# Patient Record
Sex: Male | Born: 1945 | Race: White | Hispanic: No | Marital: Married | Smoking: Former smoker
Health system: Southern US, Community
[De-identification: ages and names within clinical notes are randomized; demographics above are authoritative.]

## PROBLEM LIST (undated history)

## (undated) DIAGNOSIS — G4730 Sleep apnea, unspecified: Secondary | ICD-10-CM

## (undated) DIAGNOSIS — N200 Calculus of kidney: Secondary | ICD-10-CM

## (undated) DIAGNOSIS — N529 Male erectile dysfunction, unspecified: Secondary | ICD-10-CM

## (undated) DIAGNOSIS — E785 Hyperlipidemia, unspecified: Secondary | ICD-10-CM

## (undated) DIAGNOSIS — K635 Polyp of colon: Secondary | ICD-10-CM

## (undated) DIAGNOSIS — I10 Essential (primary) hypertension: Secondary | ICD-10-CM

## (undated) DIAGNOSIS — T7840XA Allergy, unspecified, initial encounter: Secondary | ICD-10-CM

## (undated) DIAGNOSIS — T148XXA Other injury of unspecified body region, initial encounter: Secondary | ICD-10-CM

## (undated) DIAGNOSIS — M7501 Adhesive capsulitis of right shoulder: Secondary | ICD-10-CM

## (undated) DIAGNOSIS — Z8619 Personal history of other infectious and parasitic diseases: Secondary | ICD-10-CM

## (undated) HISTORY — DX: Essential (primary) hypertension: I10

## (undated) HISTORY — DX: Adhesive capsulitis of right shoulder: M75.01

## (undated) HISTORY — DX: Polyp of colon: K63.5

## (undated) HISTORY — DX: Personal history of other infectious and parasitic diseases: Z86.19

## (undated) HISTORY — DX: Calculus of kidney: N20.0

## (undated) HISTORY — DX: Sleep apnea, unspecified: G47.30

## (undated) HISTORY — DX: Allergy, unspecified, initial encounter: T78.40XA

## (undated) HISTORY — DX: Other injury of unspecified body region, initial encounter: T14.8XXA

## (undated) HISTORY — DX: Male erectile dysfunction, unspecified: N52.9

## (undated) HISTORY — DX: Hyperlipidemia, unspecified: E78.5

---
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
## 2000-06-30 ENCOUNTER — Other Ambulatory Visit: Admission: RE | Admit: 2000-06-30 | Discharge: 2000-06-30 | Payer: Self-pay | Admitting: Internal Medicine

## 2000-09-12 ENCOUNTER — Encounter: Admission: RE | Admit: 2000-09-12 | Discharge: 2000-12-11 | Payer: Self-pay | Admitting: Internal Medicine

## 2000-09-26 ENCOUNTER — Ambulatory Visit (HOSPITAL_COMMUNITY): Admission: RE | Admit: 2000-09-26 | Discharge: 2000-09-26 | Payer: Self-pay | Admitting: Internal Medicine

## 2002-10-26 ENCOUNTER — Other Ambulatory Visit: Admission: RE | Admit: 2002-10-26 | Discharge: 2002-10-26 | Payer: Self-pay | Admitting: Family Medicine

## 2006-03-23 ENCOUNTER — Ambulatory Visit: Payer: Self-pay | Admitting: Internal Medicine

## 2006-03-30 ENCOUNTER — Ambulatory Visit: Payer: Self-pay | Admitting: Internal Medicine

## 2006-03-30 LAB — HM COLONOSCOPY: HM Colonoscopy: NORMAL

## 2010-06-07 HISTORY — PX: ROTATOR CUFF REPAIR: SHX139

## 2010-07-07 LAB — BASIC METABOLIC PANEL
Chloride: 102 mEq/L (ref 96–112)
GFR calc Af Amer: >60 mL/min (ref >60)
Potassium: 4.5 mEq/L (ref 3.5–5.1)
Sodium: 138 mEq/L (ref 135–145)

## 2010-07-10 ENCOUNTER — Ambulatory Visit (HOSPITAL_BASED_OUTPATIENT_CLINIC_OR_DEPARTMENT_OTHER)
Admission: RE | Admit: 2010-07-10 | Discharge: 2010-07-10 | Disposition: A | Discharge status: Home / Self Care | Payer: BC Managed Care – PPO | Attending: Orthopedic Surgery | Admitting: Orthopedic Surgery

## 2010-07-10 DIAGNOSIS — E119 Type 2 diabetes mellitus without complications: Secondary | ICD-10-CM | POA: Insufficient documentation

## 2010-07-10 DIAGNOSIS — G4733 Obstructive sleep apnea (adult) (pediatric): Secondary | ICD-10-CM | POA: Insufficient documentation

## 2010-07-10 DIAGNOSIS — M7500 Adhesive capsulitis of unspecified shoulder: Secondary | ICD-10-CM | POA: Insufficient documentation

## 2010-07-10 DIAGNOSIS — M67919 Unspecified disorder of synovium and tendon, unspecified shoulder: Secondary | ICD-10-CM | POA: Insufficient documentation

## 2010-07-10 DIAGNOSIS — Z01810 Encounter for preprocedural cardiovascular examination: Secondary | ICD-10-CM | POA: Insufficient documentation

## 2010-07-10 DIAGNOSIS — M719 Bursopathy, unspecified: Secondary | ICD-10-CM | POA: Insufficient documentation

## 2010-07-10 DIAGNOSIS — Z01812 Encounter for preprocedural laboratory examination: Secondary | ICD-10-CM | POA: Insufficient documentation

## 2010-07-10 DIAGNOSIS — I10 Essential (primary) hypertension: Secondary | ICD-10-CM | POA: Insufficient documentation

## 2010-07-10 DIAGNOSIS — M25819 Other specified joint disorders, unspecified shoulder: Secondary | ICD-10-CM | POA: Insufficient documentation

## 2010-07-13 LAB — GLUCOSE, CAPILLARY
Glucose-Capillary: 150 mg/dL — ABNORMAL HIGH (ref 70–99)
Glucose-Capillary: 139 mg/dL — ABNORMAL HIGH (ref 70–99)

## 2010-07-13 LAB — POCT HEMOGLOBIN-HEMACUE: Hemoglobin: 13.8 g/dL (ref 13.0–17.0)

## 2010-07-21 NOTE — Op Note (Signed)
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  POSTOPERATIVE DIAGNOSES:  Right shoulder adhesive capsulitis, impingement syndrome, high-grade rotator cuff tendinopathy with 85% full- thickness tear of the leading edges of supraspinatus, diffuse labral fraying, anteriorly and superiorly.  OPERATIVE PROCEDURE:  Right shoulder arthroscopy with rotator cuff repair with acromioplasty and debridement of the labrum and manipulation under anesthesia.  PREOPERATIVE INDICATIONS:  [name] is a 65 year old gentleman who had right shoulder pain with limited motion.  He had multiple injections and prolonged rehabilitation with ongoing symptoms.  He elected to undergo the above-named procedures.  The risks, benefits, and alternatives were discussed before the procedure including but not limited to risks of infection, bleeding, nerve injury, recurrent stiffness, recurrent rotator cuff tearing, ongoing weakness, incomplete relief of symptoms, cardiopulmonary complications, among others and he is willing to proceed.  OPERATIVE FINDINGS:  The glenohumeral articular cartilage was essentially normal.  The biceps had some fraying, but was overall intact.  The anterior and superior labrum had substantial fraying.  The anterior leading edge of the supraspinatus had fairly significant degenerative changes with very poor tissue quality and 85% full- thickness tearing in a small portion.  The undersurface of the CA ligament was very frayed and had evidence for wear.   Diffusely, there was synovitis and erythema and injection of the capsule consistent with adhesive capsulitis.  Exam under anesthesia demonstrated forward flexion to 160 degrees, and after manipulation, I could get him up to 180 degrees.  I had palpable and audible lysis of adhesions.  External rotation after manipulation was to 80 degrees, and prior was to 30 degrees.  I could also internally rotate him to 80 degrees with the arm abducted to 90 degrees.  OPERATIVE IMPLANTS:  I used an Arthrex PEEK 5.5-mm SwiveLock x1.  OPERATIVE PROCEDURE:  The patient was brought to the operating room and placed in a supine position.  IV antibiotics were given.  General anesthesia was administered.  Manipulation under anesthesia was performed with the above-named findings.  Diagnostic arthroscopy was carried out with the above-named findings.  The arthroscopic shaver was used to debride the labrum anteriorly as well as superiorly as well as to debride the undersurface of the anterior supraspinatus.  The posterior supraspinatus also looked like fairly significant tendinopathy, although this was very well attached.  I then turned my attention to subacromial space.  Complete bursectomy was performed, and he had a type 2 acromion that I smoothened with a bur.  CA ligament was released.  After completing the bursectomy, I exposed the tuberosity as well as the tear.  I had previously marked the tear with a PDS suture.  I had to take down  a small veil of tissue from the superior side and once the tear had been exposed I placed appropriate cannulas and then used an inverted mattress FiberTape as well as a single FiberLink as a third strand in order to prevent dog- ear.  I then punched and placed the anchor.  The anchor was placed to the lateral portal, and had a slightly more parallel than usual angle of the tack, however, it had excellent bone quality and fixation and the cuff was well apposed down to bone.   I then irrigated the shoulder and removed the arthroscopic instruments and closed the portals with Monocryl followed by Steri-Strips and sterile gauze.  He was awakened and returned to PACU in stable and satisfactory condition.  He did have a preoperative block and I did also inject his posterior portal for additional pain control.  There were no complications.  He tolerated the procedure well.     Eulas Post, MD     JPL/MEDQ  D:  07/10/2010  T:  07/11/2010  Job:  [id number]  Electronically Signed by Teryl Lucy MD on 07/21/2010 11:53:58 AM

## 2010-10-01 ENCOUNTER — Encounter (INDEPENDENT_AMBULATORY_CARE_PROVIDER_SITE_OTHER): Payer: PRIVATE HEALTH INSURANCE | Admitting: Family Medicine

## 2010-10-01 DIAGNOSIS — Z79899 Other long term (current) drug therapy: Secondary | ICD-10-CM

## 2010-10-01 DIAGNOSIS — E782 Mixed hyperlipidemia: Secondary | ICD-10-CM

## 2010-10-01 DIAGNOSIS — Z0000 Encounter for general adult medical examination without abnormal findings: Secondary | ICD-10-CM

## 2010-10-01 DIAGNOSIS — Z125 Encounter for screening for malignant neoplasm of prostate: Secondary | ICD-10-CM

## 2010-10-01 DIAGNOSIS — I10 Essential (primary) hypertension: Secondary | ICD-10-CM

## 2010-10-28 ENCOUNTER — Ambulatory Visit: Payer: PRIVATE HEALTH INSURANCE | Admitting: Family Medicine

## 2010-10-30 ENCOUNTER — Encounter: Payer: Self-pay | Admitting: *Deleted

## 2010-11-05 ENCOUNTER — Encounter: Payer: Self-pay | Admitting: Family Medicine

## 2010-11-05 ENCOUNTER — Ambulatory Visit (INDEPENDENT_AMBULATORY_CARE_PROVIDER_SITE_OTHER): Payer: PRIVATE HEALTH INSURANCE | Admitting: Family Medicine

## 2010-11-05 VITALS — BP 124/62 | HR 72 | Ht 68.0 in | Wt 199.0 lb

## 2010-11-05 DIAGNOSIS — E7800 Pure hypercholesterolemia, unspecified: Secondary | ICD-10-CM | POA: Insufficient documentation

## 2010-11-05 DIAGNOSIS — I10 Essential (primary) hypertension: Secondary | ICD-10-CM | POA: Insufficient documentation

## 2010-11-05 MED ORDER — AMLODIPINE BESYLATE 5 MG PO TABS: 5.0000 mg | ORAL_TABLET | Freq: Every day | ORAL | Status: DC

## 2010-11-05 NOTE — Patient Instructions (Signed)
Continue monitoring BP.  If consistently <110 systolic, then may decrease to 1/2 tablet of the amlodipine.  If periodically BP is over 130, then do not decrease.  Goal BP is always to be <130/80

## 2010-11-05 NOTE — Progress Notes (Signed)
Subjective:    Patient ID: [name], male    DOB: 08-20-1945, 65 y.o.   MRN: [id number]  HPI Patient presents for follow up on hypertension. Amlodipine 5mg  was added to his Losartan HCT at his last visit due to his BP's being poorly controlled.  BP's have been 130/70 when he checks it, although lower at endocrinologist office.  A1c was 5.9 and BP was 105/65 at Dr. Daune Perch office last week.  He ran out of medicine 3-4 days ago.  Denies dizziness, headaches, fatigue, edema or constipation.  He recently moved, and is much less stressed now.  Exercises every morning.  Past Medical History  Diagnosis Date  . Diabetes mellitus   . Hypertension   . Hyperlipidemia elevated chol/TG  . Sleep apnea on CPAP  . Allergy   . Erectile dysfunction   . Kidney stones 8/07 DrKimbrough  . History of shingles mid 90's  . Adhesive capsulitis of right shoulder     Past Surgical History  Procedure Date  . Tonsillectomy and adenoidectomy as a child  . Rotator cuff repair right 07/2010    History   Social History  . Marital Status: Single    Spouse Name: N/A    Number of Children: N/A  . Years of Education: N/A   Occupational History  . Not on file.   Social History Main Topics  . Smoking status: Former Smoker    Quit date: 10/01/1986  . Smokeless tobacco: Not on file  . Alcohol Use: Yes     daily, 2 drinks, 1 scotch and 1 glass wine  . Drug Use: No  . Sexually Active: Not on file   Other Topics Concern  . Not on file   Social History Narrative  . No narrative on file    Family History  Problem Relation Age of Onset  . Diabetes Mother   . Hyperlipidemia Mother   . Hypertension Mother   . Alzheimer's disease Mother   . Diabetes Father   . Hypertension Father   . Hyperlipidemia Brother   . Hypertension Maternal Grandmother   . Stroke Maternal Grandmother   . Heart attack Maternal Grandfather   . Prostate cancer Paternal Grandfather   . Hypertension Brother     Current  outpatient prescriptions:amLODipine (NORVASC) 5 MG tablet, Take 1 tablet (5 mg total) by mouth daily., Disp: 90 tablet, Rfl: 1;  aspirin 81 MG tablet, Take 81 mg by mouth daily.  , Disp: , Rfl: ;  fenofibrate (TRICOR) 145 MG tablet, Take 145 mg by mouth daily.  , Disp: , Rfl: ;  insulin aspart (NOVOLOG) 100 UNIT/ML injection, Inject 16 Units into the skin 2 (two) times daily.  , Disp: , Rfl:  insulin NPH (HUMULIN N,NOVOLIN N) 100 UNIT/ML injection, Inject 32 Units into the skin 2 (two) times daily.  , Disp: , Rfl: ;  losartan-hydrochlorothiazide (HYZAAR) 100-12.5 MG per tablet, Take 1 tablet by mouth daily.  , Disp: , Rfl: ;  metFORMIN (GLUCOPHAGE) 1000 MG tablet, Take 1,000 mg by mouth 2 (two) times daily with a meal.  , Disp: , Rfl: ;  rosuvastatin (CRESTOR) 10 MG tablet, Take 10 mg by mouth daily.  , Disp: , Rfl:  vardenafil (LEVITRA) 20 MG tablet, Take 20 mg by mouth daily as needed.  , Disp: , Rfl: ;  DISCONTD: amLODipine (NORVASC) 5 MG tablet, Take 5 mg by mouth daily.  , Disp: , Rfl: ;  DISCONTD: losartan-hydrochlorothiazide (HYZAAR) 100-12.5 MG per tablet, Take 1  tablet by mouth daily.  , Disp: , Rfl: ;  DISCONTD: valsartan-hydrochlorothiazide (DIOVAN-HCT) 160-12.5 MG per tablet, Take 1 tablet by mouth daily.  , Disp: , Rfl:   No Known Allergies  Review of Systems Denies headaches, dizziness, chest pain, fever, URI symptoms, SOB, edema, GI changes or other concerns    Objective:   Physical Exam BP 124/62  Pulse 72  Ht 5\' 8"  (1.727 m)  Wt 199 lb (90.266 kg)  BMI 30.26 kg/m2 Pleasant, well developed male in no distress Heart:  Regular rate and rhythm without murmurs Lungs:  Clear bilaterally Extremities:  No edema     Assessment & Plan:   1. Essential hypertension, benign  amLODipine (NORVASC) 5 MG tablet   improved control with addition of amlodipine.  Goal <130/80.  If consistently <110 systolic, can try cutting amlodipine dose in 1/2  2. Pure hypercholesterolemia  Hepatic function  panel, Lipid panel   Follow up in 5 months (6 months from last labs) with labs prior for lipids and HTN

## 2011-04-07 ENCOUNTER — Other Ambulatory Visit: Payer: PRIVATE HEALTH INSURANCE

## 2011-04-07 DIAGNOSIS — E7800 Pure hypercholesterolemia, unspecified: Secondary | ICD-10-CM

## 2011-04-07 LAB — LIPID PANEL
HDL: 34 mg/dL — ABNORMAL LOW (ref >39)
Triglycerides: 123 mg/dL (ref <150)

## 2011-04-07 LAB — HEPATIC FUNCTION PANEL
Albumin: 4.4 g/dL (ref 3.5–5.2)
Alkaline Phosphatase: 30 U/L — ABNORMAL LOW (ref 39–117)
Total Protein: 6.2 g/dL (ref 6.0–8.3)

## 2011-04-15 ENCOUNTER — Ambulatory Visit (INDEPENDENT_AMBULATORY_CARE_PROVIDER_SITE_OTHER): Payer: PRIVATE HEALTH INSURANCE | Admitting: Family Medicine

## 2011-04-15 ENCOUNTER — Encounter: Payer: Self-pay | Admitting: Family Medicine

## 2011-04-15 VITALS — BP 110/68 | HR 64 | Ht 67.0 in | Wt 204.0 lb

## 2011-04-15 DIAGNOSIS — N529 Male erectile dysfunction, unspecified: Secondary | ICD-10-CM | POA: Insufficient documentation

## 2011-04-15 DIAGNOSIS — E1165 Type 2 diabetes mellitus with hyperglycemia: Secondary | ICD-10-CM | POA: Insufficient documentation

## 2011-04-15 DIAGNOSIS — E7800 Pure hypercholesterolemia, unspecified: Secondary | ICD-10-CM

## 2011-04-15 DIAGNOSIS — Z9989 Dependence on other enabling machines and devices: Secondary | ICD-10-CM | POA: Insufficient documentation

## 2011-04-15 DIAGNOSIS — I10 Essential (primary) hypertension: Secondary | ICD-10-CM

## 2011-04-15 DIAGNOSIS — E119 Type 2 diabetes mellitus without complications: Secondary | ICD-10-CM | POA: Insufficient documentation

## 2011-04-15 DIAGNOSIS — Z23 Encounter for immunization: Secondary | ICD-10-CM

## 2011-04-15 DIAGNOSIS — G4733 Obstructive sleep apnea (adult) (pediatric): Secondary | ICD-10-CM

## 2011-04-15 MED ORDER — TADALAFIL 5 MG PO TABS: 5.0000 mg | ORAL_TABLET | Freq: Every day | ORAL | Status: AC | PRN

## 2011-04-15 MED ORDER — SILDENAFIL CITRATE 100 MG PO TABS: 100.0000 mg | ORAL_TABLET | ORAL | Status: DC | PRN

## 2011-04-15 NOTE — Patient Instructions (Addendum)
Discussed options for borderline cholesterol--Decided to continue with current regimen and re-check in 6 months. If HDL still low, ratio elevated (>4), consider switching from Tricor to Niaspan (and continue Crestor).  Re-check labs a month later, and make adjustments if needed.  Erectile dysfunction:   If medications aren't effective in treating ED, consider referral to urologist to discuss other options.

## 2011-04-15 NOTE — Progress Notes (Signed)
Patient presents for 6 month f/u on hypertension and lipids.  Had labs done last week.  Hyperlipidemia:  Patient is compliant with medications.  Denies side effects.  Walks 3-4 miles five days/week.  Doesn't tolerate fish oil due to indigestion.  Hypertension follow-up:  Blood pressures elsewhere are 110/70 at pharmacy.  Denies dizziness, headaches, chest pain.  Denies side effects of medications.  DM--is monitored by Dr. Sharl Ma.  Has appointment next week.  Last A1c was 5.8  Erectile dysfunction--Levitra doesn't seem to be working.  Taking a full pill 1-1.5 hours prior, and not getting much response.  Hasn't tried Cialis or Viagra.  Concern for heart disease.  Got newsletter from [facility] stating that ED can sometimes precede heart disease by a few years.  He is wondering if he needs stress tests.  Previously seen at [facility], and had normal stress test.  This was approximately 5 years ago.  He is very active, and denies any dyspnea on exertion, chest pain or other changes with exercise.  In fact, he has increased his exercise without problems.  Sleep apnea:  His machine is 64 years old.  Compliant with CPAP use.  Looking into new machine.  Needs prescription for [facility].  Past Medical History  Diagnosis Date  . Diabetes mellitus   . Hypertension   . Hyperlipidemia elevated chol/TG  . Sleep apnea on CPAP  . Allergy   . Erectile dysfunction   . Kidney stones 8/07 DrKimbrough  . History of shingles mid 90's  . Adhesive capsulitis of right shoulder     Past Surgical History  Procedure Date  . Tonsillectomy and adenoidectomy as a child  . Rotator cuff repair right 07/2010    History   Social History  . Marital Status: Single    Spouse Name: N/A    Number of Children: N/A  . Years of Education: N/A   Occupational History  . Not on file.   Social History Main Topics  . Smoking status: Former Smoker    Quit date: 10/01/1986  . Smokeless  tobacco: Not on file  . Alcohol Use: Yes     daily, 2 drinks, 1 scotch and 1 glass wine  . Drug Use: No  . Sexually Active: Not on file   Other Topics Concern  . Not on file   Social History Narrative  . No narrative on file    Family History  Problem Relation Age of Onset  . Diabetes Mother   . Hyperlipidemia Mother   . Hypertension Mother   . Alzheimer's disease Mother   . Diabetes Father   . Hypertension Father   . Hyperlipidemia Brother   . Hypertension Maternal Grandmother   . Stroke Maternal Grandmother   . Heart attack Maternal Grandfather   . Prostate cancer Paternal Grandfather   . Hypertension Brother    Current Outpatient Prescriptions on File Prior to Visit  Medication Sig Dispense Refill  . amLODipine (NORVASC) 5 MG tablet Take 1 tablet (5 mg total) by mouth daily.  90 tablet  1  . aspirin 81 MG tablet Take 81 mg by mouth daily.        . fenofibrate (TRICOR) 145 MG tablet Take 145 mg by mouth daily.        . insulin aspart (NOVOLOG) 100 UNIT/ML injection Inject 16 Units into the skin 2 (two) times daily.        . insulin NPH (HUMULIN N,NOVOLIN N) 100 UNIT/ML injection Inject 32  Units into the skin 2 (two) times daily.        Marland Kitchen losartan-hydrochlorothiazide (HYZAAR) 100-12.5 MG per tablet Take 1 tablet by mouth daily.        . metFORMIN (GLUCOPHAGE) 1000 MG tablet Take 1,000 mg by mouth 2 (two) times daily with a meal.        . rosuvastatin (CRESTOR) 10 MG tablet Take 10 mg by mouth daily.        . vardenafil (LEVITRA) 20 MG tablet Take 20 mg by mouth daily as needed.         No Known Allergies  ROS:  Denies fevers, URI symptoms, cough, shortness of breath, chest pain, dizziness, GI complaints or other concerns.  See HPI  PHYSICAL EXAM: BP 110/68  Pulse 64  Ht 5\' 7"  (1.702 m)  Wt 204 lb (92.534 kg)  BMI 31.95 kg/m2 Well developed, pleasant male in no distress Neck: no lymphadenopathy, thyromegaly or carotid bruit Heart: regular rate and rhythm without  murmur Lungs: clear bilaterally Extremities: no edema Skin: no rash  Lab Results  Component Value Date   CHOL 136 04/07/2011   HDL 34* 04/07/2011   LDLCALC 77 04/07/2011   TRIG 123 04/07/2011   CHOLHDL 4.0 04/07/2011   LFT's were normal  ASSESSMENT/PLAN:  1. Pure hypercholesterolemia     low HDL, borderline chol/HDL ratio.   2. Need for prophylactic vaccination and inoculation against influenza  Flu vaccine greater than or equal to 3yo preservative free IM  3. Type II or unspecified type diabetes mellitus without mention of complication, not stated as uncontrolled     controlled.  Managed by Dr. Sharl Ma.  Patient was given copies of labs to show him at visit next week  4. Erectile dysfunction  tadalafil (CIALIS) 5 MG tablet, sildenafil (VIAGRA) 100 MG tablet   No response to Levitra.  Given samples of Viagra and Rx with voucher for trial of Cialis.  Patient can call for Rx for whichever is most effective  5. OSA on CPAP     Rx written for new CPAP machine and supplies  6. Essential hypertension, benign     well controlled   Discussed options for borderline cholesterol--Decided to continue with current regimen and re-check in 6 months. If HDL still low, ratio elevated (>4), consider switching from Tricor to Niaspan (and continue Crestor).  Re-check labs a month later, and make adjustments if needed  HTN -- well controlled  Discussed his risk for heart disease, all of which are being adequately controlled.  With lack of exertional symptoms or other concern, I don't feel he needs a routine followup stress test.  Discussed signs/symptoms of angina, and to call immediately should he have more problems with exercise  Sleep apnea.  Rx written for CPAP machine and supplies   ED--trial of other medications.  If medications aren't effective in treating ED, consider referral to urologist to discuss other options.  F/u 6 months at CPE

## 2011-04-19 DIAGNOSIS — Z23 Encounter for immunization: Secondary | ICD-10-CM

## 2011-05-13 ENCOUNTER — Encounter: Payer: Self-pay | Admitting: Internal Medicine

## 2011-05-17 ENCOUNTER — Telehealth: Payer: Self-pay | Admitting: *Deleted

## 2011-05-17 DIAGNOSIS — N529 Male erectile dysfunction, unspecified: Secondary | ICD-10-CM

## 2011-05-17 MED ORDER — SILDENAFIL CITRATE 100 MG PO TABS: ORAL_TABLET | ORAL | Status: DC

## 2011-05-17 NOTE — Telephone Encounter (Signed)
Patient called and stated he was given sample of Viagra and would like an rx called into [facility] [address].

## 2011-05-17 NOTE — Telephone Encounter (Signed)
E-rxd

## 2011-06-09 ENCOUNTER — Telehealth: Payer: Self-pay | Admitting: Internal Medicine

## 2011-06-09 DIAGNOSIS — I10 Essential (primary) hypertension: Secondary | ICD-10-CM

## 2011-06-09 MED ORDER — AMLODIPINE BESYLATE 5 MG PO TABS: 5.0000 mg | ORAL_TABLET | Freq: Every day | ORAL | Status: DC

## 2011-06-09 NOTE — Telephone Encounter (Signed)
Sent med in 

## 2011-06-23 ENCOUNTER — Ambulatory Visit (AMBULATORY_SURGERY_CENTER): Payer: PRIVATE HEALTH INSURANCE | Admitting: *Deleted

## 2011-06-23 VITALS — Ht 68.0 in | Wt 205.3 lb

## 2011-06-23 DIAGNOSIS — Z1211 Encounter for screening for malignant neoplasm of colon: Secondary | ICD-10-CM

## 2011-06-23 MED ORDER — PEG-KCL-NACL-NASULF-NA ASC-C 100 G PO SOLR: ORAL | Status: DC

## 2011-06-30 ENCOUNTER — Telehealth: Payer: Self-pay | Admitting: Internal Medicine

## 2011-06-30 DIAGNOSIS — I10 Essential (primary) hypertension: Secondary | ICD-10-CM

## 2011-06-30 DIAGNOSIS — E7800 Pure hypercholesterolemia, unspecified: Secondary | ICD-10-CM

## 2011-06-30 MED ORDER — LOSARTAN POTASSIUM-HCTZ 100-12.5 MG PO TABS: 1.0000 | ORAL_TABLET | Freq: Every day | ORAL | Status: DC

## 2011-06-30 MED ORDER — ROSUVASTATIN CALCIUM 10 MG PO TABS: 10.0000 mg | ORAL_TABLET | Freq: Every day | ORAL | Status: DC

## 2011-06-30 NOTE — Telephone Encounter (Signed)
E-rxd

## 2011-07-07 ENCOUNTER — Ambulatory Visit (AMBULATORY_SURGERY_CENTER): Payer: PRIVATE HEALTH INSURANCE | Admitting: Internal Medicine

## 2011-07-07 ENCOUNTER — Encounter: Payer: Self-pay | Admitting: Internal Medicine

## 2011-07-07 VITALS — BP 145/86 | HR 61 | Temp 97.7°F | Resp 15 | Ht 68.0 in | Wt 205.0 lb

## 2011-07-07 DIAGNOSIS — D126 Benign neoplasm of colon, unspecified: Secondary | ICD-10-CM

## 2011-07-07 DIAGNOSIS — K635 Polyp of colon: Secondary | ICD-10-CM

## 2011-07-07 DIAGNOSIS — Z86010 Personal history of colonic polyps: Secondary | ICD-10-CM

## 2011-07-07 DIAGNOSIS — Z1211 Encounter for screening for malignant neoplasm of colon: Secondary | ICD-10-CM

## 2011-07-07 HISTORY — DX: Polyp of colon: K63.5

## 2011-07-07 LAB — GLUCOSE, CAPILLARY
Glucose-Capillary: 121 mg/dL — ABNORMAL HIGH (ref 70–99)
Glucose-Capillary: 134 mg/dL — ABNORMAL HIGH (ref 70–99)

## 2011-07-07 MED ORDER — SODIUM CHLORIDE 0.9 % IV SOLN: 500.0000 mL | INTRAVENOUS | Status: DC

## 2011-07-07 NOTE — Progress Notes (Signed)
Propofol administered continuously during procedure per [facility] camp crna. See scanned intra procedure report. ewm

## 2011-07-07 NOTE — Op Note (Signed)
[facility] [address] [place], [place]  [address]  COLONOSCOPY PROCEDURE REPORT  PATIENT:  [name], [name]  MR#:  [id number] BIRTHDATE:  [birth year], 65 yrs. old  GENDER:  male ENDOSCOPIST:  Hedwig Morton. Juanda Chance, MD REF. BY:  Joselyn Arrow, M.D. PROCEDURE DATE:  07/07/2011 PROCEDURE:  Colonoscopy with biopsy and snare polypectomy, Colonoscopy with snare polypectomy ASA CLASS:  Class II INDICATIONS:  hyperplastic polyp in 2002, adenomatous polyps in 2007 MEDICATIONS:   MAC sedation, administered by CRNA, propofol (Diprivan) 450 mg  DESCRIPTION OF PROCEDURE:   After the risks and benefits and of the procedure were explained, informed consent was obtained. Digital rectal exam was performed and revealed no rectal masses. The LB CF-[id number] endoscope was introduced through the anus and advanced to the cecum, which was identified by both the appendix and ileocecal valve.  The quality of the prep was good, using MoviPrep.  The instrument was then slowly withdrawn as the colon was fully examined. <<PROCEDUREIMAGES>>  FINDINGS:  There were multiple polyps identified and removed. throughout the colon. 6 polyps, 3mm to 10 mm, pedunculated polyp close to the cecum The polyp was removed using cold biopsy forceps. Polyps were snared without cautery. Retrieval was successful (see image2, image5, image6, and image7). snare polyp This was otherwise a normal examination of the colon (see image8, image4, image3, and image2).   Retroflexed views in the rectum revealed no abnormalities.    The scope was then withdrawn from the patient and the procedure completed.  COMPLICATIONS:  None ENDOSCOPIC IMPRESSION: 1) Polyps, multiple throughout the colon 2) Otherwise normal examination RECOMMENDATIONS: 1) Await pathology results 2) High fiber diet. hold ASA for 2 weeks  REPEAT EXAM:  In 5 year(s) for.  ______________________________ Hedwig Morton. Juanda Chance, MD  CC:  n. eSIGNED:   Hedwig Morton.  Amzie Sillas at 07/07/2011 08:39 AM  Lynnda Child, [phone]

## 2011-07-07 NOTE — Progress Notes (Signed)
Patient did not experience any of the following events: a burn prior to discharge; a fall within the facility; wrong site/side/patient/procedure/implant event; or a hospital transfer or hospital admission upon discharge from the facility. (G8907) Patient did not have preoperative order for IV antibiotic SSI prophylaxis. (G8918)  

## 2011-07-07 NOTE — Patient Instructions (Signed)
Discharge instructions given with verbal understanding. Handout on polyps given. Resume previous medications. Hold aspsirin for two weeks.

## 2011-07-08 ENCOUNTER — Telehealth: Payer: Self-pay | Admitting: *Deleted

## 2011-07-08 NOTE — Telephone Encounter (Signed)
Left message per permission given in recovery yesterday on number given.ewm

## 2011-07-12 ENCOUNTER — Encounter: Payer: Self-pay | Admitting: Family Medicine

## 2011-07-12 ENCOUNTER — Encounter: Payer: Self-pay | Admitting: Internal Medicine

## 2011-07-12 DIAGNOSIS — D126 Benign neoplasm of colon, unspecified: Secondary | ICD-10-CM | POA: Insufficient documentation

## 2011-07-13 ENCOUNTER — Telehealth: Payer: Self-pay | Admitting: Family Medicine

## 2011-07-13 ENCOUNTER — Encounter: Payer: Self-pay | Admitting: *Deleted

## 2011-07-13 NOTE — Telephone Encounter (Signed)
Pt came in with a form from his insurance company requesting that his crestor be changed to atorvastatin tab 40 mg. Pt uses [facility].  Form sent back with pt's chart.

## 2011-07-14 ENCOUNTER — Telehealth: Payer: Self-pay | Admitting: *Deleted

## 2011-07-14 NOTE — Telephone Encounter (Signed)
Advise pt--form reviewed.  This was only a suggestion by his insurance, to help him save money, not that they weren't going to cover his Crestor anymore.  If he has a copay card for Crestor, the difference likely is copay of $10 for lipitor generic, vs probably $25 for Crestor (depending on his insurance and the copay card, with form stating his current copay is $40).  We do know that the combination of his current lipid lowering meds is working well.  There is some increased risks of side effects and liver toxicity using the high dose of tricor along with statins, but I've been comfortable with this knowing that he is on low dose Crestor, and LFT's have been normal.  There is slightly higher risk going to the higher dose of lipitor to be equivalent with what he is getting.  I recommend leaving things as is, but if he would like to change for cost purposes, then can change from Crestor 10 to Lipitor 40, but would need LFT's and lipid panel done in 8 weeks to ensure that it is working adequately without causing harm

## 2011-07-14 NOTE — Telephone Encounter (Signed)
Spoke with patient and he would like to stay on Crestor, he will come to pick up copay card. He will speak further with you at his CPE in 10/2011. He is swtiching over to Medicare and that is why he needed to change, the copay card is not valid with Medicaid or Medicare. He said he will use it in the time being until he sees you in May. Just an FYI.

## 2011-07-15 ENCOUNTER — Telehealth: Payer: Self-pay | Admitting: Internal Medicine

## 2011-07-15 DIAGNOSIS — E782 Mixed hyperlipidemia: Secondary | ICD-10-CM

## 2011-07-15 MED ORDER — FENOFIBRATE 145 MG PO TABS: 145.0000 mg | ORAL_TABLET | Freq: Every day | ORAL | Status: DC

## 2011-07-15 NOTE — Telephone Encounter (Signed)
done

## 2011-10-13 ENCOUNTER — Encounter: Payer: Self-pay | Admitting: Family Medicine

## 2011-10-13 ENCOUNTER — Ambulatory Visit (INDEPENDENT_AMBULATORY_CARE_PROVIDER_SITE_OTHER): Payer: Medicare Other | Admitting: Family Medicine

## 2011-10-13 VITALS — BP 148/88 | HR 72 | Ht 68.5 in | Wt 205.0 lb

## 2011-10-13 DIAGNOSIS — Z125 Encounter for screening for malignant neoplasm of prostate: Secondary | ICD-10-CM

## 2011-10-13 DIAGNOSIS — E782 Mixed hyperlipidemia: Secondary | ICD-10-CM

## 2011-10-13 DIAGNOSIS — Z79899 Other long term (current) drug therapy: Secondary | ICD-10-CM | POA: Diagnosis not present

## 2011-10-13 DIAGNOSIS — Z0000 Encounter for general adult medical examination without abnormal findings: Secondary | ICD-10-CM

## 2011-10-13 DIAGNOSIS — E119 Type 2 diabetes mellitus without complications: Secondary | ICD-10-CM | POA: Diagnosis not present

## 2011-10-13 LAB — POCT URINALYSIS DIPSTICK
Bilirubin, UA: NEGATIVE
Blood, UA: NEGATIVE
Ketones, UA: NEGATIVE
Protein, UA: NEGATIVE
Spec Grav, UA: 1.015
pH, UA: 7

## 2011-10-13 NOTE — Progress Notes (Signed)
[name] is a 66 y.o. male who presents for a complete physical.  This is a Welcome to Medicare to physical. He has no specific concerns:  Other doctors he sees: Dr. Sharl Ma for diabetes.  He recalls that his last A1c was 6.8. Dr. [place] for ophtho Dr. Brodie--gastroenterologist Dr. Jesse Sans Dr. Darrick Grinder dermatologist within the year--may have been someone else though  Smoked 1-1.5 PPD from age [age]. BP's run 120's-130/60-80.  Hasn't taken his meds yet this morning, so BP higher today.  Immunization History  Administered Date(s) Administered  . Influenza Split 04/19/2011  . Pneumococcal Conjugate 06/07/2004  . Td 06/07/2002  . Zoster 04/18/2008   Last colonoscopy: 07/07/11, due again in 5 years Last PSA: 09/2010 Dentist: once yearly Ophtho: once yearly Exercise: walks 3 miles, 6 days/week  Past Medical History  Diagnosis Date  . Diabetes mellitus   . Hypertension   . Hyperlipidemia elevated chol/TG  . Sleep apnea on CPAP  . Allergy   . Erectile dysfunction   . Kidney stones 8/07 DrKimbrough  . History of shingles mid 90's  . 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 Colon polyp 07/07/11    adenomatous and hyperplastic    Past Surgical History  Procedure Date  . Tonsillectomy and adenoidectomy as a child  . Rotator cuff repair right 07/2010    History   Social History  . Marital Status: Married    Spouse Name: N/A    Number of Children: 3  . Years of Education: N/A   Occupational History  . Not on file.   Social History Main Topics  . Smoking status: Former Smoker    Quit date: 10/01/1986  . Smokeless tobacco: Never Used  . Alcohol Use: Yes     daily, 2 drinks, 1 scotch and 1 glass wine  . Drug Use: No  . Sexually Active: Yes -- Male partner(s)   Other Topics Concern  . Not on file   Social History Narrative   Lives with wife, 1 dog.  2 children in [place], 1 in [place], 6 grandchildren.  Retired from Associate Professor    Family  History  Problem Relation Age of Onset  . Diabetes Mother   . Hyperlipidemia Mother   . Hypertension Mother   . Alzheimer's disease Mother   . Diabetes Father   . Hypertension Father   . Hyperlipidemia Brother   . Hypertension Maternal Grandmother   . Stroke Maternal Grandmother   . Heart attack Maternal Grandfather   . Prostate cancer Paternal Grandfather   . Hypertension Brother   . Colon cancer Neg Hx   . Stomach cancer Neg Hx    Current Outpatient Prescriptions on File Prior to Visit  Medication Sig Dispense Refill  . amLODipine (NORVASC) 5 MG tablet Take 1 tablet (5 mg total) by mouth daily.  90 tablet  1  . aspirin 81 MG tablet Take 81 mg by mouth daily.        . fenofibrate (TRICOR) 145 MG tablet Take 1 tablet (145 mg total) by mouth daily.  30 tablet  3  . insulin aspart (NOVOLOG) 100 UNIT/ML injection Inject 16 Units into the skin 2 (two) times daily.        . insulin NPH (HUMULIN N,NOVOLIN N) 100 UNIT/ML injection Inject 38 Units into the skin 2 (two) times daily.       Marland Kitchen losartan-hydrochlorothiazide (HYZAAR) 100-12.5 MG per tablet Take 1 tablet by mouth daily.  90 tablet  1  . metFORMIN (GLUCOPHAGE) 1000 MG tablet Take 1,000 mg by mouth 2 (two) times daily with a meal.        . rosuvastatin (CRESTOR) 10 MG tablet Take 1 tablet (10 mg total) by mouth daily.  90 tablet  1  . sildenafil (VIAGRA) 100 MG tablet Take 1/2 to 1 tablet daily as needed for erectile dysfunction  30 tablet  5  . vardenafil (LEVITRA) 20 MG tablet Take 20 mg by mouth daily as needed.          No Known Allergies  ROS:  The patient denies anorexia, fever, weight changes, headaches,  vision loss, decreased hearing, ear pain, hoarseness, chest pain, palpitations, dizziness, syncope, dyspnea on exertion, cough, swelling, nausea, vomiting, diarrhea, constipation, abdominal pain, melena, hematochezia, indigestion/heartburn, hematuria, incontinence, nocturia, weakened urine stream, dysuria, genital lesions,  joint pains, numbness, tingling, weakness, tremor, suspicious skin lesions, depression, anxiety, abnormal bleeding/bruising, or enlarged lymph nodes. +erectile dysfunction  PHYSICAL EXAM: BP 148/88  Pulse 72  Ht 5' 8.5" (1.74 m)  Wt 205 lb (92.987 kg)  BMI 30.72 kg/m2 144/80 by MD on repeat, RA General Appearance:    Alert, cooperative, no distress, appears stated age  Head:    Normocephalic, without obvious abnormality, atraumatic  Eyes:    PERRL, conjunctiva/corneas clear, EOM's intact, fundi    benign  Ears:    Normal TM's and external ear canals  Nose:   Nares normal, mucosa normal, no drainage or sinus   tenderness  Throat:   Lips, mucosa, and tongue normal; teeth and gums normal  Neck:   Supple, no lymphadenopathy;  thyroid:  no   enlargement/tenderness/nodules; no carotid   bruit or JVD  Back:    Spine nontender, no curvature, ROM normal, no CVA     tenderness  Lungs:     Clear to auscultation bilaterally without wheezes, rales or     ronchi; respirations unlabored  Chest Wall:    No tenderness or deformity   Heart:    Regular rate and rhythm, S1 and S2 normal, no murmur, rub   or gallop  Breast Exam:    No chest wall tenderness, masses or gynecomastia  Abdomen:     Soft, non-tender, nondistended, normoactive bowel sounds,    no masses, no hepatosplenomegaly  Genitalia:    Normal male external genitalia without lesions.  Testicles without masses.  No inguinal hernias.  Rectal:    Normal sphincter tone, no masses or tenderness; guaiac negative stool. Small noninflamed hemorrhoidal tag. Prostate smooth, no nodules, mild-mod enlarged.  Extremities:   No clubbing, cyanosis or edema  Pulses:   2+ and symmetric all extremities  Skin:   Skin color, texture, turgor normal, no rashes; scattered AK's  Lymph nodes:   Cervical, supraclavicular, and axillary nodes normal  Neurologic:   CNII-XII intact, normal strength, sensation and gait; reflexes 2+ and symmetric throughout           Psych:   Normal mood, affect, hygiene and grooming.    ASSESSMENT/PLAN: 1. Welcome to Medicare preventive visit  Visual acuity screening, POCT Urinalysis Dipstick  2. Encounter for initial preventive physical examination covered by Medicare  PSA, Medicare, [place] Screening AAA  3. Encounter for long-term (current) use of other medications  Comprehensive metabolic panel  4. Type II or unspecified type diabetes mellitus without mention of complication, not stated as uncontrolled  Comprehensive metabolic panel  5. Mixed hyperlipidemia  Lipid panel    Discussed PSA screening (risks/benefits), recommended at least  30 minutes of aerobic activity at least 5 days/week; proper sunscreen use reviewed; healthy diet and alcohol recommendations (less than or equal to 2 drinks/day) reviewed; regular seatbelt use; changing batteries in smoke detectors. Self-testicular exams. Immunization recommendations discussed.  Colonoscopy recommendations reviewed--UTD.  Due for pneumovax and TdaP.  Declines vaccines today as he will be going out of town tomorrow.  Prefers to have these when he returns in the fall for his flu shot.  AAA screening due to h/o smoking, and covered by Medicare--will schedule.  End of life issues--has a living will.  Declines further discussion of end of life issues today.  HTN--check at home.  Likely elevated today due to not having taken his meds this morning.  AK's--f/u with derm yearly

## 2011-10-13 NOTE — Patient Instructions (Addendum)
HEALTH MAINTENANCE RECOMMENDATIONS:  It is recommended that you get at least 30 minutes of aerobic exercise at least 5 days/week (for weight loss, you may need as much as 60-90 minutes). This can be any activity that gets your heart rate up. This can be divided in 10-15 minute intervals if needed, but try and build up your endurance at least once a week.  Weight bearing exercise is also recommended twice weekly.  Eat a healthy diet with lots of vegetables, fruits and fiber.  "Colorful" foods have a lot of vitamins (ie green vegetables, tomatoes, red peppers, etc).  Limit sweet tea, regular sodas and alcoholic beverages, all of which has a lot of calories and sugar.  Up to 2 alcoholic drinks daily may be beneficial for men (unless trying to lose weight, watch sugars).  Drink a lot of water.  Sunscreen of at least SPF 30 should be used on all sun-exposed parts of the skin when outside between the hours of 10 am and 4 pm (not just when at beach or pool, but even with exercise, golf, tennis, and yard work!)  Use a sunscreen that says "broad spectrum" so it covers both UVA and UVB rays, and make sure to reapply every 1-2 hours.  Remember to change the batteries in your smoke detectors when changing your clock times in the spring and fall.  Use your seat belt every time you are in a car, and please drive safely and not be distracted with cell phones and texting while driving.  Please continue to monitor your BP regularly--goal is <130/80  See dermatologist yearly for skin check--looks like you have some precancerous lesions that need to be treated  You are due for TdaP and pneumovax (tetanus-pertussis and pneumonia vaccines)--per your request, we will give you these when you return in October, rather than today.  Have a wonderful trip!

## 2011-10-14 ENCOUNTER — Encounter: Payer: Self-pay | Admitting: Family Medicine

## 2011-10-14 LAB — COMPREHENSIVE METABOLIC PANEL
AST: 17 U/L (ref 0–37)
Albumin: 4.6 g/dL (ref 3.5–5.2)
BUN: 18 mg/dL (ref 6–23)
CO2: 25 mEq/L (ref 19–32)
Calcium: 10.1 mg/dL (ref 8.4–10.5)
Chloride: 104 mEq/L (ref 96–112)
Creat: 1.02 mg/dL (ref 0.50–1.35)
Glucose, Bld: 203 mg/dL — ABNORMAL HIGH (ref 70–99)
Potassium: 4.3 mEq/L (ref 3.5–5.3)

## 2011-10-14 LAB — LIPID PANEL
LDL Cholesterol: 72 mg/dL (ref 0–99)
Triglycerides: 128 mg/dL (ref <150)

## 2011-10-29 DIAGNOSIS — E669 Obesity, unspecified: Secondary | ICD-10-CM | POA: Diagnosis not present

## 2011-10-29 DIAGNOSIS — E119 Type 2 diabetes mellitus without complications: Secondary | ICD-10-CM | POA: Diagnosis not present

## 2011-11-02 ENCOUNTER — Ambulatory Visit
Admission: RE | Admit: 2011-11-02 | Discharge: 2011-11-02 | Disposition: A | Discharge status: Home / Self Care | Payer: Medicare Other | Source: Ambulatory Visit | Attending: Family Medicine | Admitting: Family Medicine

## 2011-11-02 DIAGNOSIS — I700 Atherosclerosis of aorta: Secondary | ICD-10-CM | POA: Diagnosis not present

## 2011-11-02 DIAGNOSIS — Z0000 Encounter for general adult medical examination without abnormal findings: Secondary | ICD-10-CM

## 2011-11-02 DIAGNOSIS — Z136 Encounter for screening for cardiovascular disorders: Secondary | ICD-10-CM | POA: Diagnosis not present

## 2011-11-07 ENCOUNTER — Other Ambulatory Visit: Payer: Self-pay | Admitting: Family Medicine

## 2011-12-24 ENCOUNTER — Other Ambulatory Visit: Payer: Self-pay | Admitting: Family Medicine

## 2011-12-31 ENCOUNTER — Other Ambulatory Visit: Payer: Self-pay | Admitting: Family Medicine

## 2012-02-09 DIAGNOSIS — E669 Obesity, unspecified: Secondary | ICD-10-CM | POA: Diagnosis not present

## 2012-02-09 DIAGNOSIS — E119 Type 2 diabetes mellitus without complications: Secondary | ICD-10-CM | POA: Diagnosis not present

## 2012-03-01 ENCOUNTER — Encounter: Payer: Self-pay | Admitting: Family Medicine

## 2012-03-01 ENCOUNTER — Ambulatory Visit (INDEPENDENT_AMBULATORY_CARE_PROVIDER_SITE_OTHER): Payer: Medicare Other | Admitting: Family Medicine

## 2012-03-01 VITALS — BP 120/72 | HR 72 | Ht 68.5 in | Wt 203.0 lb

## 2012-03-01 DIAGNOSIS — E782 Mixed hyperlipidemia: Secondary | ICD-10-CM

## 2012-03-01 DIAGNOSIS — Z125 Encounter for screening for malignant neoplasm of prostate: Secondary | ICD-10-CM

## 2012-03-01 DIAGNOSIS — Z79899 Other long term (current) drug therapy: Secondary | ICD-10-CM | POA: Diagnosis not present

## 2012-03-01 DIAGNOSIS — I10 Essential (primary) hypertension: Secondary | ICD-10-CM | POA: Diagnosis not present

## 2012-03-01 DIAGNOSIS — E119 Type 2 diabetes mellitus without complications: Secondary | ICD-10-CM

## 2012-03-01 DIAGNOSIS — Z23 Encounter for immunization: Secondary | ICD-10-CM

## 2012-03-01 NOTE — Patient Instructions (Addendum)
Labs in November are nonfasting (just liver tests). Return in May for a physical (annual wellness visit plus your routine med check on chronic medical problems).  If you notice any memory concerns, keep a list and bring them to your visit. Use lists

## 2012-03-01 NOTE — Progress Notes (Signed)
Chief Complaint  Patient presents with  . Hypertension    follow up and needs pnuemovax, flu and Tdap vaccines per last OV. Pt prefers to have regular flu vaccine this year as opposed to high dose.   HPI: Hypertension follow-up:  Blood pressures elsewhere are 120/65.  Denies dizziness, headaches, chest pain.  Denies side effects of medications.  Saw Dr. Sharl Ma a few weeks ago.  A1c was 6.2 and insulin dose was adjusted down some. Denies any hypoglycemia since decreasing dose (had a few low sugars prior to lowering dose).  Concerned about Alzheimers due to his mother's history of Alzheimers, wondering if there is anything he should do now.  Due for vaccines (that he declined at his recent CPE)  Past Medical History  Diagnosis Date  . Diabetes mellitus   . Hypertension   . Hyperlipidemia elevated chol/TG  . Sleep apnea on CPAP  . Allergy   . Erectile dysfunction   . Kidney stones 8/07 DrKimbrough  . History of shingles mid 90's  . 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 Colon polyp 07/07/11    adenomatous and hyperplastic   Past Surgical History  Procedure Date  . Tonsillectomy and adenoidectomy as a child  . Rotator cuff repair right 07/2010   History   Social History  . Marital Status: Married    Spouse Name: N/A    Number of Children: 3  . Years of Education: N/A   Occupational History  . Not on file.   Social History Main Topics  . Smoking status: Former Smoker    Quit date: 10/01/1986  . Smokeless tobacco: Never Used  . Alcohol Use: Yes     daily, 2 drinks, 1 scotch and 1 glass wine  . Drug Use: No  . Sexually Active: Yes -- Male partner(s)   Other Topics Concern  . Not on file   Social History Narrative   Lives with wife, 1 dog.  2 children in [place], 1 in [place], 6 grandchildren.  Retired from Associate Professor   ROS:  Denies headaches, dizziness, chest pain, palpitations, shortness of breath, fevers, URI symtpoms, depression, or other complaints.   No rashes  PHYSICAL EXAM: BP 120/72  Pulse 72  Ht 5' 8.5" (1.74 m)  Wt 203 lb (92.08 kg)  BMI 30.42 kg/m2 Well developed male in no distress Neck: no lymphadenopathy or mass Heart: regular rate and rhythm without murmur Lungs: clear bilaterally Abdmoen: soft, nontender, no mass Extremities, no mass Skin: no lesions/rash  ASSESSMENT/PLAN: 1. Need for Tdap vaccination  Tdap vaccine greater than or equal to 7yo IM  2. Need for pneumococcal vaccination  Pneumococcal polysaccharide vaccine 23-valent greater than or equal to 2yo subcutaneous/IM  3. Need for prophylactic vaccination and inoculation against influenza  Flu vaccine greater than or equal to 3yo preservative free IM  4. Essential hypertension, benign  Comprehensive metabolic panel  5. Special screening for malignant neoplasm of prostate  PSA, Medicare  6. Encounter for long-term (current) use of other medications  CBC with Differential, Hepatic function panel, TSH  7. Type II or unspecified type diabetes mellitus without mention of complication, not stated as uncontrolled  Lipid panel, Comprehensive metabolic panel, TSH  8. Mixed hyperlipidemia  Lipid panel, Comprehensive metabolic panel, Hepatic function panel   Return in November for LFT's (6 month f/u from labs, last done in May). Doesn't need lipids done, as lipids have been at goal the last 2 times checked, meds unchanged, compliant  with diet and medications.  Sees Dr. Sharl Ma for DM, and c-met otherwise normal in May.  Lab visit in November CPE (med check "plus") in May 2014 with labs prior--PSA medicare, lipid, c-met

## 2012-03-19 ENCOUNTER — Other Ambulatory Visit: Payer: Self-pay | Admitting: Family Medicine

## 2012-03-27 DIAGNOSIS — E109 Type 1 diabetes mellitus without complications: Secondary | ICD-10-CM | POA: Diagnosis not present

## 2012-03-31 ENCOUNTER — Other Ambulatory Visit: Payer: Self-pay | Admitting: Family Medicine

## 2012-04-26 ENCOUNTER — Other Ambulatory Visit: Payer: Medicare Other

## 2012-04-26 DIAGNOSIS — E782 Mixed hyperlipidemia: Secondary | ICD-10-CM | POA: Diagnosis not present

## 2012-04-26 DIAGNOSIS — Z79899 Other long term (current) drug therapy: Secondary | ICD-10-CM

## 2012-04-27 LAB — HEPATIC FUNCTION PANEL
AST: 19 U/L (ref 0–37)
Albumin: 4.3 g/dL (ref 3.5–5.2)
Alkaline Phosphatase: 33 U/L — ABNORMAL LOW (ref 39–117)
Indirect Bilirubin: 0.5 mg/dL (ref 0.0–0.9)
Total Protein: 6.1 g/dL (ref 6.0–8.3)

## 2012-04-28 ENCOUNTER — Encounter: Payer: Self-pay | Admitting: Family Medicine

## 2012-05-17 DIAGNOSIS — H6120 Impacted cerumen, unspecified ear: Secondary | ICD-10-CM | POA: Diagnosis not present

## 2012-06-24 ENCOUNTER — Other Ambulatory Visit: Payer: Self-pay | Admitting: Family Medicine

## 2012-06-30 ENCOUNTER — Other Ambulatory Visit: Payer: Self-pay | Admitting: Family Medicine

## 2012-06-30 NOTE — Telephone Encounter (Signed)
Is okay to refill this?

## 2012-07-19 ENCOUNTER — Other Ambulatory Visit: Payer: Self-pay | Admitting: Family Medicine

## 2012-09-25 DIAGNOSIS — E119 Type 2 diabetes mellitus without complications: Secondary | ICD-10-CM | POA: Diagnosis not present

## 2012-09-25 DIAGNOSIS — E669 Obesity, unspecified: Secondary | ICD-10-CM | POA: Diagnosis not present

## 2012-09-27 DIAGNOSIS — L570 Actinic keratosis: Secondary | ICD-10-CM | POA: Diagnosis not present

## 2012-09-27 DIAGNOSIS — D235 Other benign neoplasm of skin of trunk: Secondary | ICD-10-CM | POA: Diagnosis not present

## 2012-10-12 ENCOUNTER — Other Ambulatory Visit: Payer: Self-pay | Admitting: Family Medicine

## 2012-10-31 ENCOUNTER — Other Ambulatory Visit: Payer: Medicare Other

## 2012-10-31 ENCOUNTER — Other Ambulatory Visit: Payer: Self-pay | Admitting: Family Medicine

## 2012-10-31 DIAGNOSIS — E119 Type 2 diabetes mellitus without complications: Secondary | ICD-10-CM | POA: Diagnosis not present

## 2012-10-31 DIAGNOSIS — Z125 Encounter for screening for malignant neoplasm of prostate: Secondary | ICD-10-CM | POA: Diagnosis not present

## 2012-10-31 DIAGNOSIS — I10 Essential (primary) hypertension: Secondary | ICD-10-CM

## 2012-10-31 DIAGNOSIS — Z79899 Other long term (current) drug therapy: Secondary | ICD-10-CM | POA: Diagnosis not present

## 2012-10-31 DIAGNOSIS — E782 Mixed hyperlipidemia: Secondary | ICD-10-CM | POA: Diagnosis not present

## 2012-10-31 LAB — CBC WITH DIFFERENTIAL/PLATELET
Basophils Absolute: 0 10*3/uL (ref 0.0–0.1)
Basophils Relative: 0 % (ref 0–1)
Eosinophils Absolute: 0.1 10*3/uL (ref 0.0–0.7)
HCT: 37.1 % — ABNORMAL LOW (ref 39.0–52.0)
MCH: 32.3 pg (ref 26.0–34.0)
MCHC: 34.8 g/dL (ref 30.0–36.0)
Monocytes Absolute: 0.4 10*3/uL (ref 0.1–1.0)
Neutro Abs: 2 10*3/uL (ref 1.7–7.7)
Neutrophils Relative %: 58 % (ref 43–77)
RDW: 14.2 % (ref 11.5–15.5)

## 2012-11-01 LAB — COMPREHENSIVE METABOLIC PANEL
ALT: 19 U/L (ref 0–53)
AST: 15 U/L (ref 0–37)
Albumin: 4.4 g/dL (ref 3.5–5.2)
Alkaline Phosphatase: 30 U/L — ABNORMAL LOW (ref 39–117)
Glucose, Bld: 186 mg/dL — ABNORMAL HIGH (ref 70–99)
Potassium: 4.1 mEq/L (ref 3.5–5.3)
Sodium: 136 mEq/L (ref 135–145)
Total Protein: 6.4 g/dL (ref 6.0–8.3)

## 2012-11-01 LAB — PSA, MEDICARE: PSA: 0.8 ng/mL (ref <=4.00)

## 2012-11-01 LAB — LIPID PANEL
LDL Cholesterol: 70 mg/dL (ref 0–99)
VLDL: 26 mg/dL (ref 0–40)

## 2012-11-01 LAB — TSH: TSH: 1.184 u[IU]/mL (ref 0.350–4.500)

## 2012-11-02 ENCOUNTER — Encounter: Payer: Self-pay | Admitting: Family Medicine

## 2012-11-02 ENCOUNTER — Ambulatory Visit (INDEPENDENT_AMBULATORY_CARE_PROVIDER_SITE_OTHER): Payer: Medicare Other | Admitting: Family Medicine

## 2012-11-02 VITALS — BP 138/80 | HR 68 | Ht 68.5 in | Wt 204.0 lb

## 2012-11-02 DIAGNOSIS — N529 Male erectile dysfunction, unspecified: Secondary | ICD-10-CM

## 2012-11-02 DIAGNOSIS — I10 Essential (primary) hypertension: Secondary | ICD-10-CM

## 2012-11-02 DIAGNOSIS — Z0000 Encounter for general adult medical examination without abnormal findings: Secondary | ICD-10-CM | POA: Diagnosis not present

## 2012-11-02 DIAGNOSIS — E119 Type 2 diabetes mellitus without complications: Secondary | ICD-10-CM

## 2012-11-02 DIAGNOSIS — G4733 Obstructive sleep apnea (adult) (pediatric): Secondary | ICD-10-CM

## 2012-11-02 DIAGNOSIS — E782 Mixed hyperlipidemia: Secondary | ICD-10-CM

## 2012-11-02 MED ORDER — AMLODIPINE BESYLATE 5 MG PO TABS: ORAL_TABLET | ORAL | Status: DC

## 2012-11-02 MED ORDER — FENOFIBRATE 145 MG PO TABS: ORAL_TABLET | ORAL | Status: DC

## 2012-11-02 NOTE — Patient Instructions (Signed)
Continue your current medications.  Try and lose some weight--cutting back on calories in your diet.  Add in some weight bearing exercise for your upper body.  Return if your blood pressures are consistently >130/80.  Continue to periodically check at home.  HEALTH MAINTENANCE RECOMMENDATIONS:  It is recommended that you get at least 30 minutes of aerobic exercise at least 5 days/week (for weight loss, you may need as much as 60-90 minutes). This can be any activity that gets your heart rate up. This can be divided in 10-15 minute intervals if needed, but try and build up your endurance at least once a week.  Weight bearing exercise is also recommended twice weekly.  Eat a healthy diet with lots of vegetables, fruits and fiber.  "Colorful" foods have a lot of vitamins (ie green vegetables, tomatoes, red peppers, etc).  Limit sweet tea, regular sodas and alcoholic beverages, all of which has a lot of calories and sugar.  Up to 2 alcoholic drinks daily may be beneficial for men (unless trying to lose weight, watch sugars).  Drink a lot of water.  Sunscreen of at least SPF 30 should be used on all sun-exposed parts of the skin when outside between the hours of 10 am and 4 pm (not just when at beach or pool, but even with exercise, golf, tennis, and yard work!)  Use a sunscreen that says "broad spectrum" so it covers both UVA and UVB rays, and make sure to reapply every 1-2 hours.  Remember to change the batteries in your smoke detectors when changing your clock times in the spring and fall.  Use your seat belt every time you are in a car, and please drive safely and not be distracted with cell phones and texting while driving.

## 2012-11-02 NOTE — Progress Notes (Signed)
Chief Complaint  Patient presents with  . med check plus    med check plus, had labs tuesday morning   Patient presents for routine med check, along with Annual Wellness Visit.  He had labs prior to his visit.  AWV: Other doctors he sees:  Dr. Sharl Ma for diabetes.  Dr. [place] for ophtho  Dr. Brodie--gastroenterologist  Dr. Jesse Sans  Dr. Lupton--dermatologist, last saw 4 weeks ago  Depression and ADL screen--see questionnaires filled out by pt. No concerns. End of Life issues:  He has a living will.  Hypertension follow-up:  Blood pressures elsewhere are 130/70-80.  Denies dizziness, headaches, chest pain.  Denies side effects of medications.  Hyperlipidemia follow-up:  Patient is reportedly following a low-fat, low cholesterol diet.  Compliant with medications and denies medication side effects  ED: 1/2 tablet of Viagra is effective; denies side effects.  OSA:  Uses CPAP machine nightly. Doesn't like it, but is compliant--definitely feels better when using it.  DM: managed by Dr. Sharl Ma. He recalls that his last A1c was 6.7 approx 3 weeks ago, and yearly urine test was also done.  Immunization History  Administered Date(s) Administered  . Influenza Split 04/19/2011, 03/01/2012  . Pneumococcal Conjugate 06/07/2004  . Pneumococcal Polysaccharide 03/01/2012  . Td 06/07/2002  . Tdap 03/01/2012  . Zoster 04/18/2008   Last colonoscopy: 07/07/11, due again in 5 years  Last PSA: this week Dentist: once yearly  Ophtho: once yearly, in the fall Exercise: walks 3 miles, 6-7days/week  Past Medical History  Diagnosis Date  . Diabetes mellitus   . Hypertension   . Hyperlipidemia elevated chol/TG  . Sleep apnea on CPAP  . Allergy   . Erectile dysfunction   . Kidney stones 8/07 DrKimbrough  . History of shingles mid 90's  . 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 Colon polyp 07/07/11    adenomatous and hyperplastic    Past Surgical History  Procedure  Laterality Date  . Tonsillectomy and adenoidectomy  as a child  . Rotator cuff repair  right 07/2010    History   Social History  . Marital Status: Married    Spouse Name: N/A    Number of Children: 3  . Years of Education: N/A   Occupational History  . Not on file.   Social History Main Topics  . Smoking status: Former Smoker    Quit date: 10/01/1986  . Smokeless tobacco: Never Used  . Alcohol Use: Yes     Comment: daily, 2 drinks, 1 scotch and 1 glass wine  . Drug Use: No  . Sexually Active: Yes -- Male partner(s)   Other Topics Concern  . Not on file   Social History Narrative   Lives with wife, 1 dog.  2 children in [place], 1 in [place], 6 grandchildren.  Retired from Associate Professor    Family History  Problem Relation Age of Onset  . Diabetes Mother   . Hyperlipidemia Mother   . Hypertension Mother   . Alzheimer's disease Mother   . Diabetes Father   . Hypertension Father   . Hyperlipidemia Brother   . Hypertension Maternal Grandmother   . Stroke Maternal Grandmother   . Heart attack Maternal Grandfather   . Prostate cancer Paternal Grandfather   . Hypertension Brother   . Colon cancer Neg Hx   . Stomach cancer Neg Hx     Current outpatient prescriptions:amLODipine (NORVASC) 5 MG tablet, TAKE 1 TABLET BY MOUTH ONCE DAILY, Disp:  90 tablet, Rfl: 3;  aspirin 81 MG tablet, Take 81 mg by mouth daily.  , Disp: , Rfl: ;  CRESTOR 10 MG tablet, take 1 tablet by mouth once daily, Disp: 90 each, Rfl: 1;  fenofibrate (TRICOR) 145 MG tablet, take 1 tablet by mouth once daily, Disp: 90 tablet, Rfl: 3 insulin aspart (NOVOLOG) 100 UNIT/ML injection, Inject 16 Units into the skin 2 (two) times daily.  , Disp: , Rfl: ;  insulin NPH (HUMULIN N,NOVOLIN N) 100 UNIT/ML injection, Inject 32 Units into the skin 2 (two) times daily. , Disp: , Rfl: ;  losartan-hydrochlorothiazide (HYZAAR) 100-12.5 MG per tablet, take 1 tablet by mouth daily, Disp: 90 tablet, Rfl: 1 metFORMIN (GLUCOPHAGE) 1000 MG  tablet, Take 1,000 mg by mouth 2 (two) times daily with a meal.  , Disp: , Rfl: ;  VIAGRA 100 MG tablet, TAKE 1/2 TO 1 TABLET BY MOUTH AS NEEDED FOR ERECTILE DYSFUNCTION, Disp: 30 each, Rfl: 6  No Known Allergies  ROS: The patient denies anorexia, fever, weight changes, headaches, vision loss, decreased hearing, ear pain, hoarseness, chest pain, palpitations, dizziness, syncope, dyspnea on exertion, cough, swelling, nausea, vomiting, diarrhea, constipation, abdominal pain, melena, hematochezia, indigestion/heartburn, hematuria, incontinence, nocturia, weakened urine stream, dysuria, genital lesions, joint pains, numbness, tingling, weakness, tremor, suspicious skin lesions, depression, anxiety, abnormal bleeding/bruising, or enlarged lymph nodes. +erectile dysfunction  PHYSICAL EXAM: BP 138/80  Pulse 68  Ht 5' 8.5" (1.74 m)  Wt 204 lb (92.534 kg)  BMI 30.56 kg/m2 150/70 on repeat by MD   General Appearance:  Alert, cooperative, no distress, appears stated age   Head:  Normocephalic, without obvious abnormality, atraumatic   Eyes:  PERRL, conjunctiva/corneas clear, EOM's intact, fundi  benign   Ears:  Normal TM's and external ear canals   Nose:  Nares normal, mucosa normal, no drainage or sinus tenderness   Throat:  Lips, mucosa, and tongue normal; teeth and gums normal   Neck:  Supple, no lymphadenopathy; thyroid: no enlargement/tenderness/nodules; no carotid  bruit or JVD   Back:  Spine nontender, no curvature, ROM normal, no CVA tenderness   Lungs:  Clear to auscultation bilaterally without wheezes, rales or ronchi; respirations unlabored   Chest Wall:  No tenderness or deformity   Heart:  Regular rate and rhythm, S1 and S2 normal, no murmur, rub  or gallop   Breast Exam:  No chest wall tenderness, masses or gynecomastia   Abdomen:  Soft, non-tender, nondistended, normoactive bowel sounds,  no masses, no hepatosplenomegaly   Genitalia:  Normal male external genitalia without lesions.  Testicles without masses. No inguinal hernias.   Rectal:  Normal sphincter tone, no masses or tenderness; guaiac negative stool. Small noninflamed hemorrhoidal tag. Prostate smooth, no nodules, mild-mod enlarged.   Extremities:  No clubbing, cyanosis or edema   Pulses:  2+ and symmetric all extremities   Skin:  Skin color, texture, turgor normal, no rashes  Lymph nodes:  Cervical, supraclavicular, and axillary nodes normal   Neurologic:  CNII-XII intact, normal strength, sensation and gait; reflexes 2+ and symmetric throughout           Psych: Normal mood, affect, hygiene and grooming.   Lab Results  Component Value Date   PSA 0.80 10/31/2012   PSA 0.68 10/13/2011   Lab Results  Component Value Date   CHOL 131 10/31/2012   HDL 35* 10/31/2012   LDLCALC 70 10/31/2012   TRIG 131 10/31/2012   CHOLHDL 3.7 10/31/2012     Chemistry  Component Value Date/Time   NA 136 10/31/2012 0842   K 4.1 10/31/2012 0842   CL 103 10/31/2012 0842   CO2 25 10/31/2012 0842   BUN 18 10/31/2012 0842   CREATININE 0.92 10/31/2012 0842   CREATININE 1.17 07/07/2010 1130      Component Value Date/Time   CALCIUM 9.3 10/31/2012 0842   ALKPHOS 30* 10/31/2012 0842   AST 15 10/31/2012 0842   ALT 19 10/31/2012 0842   BILITOT 0.8 10/31/2012 0842     Glucose 186  Lab Results  Component Value Date   TSH 1.184 10/31/2012   Lab Results  Component Value Date   WBC 3.5* 10/31/2012   HGB 12.9* 10/31/2012   HCT 37.1* 10/31/2012   MCV 93.0 10/31/2012   PLT 224 10/31/2012   ASSESSMENT/PLAN:  Essential hypertension, benign - well controlled - Plan: amLODipine (NORVASC) 5 MG tablet  Erectile dysfunction  OSA on CPAP  Type II or unspecified type diabetes mellitus without mention of complication, not stated as uncontrolled - per Dr. Sharl Ma, controlled  Mixed hyperlipidemia - Plan: fenofibrate (TRICOR) 145 MG tablet  Encounter for Medicare annual wellness exam  AWV--see copy of sheet given to pt with preventative  recommendations, all UTD. MOST form filled out and discussed.  HTN--well controlled per home numbers.  Continue to monitor.   Weight loss encouraged--discussed healthy diet, cutting back on portions, calories from beverages.  All lab results reviewed.  Borderline low Hg noted.   Recommended at least 30 minutes of aerobic activity at least 5 days/week; proper sunscreen use reviewed; healthy diet and alcohol recommendations (less than or equal to 2 drinks/day) reviewed; regular seatbelt use; changing batteries in smoke detectors. Self-testicular exams. Immunization recommendations discussed, UTD. Colonoscopy recommendations reviewed--due again 2018

## 2012-11-08 DIAGNOSIS — L219 Seborrheic dermatitis, unspecified: Secondary | ICD-10-CM | POA: Diagnosis not present

## 2012-11-08 DIAGNOSIS — D485 Neoplasm of uncertain behavior of skin: Secondary | ICD-10-CM | POA: Diagnosis not present

## 2012-11-08 DIAGNOSIS — L570 Actinic keratosis: Secondary | ICD-10-CM | POA: Diagnosis not present

## 2012-11-24 ENCOUNTER — Other Ambulatory Visit: Payer: Self-pay | Admitting: Family Medicine

## 2012-12-22 ENCOUNTER — Other Ambulatory Visit: Payer: Self-pay | Admitting: Family Medicine

## 2013-03-05 DIAGNOSIS — H902 Conductive hearing loss, unspecified: Secondary | ICD-10-CM | POA: Diagnosis not present

## 2013-03-05 DIAGNOSIS — H6120 Impacted cerumen, unspecified ear: Secondary | ICD-10-CM | POA: Diagnosis not present

## 2013-03-07 DIAGNOSIS — E119 Type 2 diabetes mellitus without complications: Secondary | ICD-10-CM | POA: Diagnosis not present

## 2013-03-07 DIAGNOSIS — H2510 Age-related nuclear cataract, unspecified eye: Secondary | ICD-10-CM | POA: Diagnosis not present

## 2013-03-07 DIAGNOSIS — D3130 Benign neoplasm of unspecified choroid: Secondary | ICD-10-CM | POA: Diagnosis not present

## 2013-03-13 DIAGNOSIS — Z23 Encounter for immunization: Secondary | ICD-10-CM | POA: Diagnosis not present

## 2013-03-26 ENCOUNTER — Telehealth: Payer: Self-pay | Admitting: Family Medicine

## 2013-03-26 NOTE — Telephone Encounter (Signed)
Okay for rx for new CPAP machine, but they will need to see a sleep study report.  Look through chart to see if there is one in old records.  If insurance is paying for it, they might require he have another sleep study

## 2013-03-26 NOTE — Telephone Encounter (Signed)
i have faxed over a new rx for a new cpap machine. i could not find the sleep study and pt states it was over 20 years ago so i told him that it maybe hurt to get a copy of that and if insurance is paying that they may just require a new sleep study since its been that long.

## 2013-03-26 NOTE — Telephone Encounter (Signed)
Pt called and stated that he is having issues with his cpap machine. He states that you wrote him a rx for a new one about 2 years ago and he never filled it because his ins would not pay. He states that he called [facility] to get a knew on and they stated it wiould need to come from you. Pt states that [facility] infomred him that they would need a copy of his sleep study. Pt states that he had this done over 20 years ago and it might be an issue finding it because the doctor that ordered it is no longer practicing. Pt is requesting a new order for a new cpap machine. Pt uses [facility].

## 2013-04-02 ENCOUNTER — Telehealth: Payer: Self-pay | Admitting: *Deleted

## 2013-04-02 NOTE — Telephone Encounter (Signed)
That's fine

## 2013-04-02 NOTE — Telephone Encounter (Signed)
Patient called and spoke with Sabrina on 03/26/13 when I was out re:rx for new CPAP machine. It is required that he have a new sleep study done. Patient uses [facility] and would like to have a new sleep study ordered, just wanted to make sure you were okay with this- using [facility] for home sleep study. Pleae advise.

## 2013-04-12 ENCOUNTER — Other Ambulatory Visit: Payer: Self-pay

## 2013-04-20 DIAGNOSIS — G4733 Obstructive sleep apnea (adult) (pediatric): Secondary | ICD-10-CM | POA: Diagnosis not present

## 2013-05-01 DIAGNOSIS — E119 Type 2 diabetes mellitus without complications: Secondary | ICD-10-CM | POA: Diagnosis not present

## 2013-05-01 DIAGNOSIS — E669 Obesity, unspecified: Secondary | ICD-10-CM | POA: Diagnosis not present

## 2013-06-05 DIAGNOSIS — M545 Low back pain, unspecified: Secondary | ICD-10-CM | POA: Diagnosis not present

## 2013-06-06 ENCOUNTER — Telehealth: Payer: Self-pay | Admitting: Family Medicine

## 2013-06-06 NOTE — Telephone Encounter (Signed)
Pt left message on my voice mail that we haven't gotten the info to [facility] for his desperately needed CPAP.  Please call pt with status [phone]

## 2013-06-06 NOTE — Telephone Encounter (Signed)
Called [facility] and they told me that they canceled the order back on 05/22/13 cause the order was incomplete and they they notified us and the patient and I told [facility] that we did not receive anything and the lady at [facility] spoke with her manager and is processing the referral back through that it will take 2-4 weeks for it to be approved due to him having medicare. But he should be receiving a curiously call and updates.   Pt was notified about this and he said he would probably call up there to get a status check too.

## 2013-06-23 ENCOUNTER — Other Ambulatory Visit: Payer: Self-pay | Admitting: Family Medicine

## 2013-07-16 ENCOUNTER — Ambulatory Visit (INDEPENDENT_AMBULATORY_CARE_PROVIDER_SITE_OTHER): Payer: Medicare Other | Admitting: Family Medicine

## 2013-07-16 ENCOUNTER — Encounter: Payer: Self-pay | Admitting: Family Medicine

## 2013-07-16 VITALS — BP 132/80 | HR 72 | Temp 98.3°F | Ht 68.5 in | Wt 202.0 lb

## 2013-07-16 DIAGNOSIS — E119 Type 2 diabetes mellitus without complications: Secondary | ICD-10-CM | POA: Diagnosis not present

## 2013-07-16 DIAGNOSIS — R197 Diarrhea, unspecified: Secondary | ICD-10-CM | POA: Diagnosis not present

## 2013-07-16 DIAGNOSIS — R109 Unspecified abdominal pain: Secondary | ICD-10-CM

## 2013-07-16 LAB — POCT URINALYSIS DIPSTICK
Bilirubin, UA: NEGATIVE
GLUCOSE UA: 250
Ketones, UA: NEGATIVE
LEUKOCYTES UA: NEGATIVE
Nitrite, UA: NEGATIVE
Protein, UA: NEGATIVE
Spec Grav, UA: 1.02
UROBILINOGEN UA: NEGATIVE
pH, UA: 5

## 2013-07-16 NOTE — Patient Instructions (Addendum)
  Talk to your pharmacist about whether or not they changed the supplier of your generic metformin.  If they did, that might be the cause of your symptoms, not tolerating this new metformin as well as the prior one.  If that is the case, you can ask them to order from the former manufacturer.   If NO change was made to your generic metformin, then we can consider trying changing to the XR version, and see if your stomach tolerates it any better.  That should come through Dr. Cindra Eves office, since he manages your diabetes.  There may also be a component of lactose intolerance, related to some of the loose stools.  Try and follow a lactose-free diet for a week, and if symptoms improve, you can re-try adding back the lactose and see how you feel.  It is also possible/likely that the metamucil might be contributing to the bloating/gassiness.  Try to eat a high fiber diet, rather than taking a fiber supplement, or trying other forms of fiber supplements.  If you develop blood or mucus in your stools, weight loss, decreased appetite, fever, nausea or vomiting, or any other new symptoms, please return for re-evaluation.  You can try simethicone (ie Gas-X) as needed for abdominal pain/bloating

## 2013-07-16 NOTE — Progress Notes (Signed)
Chief Complaint  Patient presents with  . Diarrhea    for the last 2-3 months.  Over the last week has been having some abdominal pain and bloating.    Having loose stools in the mornings for the last 2-3 months.  Sometimes he wakes up with a sharp pain, has a loose stool, and sometimes after eating breakfast, he will go again, sometimes 2-3 times in the morning, within an hour of waking up.  He seems to be fine the rest of the day.  In the last 3-4 days, he has felt more bloated and gassy, which is new.  Appetite is fine.  Denies early satiety.  Denies heartburn or belching, no dysphagia.  Denies any change in diet recently, although he started taking metamucil 4 days ago.  He states he only took it for 2 days, not over the weekend, and felt bad over the weekend also (when he didn't take the metamucil).  Denies blood or mucus in stool.  Denies early satiety, weight loss, fevers, chills, nausea or vomiting.  He got a new CPAP machine last week, and the air pressure is higher than previous setting.  He has been on Metformin 1000 mg BID, no recent changes in dose.  Something was changed by the pharmacy (one of his generics), but he doesn't recall which one. He gave up using artifical sweetener in his coffee about 2 months ago.  Doesn't chew gum (sugar-free or otherwise), and denies any other changes in his diet. Denies h/o gall bladder problems, denies RUQ pain.  He sees Dr. Buddy Duty for his diabetes.  Last A1c was 7.2.  Insulin dose was increased, but metformin remained the same.  Past Medical History  Diagnosis Date  . Diabetes mellitus   . Hypertension   . Hyperlipidemia elevated chol/TG  . Sleep apnea on CPAP  . Allergy   . Erectile dysfunction   . Kidney stones 8/07 DrKimbrough  . History of shingles mid 90's  . 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 Colon polyp 07/07/11    adenomatous and hyperplastic   Past Surgical History  Procedure Laterality Date  . Tonsillectomy  and adenoidectomy  as a child  . Rotator cuff repair  right 07/2010   History   Social History  . Marital Status: Married    Spouse Name: N/A    Number of Children: 3  . Years of Education: N/A   Occupational History  . Not on file.   Social History Main Topics  . Smoking status: Former Smoker    Quit date: 10/01/1986  . Smokeless tobacco: Never Used  . Alcohol Use: Yes     Comment: daily, 2 drinks, 1 scotch and 1 glass wine  . Drug Use: No  . Sexual Activity: Yes    Partners: Female   Other Topics Concern  . Not on file   Social History Narrative   Lives with wife, 1 dog.  2 children in [place], 1 in [place], 6 grandchildren.  Retired from [employer] Prescriptions as of 07/16/2013  Medication Sig  . amLODipine (NORVASC) 5 MG tablet TAKE 1 TABLET BY MOUTH ONCE DAILY  . aspirin 81 MG tablet Take 81 mg by mouth daily.    . CRESTOR 10 MG tablet take 1 tablet by mouth once daily  . fenofibrate (TRICOR) 145 MG tablet take 1 tablet by mouth once daily  . insulin aspart (NOVOLOG) 100 UNIT/ML injection Inject 16 Units  into the skin 2 (two) times daily.    . insulin NPH (HUMULIN N,NOVOLIN N) 100 UNIT/ML injection Inject 32 Units into the skin 2 (two) times daily.   Marland Kitchen losartan-hydrochlorothiazide (HYZAAR) 100-12.5 MG per tablet take 1 tablet by mouth once daily  . metFORMIN (GLUCOPHAGE) 1000 MG tablet Take 1,000 mg by mouth 2 (two) times daily with a meal.    . VIAGRA 100 MG tablet TAKE 1/2 TO 1 TABLET BY MOUTH AS NEEDED FOR ERECTILE DYSFUNCTION  . [DISCONTINUED] fenofibrate (TRICOR) 145 MG tablet take 1 tablet by mouth once daily   No Known Allergies  ROS:  Denies weight changes, fevers, headaches, dizziness, chest pain, palpitations, URI symptoms, cough.  Denies bleeding, bruising, nausea, vomiting.  +diarrhea per HPI. Denies abdominal pain, edema, shortness of breath or other complaints.   PHYSICAL EXAM: BP 132/80  Pulse 72  Temp(Src) 98.3 F (36.8 C) (Oral)   Ht 5' 8.5" (1.74 m)  Wt 202 lb (91.627 kg)  BMI 30.26 kg/m2 Well developed, pleasant overweight male in no distress HEENT:  PERRL, EOMI, conjunctiva clear, mucus membranes moist Heart: regular rate and rhythm Lungs: clear bilaterally Abdomen: nontender, normal bowel sounds. No organomegaly or mass Extremities: no edema Back: no spine or CVA tenderness  Urine dip: 250 glucose, trace blood  ASSESSMENT/PLAN:  Diarrhea  Abdominal pain, unspecified site - Plan: POCT Urinalysis Dipstick  Type II or unspecified type diabetes mellitus without mention of complication, not stated as uncontrolled   Talk to your pharmacist about whether or not they changed the supplier of your generic metformin.  If they did, that might be the cause of your symptoms, not tolerating this new metformin as well as the prior one.  If that is the case, you can ask them to order from the former manufacturer.   If NO change was made to your generic metformin, then we can consider trying changing to the XR version, and see if your stomach tolerates it any better.  You should discuss this with Dr. Buddy Duty, who manages your diabetes.  There may also be a component of lactose intolerance, related to some of the loose stools.  Try and follow a lactose-free diet for a week, and if symptoms improve, you can re-try adding back the lactose and see how you feel.  It is also possible/likely that the metamucil might be contributing to the bloating/gassiness.  Try to eat a high fiber diet, rather than taking a fiber supplement, or trying other forms of fiber supplements.  If you develop blood or mucus in your stools, weight loss, decreased appetite, fever, nausea or vomiting, or any other new symptoms, please return for re-evaluation.  DDX was reviewed in detail, including s/sx of gall bladder disease.  No etiology found today, exam normal today  25 min visit, more than 1/2 spent counseling.

## 2013-08-03 ENCOUNTER — Telehealth: Payer: Self-pay | Admitting: Internal Medicine

## 2013-08-03 DIAGNOSIS — I10 Essential (primary) hypertension: Secondary | ICD-10-CM

## 2013-08-03 MED ORDER — AMLODIPINE BESYLATE 5 MG PO TABS: ORAL_TABLET | ORAL | Status: DC

## 2013-08-03 NOTE — Telephone Encounter (Signed)
Refill request for amlodipine 5mg   [facility]. Pt states that there is no more refills on his current rx of it

## 2013-09-19 ENCOUNTER — Ambulatory Visit (INDEPENDENT_AMBULATORY_CARE_PROVIDER_SITE_OTHER): Payer: Medicare Other | Admitting: Family Medicine

## 2013-09-19 ENCOUNTER — Encounter: Payer: Self-pay | Admitting: Family Medicine

## 2013-09-19 VITALS — BP 140/60 | HR 80 | Ht 68.5 in | Wt 204.0 lb

## 2013-09-19 DIAGNOSIS — Z9989 Dependence on other enabling machines and devices: Principal | ICD-10-CM

## 2013-09-19 DIAGNOSIS — G4733 Obstructive sleep apnea (adult) (pediatric): Secondary | ICD-10-CM

## 2013-09-19 NOTE — Progress Notes (Signed)
Chief Complaint  Patient presents with  . Advice Only    face to face OV for CPAP(CPAP re-evaluation for [employer]).    He has had his CPAP machine for 3-4 months, and requires in-person meeting in order to get his CPAP machine and supplies covered by his insurance. He brings in a letter from [employer] requesting documentation for Medicare to pay for this  Review of patient's chart reveals that he has longstanding h/o of OSA and has been on CPAP.  He needed to get a new machine, and due to insurance requirements, needed to have a new sleep study.  This was done 04/14/13, and as expected, revealed severe OSA.  We then ordered autotitration study, and I never got the detailed report.  However, the patient has been set up with a new CPAP machine. He is using it nightly without any side effects or problems.   He used the old machine up until he got the new one, so has been using CPAP compliantly for some time.  He hasn't had any symptoms of sleep apnea, due to his compliance with use of CPAP.  He feels refreshed upon awakening in the morning, and no daytime somnolence.  He only recalls missing using it once a few years ago when out of town, and he felt terrible without it.   PHYSICAL EXAM: BP 140/60  Pulse 80  Ht 5' 8.5" (1.74 m)  Wt 204 lb (92.534 kg)  BMI 30.56 kg/m2   Well developed, pleasant male in no distress Heart: regular rate and rhythm Lungs: clear bilaterally Neck: no lymphadenopathy    ASSESSMENT/PLAN:  OSA on CPAP - doing very well on CPAP.  continue longterm use.  Copy of this progress note will be forwarded to [place] for documentation of in-person meeting today.

## 2013-10-01 ENCOUNTER — Other Ambulatory Visit: Payer: Self-pay | Admitting: Family Medicine

## 2013-10-01 DIAGNOSIS — N529 Male erectile dysfunction, unspecified: Secondary | ICD-10-CM

## 2013-10-01 NOTE — Telephone Encounter (Signed)
done

## 2013-10-01 NOTE — Telephone Encounter (Signed)
Is this okay to refill? 

## 2013-10-17 ENCOUNTER — Ambulatory Visit (INDEPENDENT_AMBULATORY_CARE_PROVIDER_SITE_OTHER): Payer: Medicare Other | Admitting: Family Medicine

## 2013-10-17 ENCOUNTER — Encounter: Payer: Self-pay | Admitting: Family Medicine

## 2013-10-17 VITALS — BP 130/80 | HR 64 | Temp 98.1°F | Ht 68.5 in | Wt 201.0 lb

## 2013-10-17 DIAGNOSIS — E119 Type 2 diabetes mellitus without complications: Secondary | ICD-10-CM

## 2013-10-17 DIAGNOSIS — I10 Essential (primary) hypertension: Secondary | ICD-10-CM | POA: Diagnosis not present

## 2013-10-17 DIAGNOSIS — Z79899 Other long term (current) drug therapy: Secondary | ICD-10-CM

## 2013-10-17 DIAGNOSIS — D126 Benign neoplasm of colon, unspecified: Secondary | ICD-10-CM

## 2013-10-17 DIAGNOSIS — E7800 Pure hypercholesterolemia, unspecified: Secondary | ICD-10-CM

## 2013-10-17 DIAGNOSIS — R197 Diarrhea, unspecified: Secondary | ICD-10-CM | POA: Diagnosis not present

## 2013-10-17 DIAGNOSIS — Z125 Encounter for screening for malignant neoplasm of prostate: Secondary | ICD-10-CM

## 2013-10-17 NOTE — Patient Instructions (Signed)
Trial of Probiotics (ie Align) Consider discussing metformin dosing with Dr. Buddy Duty (?trial of lower dose, with titrating the insulin, just to see if metformin contributes) Stool studies. Use imodium as needed
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
## 2013-10-18 ENCOUNTER — Telehealth: Payer: Self-pay | Admitting: Family Medicine

## 2013-10-18 NOTE — Telephone Encounter (Signed)
[facility] called for ov for cpap faxed to [phone]

## 2013-10-19 DIAGNOSIS — R197 Diarrhea, unspecified: Secondary | ICD-10-CM | POA: Diagnosis not present

## 2013-10-25 ENCOUNTER — Telehealth: Payer: Self-pay | Admitting: *Deleted

## 2013-10-25 NOTE — Telephone Encounter (Signed)
Called patient to let him know stool studies were all negative.

## 2013-11-01 ENCOUNTER — Other Ambulatory Visit: Payer: Medicare Other

## 2013-11-01 DIAGNOSIS — E7800 Pure hypercholesterolemia, unspecified: Secondary | ICD-10-CM

## 2013-11-01 DIAGNOSIS — E119 Type 2 diabetes mellitus without complications: Secondary | ICD-10-CM | POA: Diagnosis not present

## 2013-11-01 DIAGNOSIS — Z125 Encounter for screening for malignant neoplasm of prostate: Secondary | ICD-10-CM

## 2013-11-01 DIAGNOSIS — R197 Diarrhea, unspecified: Secondary | ICD-10-CM | POA: Diagnosis not present

## 2013-11-01 DIAGNOSIS — Z79899 Other long term (current) drug therapy: Secondary | ICD-10-CM | POA: Diagnosis not present

## 2013-11-01 DIAGNOSIS — I10 Essential (primary) hypertension: Secondary | ICD-10-CM | POA: Diagnosis not present

## 2013-11-01 LAB — COMPREHENSIVE METABOLIC PANEL
ALT: 19 U/L (ref 0–53)
AST: 17 U/L (ref 0–37)
Albumin: 4.7 g/dL (ref 3.5–5.2)
Alkaline Phosphatase: 32 U/L — ABNORMAL LOW (ref 39–117)
BUN: 22 mg/dL (ref 6–23)
CALCIUM: 9.7 mg/dL (ref 8.4–10.5)
CHLORIDE: 100 meq/L (ref 96–112)
CO2: 25 meq/L (ref 19–32)
Creat: 1.1 mg/dL (ref 0.50–1.35)
Glucose, Bld: 195 mg/dL — ABNORMAL HIGH (ref 70–99)
Potassium: 4.3 mEq/L (ref 3.5–5.3)
Sodium: 134 mEq/L — ABNORMAL LOW (ref 135–145)
Total Bilirubin: 0.8 mg/dL (ref 0.2–1.2)
Total Protein: 6.4 g/dL (ref 6.0–8.3)

## 2013-11-01 LAB — CBC WITH DIFFERENTIAL/PLATELET
BASOS ABS: 0 10*3/uL (ref 0.0–0.1)
Basophils Relative: 1 % (ref 0–1)
EOS PCT: 2 % (ref 0–5)
Eosinophils Absolute: 0.1 10*3/uL (ref 0.0–0.7)
HCT: 39.2 % (ref 39.0–52.0)
Hemoglobin: 13.5 g/dL (ref 13.0–17.0)
LYMPHS PCT: 30 % (ref 12–46)
Lymphs Abs: 1.3 10*3/uL (ref 0.7–4.0)
MCH: 32.8 pg (ref 26.0–34.0)
MCHC: 34.4 g/dL (ref 30.0–36.0)
MCV: 95.1 fL (ref 78.0–100.0)
Monocytes Absolute: 0.3 10*3/uL (ref 0.1–1.0)
Monocytes Relative: 7 % (ref 3–12)
NEUTROS ABS: 2.6 10*3/uL (ref 1.7–7.7)
NEUTROS PCT: 60 % (ref 43–77)
PLATELETS: 226 10*3/uL (ref 150–400)
RBC: 4.12 MIL/uL — AB (ref 4.22–5.81)
RDW: 14.3 % (ref 11.5–15.5)
WBC: 4.4 10*3/uL (ref 4.0–10.5)

## 2013-11-01 LAB — LIPID PANEL
CHOLESTEROL: 110 mg/dL (ref 0–200)
HDL: 30 mg/dL — AB (ref >39)
LDL Cholesterol: 46 mg/dL (ref 0–99)
Total CHOL/HDL Ratio: 3.7 Ratio
Triglycerides: 169 mg/dL — ABNORMAL HIGH (ref <150)
VLDL: 34 mg/dL (ref 0–40)

## 2013-11-02 LAB — PSA, MEDICARE: PSA: 1.06 ng/mL (ref <=4.00)

## 2013-11-02 LAB — TSH: TSH: 1.046 u[IU]/mL (ref 0.350–4.500)

## 2013-11-05 ENCOUNTER — Encounter: Payer: Self-pay | Admitting: Family Medicine

## 2013-11-05 ENCOUNTER — Ambulatory Visit (INDEPENDENT_AMBULATORY_CARE_PROVIDER_SITE_OTHER): Payer: Medicare Other | Admitting: Family Medicine

## 2013-11-05 VITALS — BP 138/80 | HR 76 | Ht 68.5 in | Wt 205.0 lb

## 2013-11-05 DIAGNOSIS — Z125 Encounter for screening for malignant neoplasm of prostate: Secondary | ICD-10-CM

## 2013-11-05 DIAGNOSIS — G4733 Obstructive sleep apnea (adult) (pediatric): Secondary | ICD-10-CM

## 2013-11-05 DIAGNOSIS — E7800 Pure hypercholesterolemia, unspecified: Secondary | ICD-10-CM | POA: Diagnosis not present

## 2013-11-05 DIAGNOSIS — I10 Essential (primary) hypertension: Secondary | ICD-10-CM | POA: Diagnosis not present

## 2013-11-05 DIAGNOSIS — E119 Type 2 diabetes mellitus without complications: Secondary | ICD-10-CM | POA: Diagnosis not present

## 2013-11-05 DIAGNOSIS — R197 Diarrhea, unspecified: Secondary | ICD-10-CM

## 2013-11-05 DIAGNOSIS — N529 Male erectile dysfunction, unspecified: Secondary | ICD-10-CM

## 2013-11-05 DIAGNOSIS — Z9989 Dependence on other enabling machines and devices: Secondary | ICD-10-CM

## 2013-11-05 DIAGNOSIS — Z0000 Encounter for general adult medical examination without abnormal findings: Secondary | ICD-10-CM

## 2013-11-05 MED ORDER — AMLODIPINE BESYLATE 5 MG PO TABS: ORAL_TABLET | ORAL | Status: DC

## 2013-11-05 NOTE — Progress Notes (Signed)
Chief Complaint  Patient presents with  . Med check plus    nonfasting med check plus/ AWV-had labs done already. Only concern is that he is still having his GI issues (diarrhea) since last visit.    [name] is a 68 y.o. male who presents for routine med check, along with Annual Wellness Visit. He had labs prior to his visit.  AWV:  Other doctors he sees:  Dr. Buddy Duty for diabetes.  Dr. [place] for ophtho  Dr. Miguel Dibble  Dr. Gayla Doss  Dr. Lupton--dermatologist  Depression and ADL screen--see questionnaires filled out by pt. No concerns.   End of Life issues: He has a living will.   Diarrhea follow-up:  Stool studies were negative.  Stools had improved on the regimen of fiber supplement and daily probiotic, until this weekend.  For the last 3 days he had somewhat of an upset stomach--some discomfort/gas/bloating and loose/watery stools, occuring just in the mornings (had 3 episodes this morning), usually before meds, when he wakes up, usually after coffee (sometimes starts before).  Hypertension follow-up: Blood pressures elsewhere are 130/70-80. Denies dizziness, headaches, chest pain. Denies side effects of medications.   Hyperlipidemia follow-up: Patient is reportedly following a low-fat, low cholesterol diet. Compliant with medications and denies medication side effects.  ED: 1/2 tablet of Viagra is not as effective as in the past, now needing 3/4-1 tablet; denies side effects.   OSA: Uses CPAP machine nightly. He feels better when using it.   DM: managed by Dr. Buddy Duty. His last A1c was 7.4 04/2013.  He has an appointment later this week with Dr. Buddy Duty. Morning sugars are usually 120-150; sometimes checks in the evenings, and usually is lower (around 100).  Denies hypoglycemia (very rare, once in the last year).  Denies polydipsia, polyuria.  Last eye exam was 02/2013, goes yearly.    Immunization History  Administered Date(s) Administered  . Influenza  Split 04/19/2011, 03/01/2012  . Pneumococcal Conjugate-13 06/07/2004  . Pneumococcal Polysaccharide-23 03/01/2012  . Td 06/07/2002  . Tdap 03/01/2012  . Zoster 04/18/2008  got flu shot elsewhere, gets yearly Last colonoscopy: 07/07/11, due again in 5 years  Last PSA: with recent bloodwork Dentist: once yearly  Ophtho: once yearly, in the fall  Exercise: walks 3 miles, 6-7days/week, and golfing (cart).  Does some weights at home.  Past Medical History  Diagnosis Date  . Diabetes mellitus   . Hypertension   . Hyperlipidemia elevated chol/TG  . Sleep apnea on CPAP  . Allergy   . Erectile dysfunction   . Kidney stones 8/07 DrKimbrough  . History of shingles mid 90's  . 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 Colon polyp 07/07/11    adenomatous and hyperplastic    Past Surgical History  Procedure Laterality Date  . Tonsillectomy and adenoidectomy  as a child  . Rotator cuff repair  right 07/2010    History   Social History  . Marital Status: Married    Spouse Name: N/A    Number of Children: 3  . Years of Education: N/A   Occupational History  . Not on file.   Social History Main Topics  . Smoking status: Former Smoker    Quit date: 10/01/1986  . Smokeless tobacco: Never Used  . Alcohol Use: Yes     Comment: daily, 2 drinks, 1 scotch and 1 glass wine  . Drug Use: No  . Sexual Activity: Yes    Partners: Female  Other Topics Concern  . Not on file   Social History Narrative   Lives with wife, 1 dog.  2 children in [place], 1 in [place], 6 grandchildren.  Retired from Radiation protection practitioner.  Sees grandkids daily    Family History  Problem Relation Age of Onset  . Diabetes Mother   . Hyperlipidemia Mother   . Hypertension Mother   . Alzheimer's disease Mother   . Diabetes Father   . Hypertension Father   . Hyperlipidemia Brother   . Hypertension Maternal Grandmother   . Stroke Maternal Grandmother   . Heart attack Maternal Grandfather   . Prostate cancer  Paternal Grandfather   . Hypertension Brother   . Colon cancer Neg Hx   . Stomach cancer Neg Hx    Outpatient Encounter Prescriptions as of 11/05/2013  Medication Sig Note  . amLODipine (NORVASC) 5 MG tablet TAKE 1 TABLET BY MOUTH ONCE DAILY   . aspirin 81 MG tablet Take 81 mg by mouth daily.     . CRESTOR 10 MG tablet take 1 tablet by mouth once daily   . fenofibrate (TRICOR) 145 MG tablet take 1 tablet by mouth once daily   . FIBER PO Take 2 each by mouth daily.   . insulin aspart (NOVOLOG) 100 UNIT/ML injection Inject 16 Units into the skin 2 (two) times daily.     . insulin NPH (HUMULIN N,NOVOLIN N) 100 UNIT/ML injection Inject 32 Units into the skin 2 (two) times daily. 32 U in morning, 40 U at night   . losartan-hydrochlorothiazide (HYZAAR) 100-12.5 MG per tablet take 1 tablet by mouth once daily   . metFORMIN (GLUCOPHAGE) 1000 MG tablet Take 1,000 mg by mouth 2 (two) times daily with a meal.     . Probiotic Product (ALIGN PO) Take 1 tablet by mouth daily.   Marland Kitchen VIAGRA 100 MG tablet TAKE 1/2 TO 1 TABLET BY MOUTH AS NEEDED FOR ERECTILE DYSFUNCTION   . Loperamide HCl (IMODIUM A-D PO) Take 1 tablet by mouth as needed. 11/05/2013: Uses prn, hasn't used in a week   No Known Allergies  ROS: The patient denies anorexia, fever, weight changes, headaches, vision loss, decreased hearing, ear pain, hoarseness, chest pain, palpitations, dizziness, syncope, dyspnea on exertion, cough, swelling, nausea, vomiting, constipation, melena, hematochezia, indigestion/heartburn, hematuria, incontinence, nocturia, weakened urine stream, dysuria, genital lesions, joint pains, numbness, tingling, weakness, tremor, suspicious skin lesions, depression, anxiety, abnormal bleeding/bruising, or enlarged lymph nodes. +erectile dysfunction  +bloating and diarrhea as per HPI   PHYSICAL EXAM:  BP 138/80  Pulse 76  Ht 5' 8.5" (1.74 m)  Wt 205 lb (92.987 kg)  BMI 30.71 kg/m2  General Appearance:  Alert, cooperative,  no distress, appears stated age   Head:  Normocephalic, without obvious abnormality, atraumatic   Eyes:  PERRL, conjunctiva/corneas clear, EOM's intact, fundi  benign   Ears:  Normal TM's and external ear canals   Nose:  Nares normal, mucosa normal, no drainage or sinus tenderness   Throat:  Lips, mucosa, and tongue normal; teeth and gums normal   Neck:  Supple, no lymphadenopathy; thyroid: no enlargement/tenderness/nodules; no carotid  bruit or JVD   Back:  Spine nontender, no curvature, ROM normal, no CVA tenderness   Lungs:  Clear to auscultation bilaterally without wheezes, rales or ronchi; respirations unlabored   Chest Wall:  No tenderness or deformity   Heart:  Regular rate and rhythm, S1 and S2 normal, no murmur, rub  or gallop   Breast Exam:  No  chest wall tenderness, masses or gynecomastia   Abdomen:  Soft, non-tender, nondistended, normoactive bowel sounds,  no masses, no hepatosplenomegaly   Genitalia:  Normal male external genitalia without lesions. Testicles without masses. No inguinal hernias.   Rectal:  Normal sphincter tone, no masses or tenderness; guaiac negative stool. Small noninflamed hemorrhoidal tag. Prostate smooth, no nodules, mild-mod enlarged.   Extremities:  No clubbing, cyanosis or edema   Pulses:  2+ and symmetric all extremities   Skin:  Skin color, texture, turgor normal, no rashes.  AK on right ear, and on LE's. Rash mid chest--pt states present x years, somewhat follicular, and also AK/SK's. There is 1-62m flesh colored nodule on right feet, medial heel area  Lymph nodes:  Cervical, supraclavicular, and axillary nodes normal   Neurologic:  CNII-XII intact, normal strength, sensation and gait; reflexes 2+ and symmetric throughout          Psych: Normal mood, affect, hygiene and grooming.  Diabetic foot exam was performed: Thickened/discolored toenails (1st and fifth on the left, 1, 2, 3, 5 on the right) Decreased sensation over heels, somewhat calloused.   Remainder of foot exam is normal.  There is 1-220mflesh colored nodule on right feet, medial heel area  Lab Results  Component Value Date   WBC 4.4 11/01/2013   HGB 13.5 11/01/2013   HCT 39.2 11/01/2013   MCV 95.1 11/01/2013   PLT 226 11/01/2013   Lab Results  Component Value Date   CHOL 110 11/01/2013   HDL 30* 11/01/2013   LDLCALC 46 11/01/2013   TRIG 169* 11/01/2013   CHOLHDL 3.7 11/01/2013   Lab Results  Component Value Date   TSH 1.046 11/01/2013   Lab Results  Component Value Date   PSA 1.06 11/01/2013   PSA 0.80 10/31/2012   PSA 0.68 10/13/2011     Chemistry      Component Value Date/Time   NA 134* 11/01/2013 0825   K 4.3 11/01/2013 0825   CL 100 11/01/2013 0825   CO2 25 11/01/2013 0825   BUN 22 11/01/2013 0825   CREATININE 1.10 11/01/2013 0825   CREATININE 1.17 07/07/2010 1130      Component Value Date/Time   CALCIUM 9.7 11/01/2013 0825   ALKPHOS 32* 11/01/2013 0825   AST 17 11/01/2013 0825   ALT 19 11/01/2013 0825   BILITOT 0.8 11/01/2013 0825     Glucose 195  ASSESSMENT/PLAN:  Medicare annual wellness visit, subsequent  Essential hypertension, benign - controlled - Plan: amLODipine (NORVASC) 5 MG tablet  Pure hypercholesterolemia - TG slightly elevated, HDL lower than previously. LDL at goal, chol/HDL ratio at goal.  continue current meds. diet reviewed  Type II or unspecified type diabetes mellitus without mention of complication, not stated as uncontrolled - high sugar on recent c-met. f/u with Dr. KeBuddy Dutys scheduled. diet reviewed - Plan: [facility] Diabetes Foot Exam  Erectile dysfunction - controlled with Viagra, but now needing slightly higher dose  OSA on CPAP - doing well, compliant  Diarrhea - had improved with fiber/probiotic.  recent recurrence; reviewed need for lactose-free diet after episodes.  f/u with GI if ongoing. Use imodium prn  Recommended at least 30 minutes of aerobic activity at least 5 days/week; proper sunscreen use reviewed; healthy diet and alcohol  recommendations (less than or equal to 2 drinks/day) reviewed; regular seatbelt use; changing batteries in smoke detectors. Self-testicular exams. Immunization recommendations discussed, UTD--encouraged yearly high dose flu shots.  Recommended Prevnar-13, but have asked him to check with  his insurance to verify coverage (if not, recheck next year). Colonoscopy recommendations reviewed--due again 2018.    Weight loss encouraged, and reviewed ways to cut back on dietary intake/calories (ie alcohol, healthy snacks, portion sizes) and continue level of exercise through the hot summer months.  MOST form was reviewed and updated.   F/u 1 year, sooner prn

## 2013-11-05 NOTE — Patient Instructions (Signed)
  HEALTH MAINTENANCE RECOMMENDATIONS:  It is recommended that you get at least 30 minutes of aerobic exercise at least 5 days/week (for weight loss, you may need as much as 60-90 minutes). This can be any activity that gets your heart rate up. This can be divided in 10-15 minute intervals if needed, but try and build up your endurance at least once a week.  Weight bearing exercise is also recommended twice weekly.  Eat a healthy diet with lots of vegetables, fruits and fiber.  "Colorful" foods have a lot of vitamins (ie green vegetables, tomatoes, red peppers, etc).  Limit sweet tea, regular sodas and alcoholic beverages, all of which has a lot of calories and sugar.  Up to 2 alcoholic drinks daily may be beneficial for men (unless trying to lose weight, watch sugars).  Drink a lot of water.  Sunscreen of at least SPF 30 should be used on all sun-exposed parts of the skin when outside between the hours of 10 am and 4 pm (not just when at beach or pool, but even with exercise, golf, tennis, and yard work!)  Use a sunscreen that says "broad spectrum" so it covers both UVA and UVB rays, and make sure to reapply every 1-2 hours.  Remember to change the batteries in your smoke detectors when changing your clock times in the spring and fall.  Use your seat belt every time you are in a car, and please drive safely and not be distracted with cell phones and texting while driving.   Check with your insurance to see if they will cover Prevnar-13 (you had pneumovax at age 66).  This is now also recommended.  If they cover it, you can schedule nurse visit.  If they don't, we can check again next year.

## 2013-11-07 ENCOUNTER — Encounter: Payer: Self-pay | Admitting: Family Medicine

## 2013-11-08 DIAGNOSIS — Z6831 Body mass index (BMI) 31.0-31.9, adult: Secondary | ICD-10-CM | POA: Diagnosis not present

## 2013-11-08 DIAGNOSIS — E119 Type 2 diabetes mellitus without complications: Secondary | ICD-10-CM | POA: Diagnosis not present

## 2013-11-08 DIAGNOSIS — E669 Obesity, unspecified: Secondary | ICD-10-CM | POA: Diagnosis not present

## 2013-11-12 ENCOUNTER — Encounter: Payer: Self-pay | Admitting: *Deleted

## 2013-11-20 DIAGNOSIS — H902 Conductive hearing loss, unspecified: Secondary | ICD-10-CM | POA: Diagnosis not present

## 2013-11-20 DIAGNOSIS — H6120 Impacted cerumen, unspecified ear: Secondary | ICD-10-CM | POA: Diagnosis not present

## 2013-11-30 ENCOUNTER — Other Ambulatory Visit: Payer: Self-pay | Admitting: Family Medicine

## 2013-12-05 DIAGNOSIS — T148XXA Other injury of unspecified body region, initial encounter: Secondary | ICD-10-CM

## 2013-12-05 HISTORY — DX: Other injury of unspecified body region, initial encounter: T14.8XXA

## 2013-12-20 ENCOUNTER — Other Ambulatory Visit: Payer: Self-pay | Admitting: Family Medicine

## 2013-12-20 DIAGNOSIS — M545 Low back pain, unspecified: Secondary | ICD-10-CM | POA: Diagnosis not present

## 2013-12-20 DIAGNOSIS — M25559 Pain in unspecified hip: Secondary | ICD-10-CM | POA: Diagnosis not present

## 2013-12-24 ENCOUNTER — Encounter: Payer: Self-pay | Admitting: Internal Medicine

## 2013-12-24 ENCOUNTER — Encounter: Payer: Self-pay | Admitting: Family Medicine

## 2013-12-24 ENCOUNTER — Ambulatory Visit (INDEPENDENT_AMBULATORY_CARE_PROVIDER_SITE_OTHER): Payer: Medicare Other | Admitting: Family Medicine

## 2013-12-24 VITALS — BP 130/74 | HR 76 | Temp 98.5°F | Ht 68.5 in | Wt 205.0 lb

## 2013-12-24 DIAGNOSIS — R109 Unspecified abdominal pain: Secondary | ICD-10-CM

## 2013-12-24 DIAGNOSIS — R1032 Left lower quadrant pain: Secondary | ICD-10-CM

## 2013-12-24 DIAGNOSIS — M545 Low back pain, unspecified: Secondary | ICD-10-CM | POA: Diagnosis not present

## 2013-12-24 LAB — POCT URINALYSIS DIPSTICK
Bilirubin, UA: NEGATIVE
GLUCOSE UA: 100
Ketones, UA: NEGATIVE
Leukocytes, UA: NEGATIVE
Nitrite, UA: NEGATIVE
PH UA: 6
Protein, UA: NEGATIVE
RBC UA: NEGATIVE
SPEC GRAV UA: 1.01
UROBILINOGEN UA: NEGATIVE

## 2013-12-24 NOTE — Patient Instructions (Signed)
Eat a bland diet--avoiding spicy foods. If your pain gets worse with eating,then back down to either soft diet, or liquid (ie clears). Try simethicone (Gas-X) to help with the bloating and gas pains. Return for re-evaluation if increasing pain, fever, blood in stool  If you develop worsening pain, fevers, vomiting, blood in stool or other changes, we likely will need to draw blood and possibly do a CT scan. Go to the ER if acutely worse, otherwise follow up later this week or early next week if symptoms persist.

## 2013-12-24 NOTE — Progress Notes (Signed)
Chief Complaint  Patient presents with  . Abdominal Pain    has been having sharp abdominal pains x 3 days. Is concerned that he may have diverticulitis. (has been Googling)   Patient presents with complaint of pain in LLQ x 2 days.  It has been ongoing all day yesterday and today, slightly better now than it was earlier this morning. +bloating/gas.  He had a normal bowel movement this morning, followed by 2 looser stools later that morning (within 30 minutes of first stool).  No change in the pain.  He is complaining of a lot of gas and bloating.  Denies changes in his diet, except salsa the last couple of nights.  He has had problems with loose stools related to Metformin.  He discussed with Dr. Buddy Duty, and dose was changed to BID.  The diarrhea seemed to calm down some over the last month, "manageable".  The pain in LLQ is new.  He uses imodium sparingly (because he doesn't like the stopped up feeling that occurs afterwards--only uses if doing something without bathroom access), none taken in the last few days.  He had acute onset of back pain last Thursday while playing golf--felt a pop, left leg went numb.  [facility] was [facility] at [facility], who then sent him to [facility] after hours [facility].  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 It is still swollen, but no longer hurting.  He needed to use a walker for a couple of days.  He was treated with a pain shot.  He never filled the prescription for hydrocodone.  Colonoscopy 06/2011 by Dr. Olevia Perches showed multiple polyps, otherwise normal (no diverticulosis).  Past Medical History  Diagnosis Date  . Diabetes mellitus   . Hypertension   . Hyperlipidemia elevated chol/TG  . Sleep apnea on CPAP  . Allergy   . Erectile dysfunction   . Kidney stones 8/07 DrKimbrough  . History of shingles mid 90's  . 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 Colon polyp 07/07/11    adenomatous and hyperplastic  . Torn muscle 12/2013    ruptured  gluteus medius (Dr. Mardelle Matte)   Past Surgical History  Procedure Laterality Date  . Tonsillectomy and adenoidectomy  as a child  . Rotator cuff repair  right 07/2010   History   Social History  . Marital Status: Married    Spouse Name: N/A    Number of Children: 3  . Years of Education: N/A   Occupational History  . Not on file.   Social History Main Topics  . Smoking status: Former Smoker    Quit date: 10/01/1986  . Smokeless tobacco: Never Used  . Alcohol Use: Yes     Comment: daily, 2 drinks, 1 scotch and 1 glass wine  . Drug Use: No  . Sexual Activity: Yes    Partners: Female   Other Topics Concern  . Not on file   Social History Narrative   Lives with wife, 1 dog.  2 children in [place], 1 in [place], 6 grandchildren.  Retired from Radiation protection practitioner.  Sees grandkids daily   Outpatient Encounter Prescriptions as of 12/24/2013  Medication Sig Note  . amLODipine (NORVASC) 5 MG tablet TAKE 1 TABLET BY MOUTH ONCE DAILY   . aspirin 81 MG tablet Take 81 mg by mouth daily.     . CRESTOR 10 MG tablet take 1 tablet by mouth once daily   . fenofibrate (TRICOR) 145 MG tablet TAKE  1 TABLET BY MOUTH DAILY   . FIBER PO Take 2 each by mouth daily.   . insulin aspart (NOVOLOG) 100 UNIT/ML injection Inject 16 Units into the skin 2 (two) times daily.     . insulin NPH (HUMULIN N,NOVOLIN N) 100 UNIT/ML injection Inject 32 Units into the skin 2 (two) times daily. 32 U in morning, 40 U at night   . losartan-hydrochlorothiazide (HYZAAR) 100-12.5 MG per tablet take 1 tablet by mouth once daily   . metFORMIN (GLUCOPHAGE-XR) 500 MG 24 hr tablet Take 1,000 mg by mouth 2 (two) times daily.   . Probiotic Product (ALIGN PO) Take 1 tablet by mouth daily.   Marland Kitchen VIAGRA 100 MG tablet TAKE 1/2 TO 1 TABLET BY MOUTH AS NEEDED FOR ERECTILE DYSFUNCTION   . B-D INS SYRINGE 0.5CC/31GX5/16 31G X 5/16" 0.5 ML MISC  12/24/2013: Received from: External Pharmacy  . Loperamide HCl (IMODIUM A-D PO) Take 1 tablet by mouth as needed.  11/05/2013: Uses prn, hasn't used in a week   No Known Allergies  ROS: Denies nausea, vomiting, fevers. Appetite has been good. Denies blood in stool, black bowel movement, no fevers, chills.  Denies dysuria, blood in urine, back pain.  No chest pain, shortness of breath, URI symptoms, or other complaints.  See HPI.  PHYSICAL EXAM: BP 130/74  Pulse 76  Temp(Src) 98.5 F (36.9 C) (Tympanic)  Ht 5' 8.5" (1.74 m)  Wt 205 lb (92.987 kg)  BMI 30.71 kg/m2  Well developed, pleasant male, in no distress Neck: No lymphadenopathy, thyromegaly or mass Heart: regular rate and rhythm Lungs: clear bilaterally Back: no CVA tenderness Abdomen: normal bowel sounds.  No organomegaly or mass.  Mild tenderness at LLQ--no rebound tenderness or guarding.   Extremities: no edema Neuro: alert and oriented. Normal gait, strength, cranial nerves Psych: normal mood, affect, hygiene and grooming  Urine dip normal  ASSESSMENT/PLAN:  LLQ abdominal pain  Abdominal pain, unspecified site - Plan: POCT Urinalysis Dipstick  Treat conservatively for now--bland diet (and backing down to soft or clears if pain worsens). Return for re-eval (and labs and CT) if persistent/worsening pain. To ER if acutely worse.  If CT is normal, may need to f/u with Dr. Loman Brooklyn has had multiple polyps on colonoscopy, last of which was 2013.

## 2014-03-01 ENCOUNTER — Other Ambulatory Visit: Payer: Self-pay | Admitting: Family Medicine

## 2014-03-01 ENCOUNTER — Telehealth: Payer: Self-pay | Admitting: Family Medicine

## 2014-03-01 NOTE — Telephone Encounter (Signed)
Pt dropped off CPAP supply packing list.  States he needs you to write a prescription with all the CPAP supplies on the list and fax to [facility] # [phone] for his insurance to cover them Thanks (letter in your folder)

## 2014-03-04 NOTE — Telephone Encounter (Signed)
done

## 2014-03-05 ENCOUNTER — Ambulatory Visit (INDEPENDENT_AMBULATORY_CARE_PROVIDER_SITE_OTHER): Payer: Medicare Other | Admitting: Internal Medicine

## 2014-03-05 ENCOUNTER — Encounter: Payer: Self-pay | Admitting: Internal Medicine

## 2014-03-05 ENCOUNTER — Other Ambulatory Visit (INDEPENDENT_AMBULATORY_CARE_PROVIDER_SITE_OTHER): Payer: Medicare Other

## 2014-03-05 VITALS — BP 130/66 | HR 68 | Ht 67.75 in | Wt 205.5 lb

## 2014-03-05 DIAGNOSIS — R197 Diarrhea, unspecified: Secondary | ICD-10-CM

## 2014-03-05 DIAGNOSIS — R109 Unspecified abdominal pain: Secondary | ICD-10-CM

## 2014-03-05 LAB — HEPATIC FUNCTION PANEL
ALBUMIN: 4.6 g/dL (ref 3.5–5.2)
ALT: 29 U/L (ref 0–53)
AST: 23 U/L (ref 0–37)
Alkaline Phosphatase: 37 U/L — ABNORMAL LOW (ref 39–117)
Bilirubin, Direct: 0.1 mg/dL (ref 0.0–0.3)
TOTAL PROTEIN: 7.1 g/dL (ref 6.0–8.3)
Total Bilirubin: 1 mg/dL (ref 0.2–1.2)

## 2014-03-05 MED ORDER — METRONIDAZOLE 250 MG PO TABS: 250.0000 mg | ORAL_TABLET | Freq: Two times a day (BID) | ORAL | Status: DC

## 2014-03-05 MED ORDER — DICYCLOMINE HCL 20 MG PO TABS: 20.0000 mg | ORAL_TABLET | ORAL | Status: DC

## 2014-03-05 NOTE — Progress Notes (Signed)
[name] February 22, 1946 [id number]  Note: This dictation was prepared with Dragon digital system. Any transcriptional errors that result from this procedure are unintentional.   History of Present Illness:  This is a 68 year old white male with recent onset of diarrhea. Diarrhea is associated with urgency and occurs mostly in the mornings. He has had left lower quadrant abdominal discomfort. His eating habits as well as his drinking habits have not changed. He has been on metformin 1,000 mg twice a day for many years. He denies any blood per rectum. His stools were checked for pathogens and were negative. He complains of a lot of gas and bloating. The diarrhea has improved in the last several weeks but his bloating has increased. He takes fiber pills.  imodium when necessary. He drinks Scotch daily. A colonoscopy in 2002 showed a hyperplastic polyp. In 2007, an adenomatous polyp, in January 2013, 6 polyps were removed including a tubular adenoma and hyperplastic polyps.    Past Medical History  Diagnosis Date  . Diabetes mellitus   . Hypertension   . Hyperlipidemia elevated chol/TG  . Sleep apnea on CPAP  . Allergy   . Erectile dysfunction   . Kidney stones 8/07 DrKimbrough  . History of shingles mid 90's  . 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 Colon polyp 07/07/11    adenomatous and hyperplastic  . Torn muscle 12/2013    ruptured gluteus medius (Dr. Mardelle Matte)    Past Surgical History  Procedure Laterality Date  . Tonsillectomy and adenoidectomy  as a child  . Rotator cuff repair  right 07/2010    No Known Allergies  Family history and social history have been reviewed.  Review of Systems: Denies heartburn, this is a GI  The remainder of the 10 point ROS is negative except as outlined in the H&P  Physical Exam: General Appearance Well developed, in no distress mildly overweight Eyes  Non icteric  HEENT  Non traumatic, normocephalic  Mouth No lesion,  tongue papillated, no cheilosis Neck Supple without adenopathy, thyroid not enlarged, no carotid bruits, no JVD Lungs Clear to auscultation bilaterally COR Normal S1, normal S2, regular rhythm, no murmur, quiet precordium Abdomen soft with normal active bowel sounds. No tympany. No tenderness. Liver edge at costal margin Rectal normal rectal sphincter tone. Soft Hemoccult negative stool Extremities  No pedal edema Skin No lesions Neurological Alert and oriented x 3 Psychological Normal mood and affect  Assessment and Plan:   Problem #43 68 year old white male with abdominal discomfort localized to the left lower quadrant. He is a diabetic on metformin. The etiology of the diarrhea may be multifactorial. Bacterial overgrowth due to autonomic neuropathy is one possibility. Drug related diarrhea secondary to metformin is another possibility. Irritable bowel syndrome should also be considered. He has significant alcohol intake which could be affecting his GI function as well. I will start him on Flagyl 250 mg twice a day and have warned him not to use alcohol while taking Flagyl. I have also given him Bentyl 20 mg every morning for antispasmodic effect and Benefiber 1 tablespoon daily for a bulking effect. His colonoscopy will be due in January 2018.    Delfin Edis 03/05/2014

## 2014-03-05 NOTE — Patient Instructions (Signed)
We have sent the following medications to your pharmacy for you to pick up at your convenience: Flagyl 250 mg twice daily x 7 days. (Make certain NOT to drink alcohol while taking this medication!) Bentyl 20 mg every morning  Please purchase the following medications over the counter and take as directed: Benefiber 1 tablespoon daily  Your physician has requested that you go to the basement for the following lab work before leaving today: LFT's  CC:Dr Rita Ohara

## 2014-03-26 DIAGNOSIS — E119 Type 2 diabetes mellitus without complications: Secondary | ICD-10-CM | POA: Diagnosis not present

## 2014-03-26 DIAGNOSIS — H2513 Age-related nuclear cataract, bilateral: Secondary | ICD-10-CM | POA: Diagnosis not present

## 2014-03-26 DIAGNOSIS — H43813 Vitreous degeneration, bilateral: Secondary | ICD-10-CM | POA: Diagnosis not present

## 2014-04-17 DIAGNOSIS — E119 Type 2 diabetes mellitus without complications: Secondary | ICD-10-CM | POA: Diagnosis not present

## 2014-04-17 DIAGNOSIS — Z23 Encounter for immunization: Secondary | ICD-10-CM | POA: Diagnosis not present

## 2014-04-17 LAB — HEMOGLOBIN A1C: HEMOGLOBIN A1C: 7.8 % — AB (ref 4.0–6.0)

## 2014-05-20 DIAGNOSIS — E119 Type 2 diabetes mellitus without complications: Secondary | ICD-10-CM | POA: Diagnosis not present

## 2014-05-20 LAB — HEMOGLOBIN A1C: Hgb A1c MFr Bld: 7.8 % — AB (ref 4.0–6.0)

## 2014-05-29 ENCOUNTER — Encounter: Payer: Self-pay | Admitting: Internal Medicine

## 2014-05-29 ENCOUNTER — Other Ambulatory Visit: Payer: Self-pay | Admitting: Family Medicine

## 2014-06-02 DIAGNOSIS — J018 Other acute sinusitis: Secondary | ICD-10-CM | POA: Diagnosis not present

## 2014-06-02 DIAGNOSIS — H109 Unspecified conjunctivitis: Secondary | ICD-10-CM | POA: Diagnosis not present

## 2014-07-18 DIAGNOSIS — Z6830 Body mass index (BMI) 30.0-30.9, adult: Secondary | ICD-10-CM | POA: Diagnosis not present

## 2014-07-18 DIAGNOSIS — E669 Obesity, unspecified: Secondary | ICD-10-CM | POA: Diagnosis not present

## 2014-07-18 DIAGNOSIS — E119 Type 2 diabetes mellitus without complications: Secondary | ICD-10-CM | POA: Diagnosis not present

## 2014-07-18 LAB — LIPID PANEL
Cholesterol: 110 mg/dL (ref 0–200)
HDL: 30 mg/dL — AB (ref 35–70)
LDL CALC: 46 mg/dL
LDl/HDL Ratio: 3.7
TRIGLYCERIDES: 159 mg/dL (ref 40–160)

## 2014-07-18 LAB — HEMOGLOBIN A1C: Hgb A1c MFr Bld: 7.8 % — AB (ref 4.0–6.0)

## 2014-08-31 ENCOUNTER — Other Ambulatory Visit: Payer: Self-pay | Admitting: Family Medicine

## 2014-09-11 ENCOUNTER — Telehealth: Payer: Self-pay | Admitting: *Deleted

## 2014-09-11 NOTE — Telephone Encounter (Signed)
I believe it will be available in May.  Okay to change to generic (it will not be one of the $4 generics, but likely will have some cost savings eventually, maybe not much initially).  He can check with pharmacy to see if it is available now--I believe it is May.

## 2014-09-11 NOTE — Telephone Encounter (Signed)
Patient called and states that he saw on TV that there is now a generic for Crestor and would like to know if we could call this in for him if available. Thanks.

## 2014-09-11 NOTE — Telephone Encounter (Signed)
Spoke with patient after I called pharmacy-may be available soon. Pharmacy will automatically fill for generic once available. Pt informed.

## 2014-09-30 DIAGNOSIS — H6123 Impacted cerumen, bilateral: Secondary | ICD-10-CM | POA: Diagnosis not present

## 2014-10-04 ENCOUNTER — Other Ambulatory Visit: Payer: Self-pay | Admitting: Family Medicine

## 2014-10-04 DIAGNOSIS — E119 Type 2 diabetes mellitus without complications: Secondary | ICD-10-CM

## 2014-10-04 DIAGNOSIS — E7800 Pure hypercholesterolemia, unspecified: Secondary | ICD-10-CM

## 2014-10-04 DIAGNOSIS — Z5181 Encounter for therapeutic drug level monitoring: Secondary | ICD-10-CM

## 2014-10-04 DIAGNOSIS — I10 Essential (primary) hypertension: Secondary | ICD-10-CM

## 2014-10-04 DIAGNOSIS — Z125 Encounter for screening for malignant neoplasm of prostate: Secondary | ICD-10-CM

## 2014-10-04 NOTE — Telephone Encounter (Signed)
Okay to refill, but he has no appts scheduled.  He is due for med check+/AWV in June.  Please schedule, and put on waiting list, if needed

## 2014-10-04 NOTE — Addendum Note (Signed)
Addended by: Rita Ohara on: 10/04/2014 06:26 PM   Modules accepted: Orders

## 2014-10-04 NOTE — Telephone Encounter (Signed)
[name] scheduled the patient for a med. Check on 01/22/15. The patient would like to come in prior to the visit for lab work. Can you please put the lab order in for the patient ?

## 2014-10-04 NOTE — Telephone Encounter (Signed)
Is this okay to refill? 

## 2014-10-16 ENCOUNTER — Ambulatory Visit (INDEPENDENT_AMBULATORY_CARE_PROVIDER_SITE_OTHER): Payer: Medicare Other | Admitting: Family Medicine

## 2014-10-16 ENCOUNTER — Encounter: Payer: Self-pay | Admitting: Family Medicine

## 2014-10-16 ENCOUNTER — Encounter: Payer: Self-pay | Admitting: Podiatry

## 2014-10-16 ENCOUNTER — Ambulatory Visit (INDEPENDENT_AMBULATORY_CARE_PROVIDER_SITE_OTHER): Payer: Medicare Other | Admitting: Podiatry

## 2014-10-16 VITALS — BP 140/81 | HR 77 | Resp 18

## 2014-10-16 VITALS — BP 128/64 | HR 68 | Temp 100.0°F | Ht 68.5 in | Wt 201.0 lb

## 2014-10-16 DIAGNOSIS — B351 Tinea unguium: Secondary | ICD-10-CM

## 2014-10-16 DIAGNOSIS — M79676 Pain in unspecified toe(s): Secondary | ICD-10-CM | POA: Diagnosis not present

## 2014-10-16 DIAGNOSIS — J069 Acute upper respiratory infection, unspecified: Secondary | ICD-10-CM

## 2014-10-16 MED ORDER — AZITHROMYCIN 250 MG PO TABS: ORAL_TABLET | ORAL | Status: DC

## 2014-10-16 NOTE — Progress Notes (Signed)
   Subjective:    Patient ID: [name], male    DOB: [birth year], 69 y.o.   MRN: [id number]  HPI  [name] presents the office today for diabetic risk assessment and for painful, elongated, thickened toenails which she is unable to trim himself. He states he is been diabetic for approximate 20 years. Denies any history of ulceration or claudication symptoms. He denies any redness or drainage on nail sites. No other complaints at this time.   Review of Systems  Endocrine:       DIABETIC  All other systems reviewed and are negative.      Objective:   Physical Exam AAO 3, NAD DP/PT pulses palpable, CRT less than 3 seconds Protective sensation intact with Simms Weinstein monofilament, vibratory sensation intact, Achilles tendon reflex intact. Nails are hypertrophic, dystrophic, elongated, brittle, discolored 10. There is no swelling erythema or drainage on nail sites. There is tenderness to palpation along nails 1-5 bilaterally. No open lesions or pre-ulcerative lesions identified bilaterally.  No areas of tenderness to bilateral lower extremities. No overlying edema, erythema, increase in warmth bilaterally. MMT 5/5, ROM WNL No pain with calf compression, swelling, warmth, erythema.      Assessment & Plan:  69 year old male presents for diabetic risk assessment, symptomatic onychomycosis -Treatment options were discussed with the patient including alternatives, risks, complications. -Nail sharply debrided 10 without complication/bleeding. -Discussed the importance of daily foot inspection. Recommended the patient to call the office and medially should there be any changes or concerns. -Follow-up in 3 months or sooner if any problems are to arise. In the meantime call the office with any questions, concerns, change in symptoms.

## 2014-10-16 NOTE — Progress Notes (Signed)
Chief Complaint  Patient presents with  . Cough    and some nasal and chest congestion. Feels kind of "wheezy." Started this past Monday-going to [place] where there are no doctor this Friday ([place]).   Two or three days ago he started with tight chest, cough, mostly during the day.  Cough is just starting to break up some, but still not able to get it up.  Feels a little tight/wheezy, and sore from his back to his chest. No other muscles aches.  Denies shortness of breath.  He starting having some nasal congestion today, blowing out clear mucus.  Denies sinus pain, sore throat.  He feels a little fatigued right now; denies myalgias.  Some watery eyes just today, but no sneezing, itchy eyes or other allergy symptoms.  +sick contacts--grandkids (77 yo) were sick with upper respiratory symptoms last week.  He leaves Friday for [facility], where he will remain for 11 days, without access to a pharmacy. He would like antibiotic to take with him just in case.  PMH, PSH, SH reviewed.  Outpatient Encounter Prescriptions as of 10/16/2014  Medication Sig  . amLODipine (NORVASC) 5 MG tablet TAKE 1 TABLET BY MOUTH ONCE DAILY  . aspirin 81 MG tablet Take 81 mg by mouth daily.    . B-D INS SYRINGE 0.5CC/31GX5/16 31G X 5/16" 0.5 ML MISC   . BD INSULIN SYRINGE ULTRAFINE 31G X 15/64" 1 ML MISC   . CRESTOR 10 MG tablet take 1 tablet by mouth once daily  . fenofibrate (TRICOR) 145 MG tablet take 1 tablet by mouth once daily  . FIBER PO Take 2 each by mouth daily.  Marland Kitchen JARDIANCE 10 MG TABS tablet   . losartan-hydrochlorothiazide (HYZAAR) 100-12.5 MG per tablet take 1 tablet by mouth once daily  . metFORMIN (GLUCOPHAGE-XR) 500 MG 24 hr tablet Take 1,000 mg by mouth 2 (two) times daily.  Marland Kitchen NOVOLIN 70/30 RELION (70-30) 100 UNIT/ML injection Inject 40 Units into the skin daily with breakfast. And 56 units with dinner  . azithromycin (ZITHROMAX) 250 MG tablet Take 2 tablets by mouth on first day, then  1 tablet by mouth on days 2 through 5  . Loperamide HCl (IMODIUM A-D PO) Take 1 tablet by mouth as needed.  Marland Kitchen VIAGRA 100 MG tablet TAKE 1/2 -1 TABLET BY MOUTH AS NEEDED FOR ERECTILE DYSFUCTION (Patient not taking: Reported on 10/16/2014)  . [DISCONTINUED] dicyclomine (BENTYL) 20 MG tablet Take 1 tablet (20 mg total) by mouth every morning.  . [DISCONTINUED] insulin aspart (NOVOLOG) 100 UNIT/ML injection Inject 16 Units into the skin 2 (two) times daily.    . [DISCONTINUED] insulin NPH (HUMULIN N,NOVOLIN N) 100 UNIT/ML injection Inject 32 Units into the skin 2 (two) times daily. 32 U in morning, 40 U at night  . [DISCONTINUED] metroNIDAZOLE (FLAGYL) 250 MG tablet Take 1 tablet (250 mg total) by mouth 2 (two) times daily.   No facility-administered encounter medications on file as of 10/16/2014.   No Known Allergies  ROS:  Denies chills, nausea, vomiting, diarrhea, bleeding, bruising, rashes. No headaches, dizziness.  No exertional shortness of breath or chest pain. See HPI  PHYSICAL EXAM: BP 128/64 mmHg  Pulse 68  Temp(Src) 100 F (37.8 C) (Tympanic)  Ht 5' 8.5" (1.74 m)  Wt 201 lb (91.173 kg)  BMI 30.11 kg/m2 Well developed, pleasant male, in no distress.  No coughing HEENT: PERRL, EOMI, conjunctiva clear.  TM' s and EAC's normal. Nasal mucosa is mild-  moderately edematous, no erythema, clear mucus, no purulence. OP is clear, no erythema or lesions. Sinuses nontender Neck: No lymphadenopathy or mass Heart: regular rate and rhythm without murmur Lungs: clear bilaterally, no wheezes, rales, ronchi Skin: no rash Neuro: alert and oriented. Cranial nerves intact. Normal gait, strength   ASSESSMENT/PLAN:  Acute upper respiratory infection - Plan: azithromycin (ZITHROMAX) 250 MG tablet  URI, viral, likely from sick grandchildren. Supportive management reviewed. Start antibiotics next week, if symptoms persist/worsen (he will be away, so rx given now, since no access to  pharmacy). Risks/side effects of meds reviewed.  Avoided amox given ages of grandchildren, suspect resistance.   Drink plenty of fluids. Use Coricidin HBP for congestion (nasal). Use Mucinex (plain or DM) to help loosen the mucus (and use the DM if you need a cough suppressant, versus getting a separate Delsym syrup, to use only if/when needed). Don't use DM version of Mucinex along with Delsym--they have the same dextromethorphan ingredient.  Consider sinus rinses if you develop significant sinus pain/congestion. Start the antibiotics only if your symptoms are worsening, rather than improving--usually waiting at least 7-10 days (from the first day of illness)--start next week if getting worse rather than better.  Get seen if you develop shortness of breath, ongoing high fevers, coughing up blood, or any other worsening symptoms, despite starting the antibiotics.

## 2014-10-16 NOTE — Patient Instructions (Signed)
   Drink plenty of fluids. Use Coricidin HBP for congestion (nasal). Use Mucinex (plain or DM) to help loosen the mucus (and use the DM if you need a cough suppressant, versus getting a separate Delsym syrup, to use only if/when needed). Don't use DM version of Mucinex along with Delsym--they have the same dextromethorphan ingredient.  Consider sinus rinses if you develop significant sinus pain/congestion. Start the antibiotics only if your symptoms are worsening, rather than improving--usually waiting at least 7-10 days (from the first day of illness)--start next week if getting worse rather than better.  Get seen if you develop shortness of breath, ongoing high fevers, coughing up blood, or any other worsening symptoms, despite starting the antibiotics.

## 2014-10-20 ENCOUNTER — Encounter: Payer: Self-pay | Admitting: Podiatry

## 2014-10-30 DIAGNOSIS — E669 Obesity, unspecified: Secondary | ICD-10-CM | POA: Diagnosis not present

## 2014-10-30 DIAGNOSIS — Z6830 Body mass index (BMI) 30.0-30.9, adult: Secondary | ICD-10-CM | POA: Diagnosis not present

## 2014-10-30 DIAGNOSIS — E119 Type 2 diabetes mellitus without complications: Secondary | ICD-10-CM | POA: Diagnosis not present

## 2014-10-30 DIAGNOSIS — Z794 Long term (current) use of insulin: Secondary | ICD-10-CM | POA: Diagnosis not present

## 2014-10-30 LAB — HEMOGLOBIN A1C: HEMOGLOBIN A1C: 6.7 % — AB (ref 4.0–6.0)

## 2014-11-01 ENCOUNTER — Encounter: Payer: Self-pay | Admitting: *Deleted

## 2014-11-20 ENCOUNTER — Other Ambulatory Visit: Payer: Self-pay | Admitting: Family Medicine

## 2014-11-30 ENCOUNTER — Other Ambulatory Visit: Payer: Self-pay | Admitting: Family Medicine

## 2014-12-02 ENCOUNTER — Other Ambulatory Visit: Payer: Self-pay

## 2014-12-15 ENCOUNTER — Other Ambulatory Visit: Payer: Self-pay | Admitting: Family Medicine

## 2015-01-16 ENCOUNTER — Telehealth: Payer: Self-pay | Admitting: Family Medicine

## 2015-01-16 NOTE — Telephone Encounter (Signed)
He already has future orders in the system. Okay to schedule for labs

## 2015-01-16 NOTE — Telephone Encounter (Signed)
Pt called and would like to come in early for labs. Please advise pt and add orders. Pt can be reached at [phone].

## 2015-01-17 ENCOUNTER — Encounter: Payer: Self-pay | Admitting: Podiatry

## 2015-01-17 ENCOUNTER — Ambulatory Visit (INDEPENDENT_AMBULATORY_CARE_PROVIDER_SITE_OTHER): Payer: Medicare Other | Admitting: Podiatry

## 2015-01-17 ENCOUNTER — Ambulatory Visit: Payer: Medicare Other | Admitting: Podiatry

## 2015-01-17 VITALS — BP 126/77 | HR 69 | Resp 17

## 2015-01-17 DIAGNOSIS — B351 Tinea unguium: Secondary | ICD-10-CM | POA: Diagnosis not present

## 2015-01-17 DIAGNOSIS — M79676 Pain in unspecified toe(s): Secondary | ICD-10-CM | POA: Diagnosis not present

## 2015-01-17 NOTE — Progress Notes (Signed)
Patient ID: [name], male   DOB: Feb 09, 1946, 69 y.o.   MRN: [id number] Complaint:  Visit Type: Patient returns to my office for continued preventative foot care services. Complaint: Patient states" my nails have grown long and thick and become painful to walk and wear shoes" Patient has been diagnosed with DM with no foot complications. The patient presents for preventative foot care services. No changes to ROS  Podiatric Exam: Vascular: dorsalis pedis and posterior tibial pulses are palpable bilateral. Capillary return is immediate. Temperature gradient is WNL. Skin turgor WNL  Sensorium: Normal Semmes Weinstein monofilament test. Normal tactile sensation bilaterally. Nail Exam: Pt has thick disfigured discolored nails with subungual debris noted bilateral entire nail hallux through fifth toenails Ulcer Exam: There is no evidence of ulcer or pre-ulcerative changes or infection. Orthopedic Exam: Muscle tone and strength are WNL. No limitations in general ROM. No crepitus or effusions noted. Foot type and digits show no abnormalities. Bony prominences are unremarkable. Skin: No Porokeratosis. No infection or ulcers  Diagnosis:  Onychomycosis, , Pain in right toe, pain in left toes  Treatment & Plan Procedures and Treatment: Consent by patient was obtained for treatment procedures. The patient understood the discussion of treatment and procedures well. All questions were answered thoroughly reviewed. Debridement of mycotic and hypertrophic toenails, 1 through 5 bilateral and clearing of subungual debris. No ulceration, no infection noted.  Return Visit-Office Procedure: Patient instructed to return to the office for a follow up visit 3 months for continued evaluation and treatment.

## 2015-01-17 NOTE — Telephone Encounter (Signed)
Pt called made lab appt.

## 2015-01-20 ENCOUNTER — Other Ambulatory Visit: Payer: Medicare Other

## 2015-01-20 ENCOUNTER — Other Ambulatory Visit: Payer: Self-pay | Admitting: Family Medicine

## 2015-01-20 DIAGNOSIS — E7800 Pure hypercholesterolemia, unspecified: Secondary | ICD-10-CM

## 2015-01-20 DIAGNOSIS — Z5181 Encounter for therapeutic drug level monitoring: Secondary | ICD-10-CM | POA: Diagnosis not present

## 2015-01-20 DIAGNOSIS — I10 Essential (primary) hypertension: Secondary | ICD-10-CM

## 2015-01-20 DIAGNOSIS — E119 Type 2 diabetes mellitus without complications: Secondary | ICD-10-CM

## 2015-01-20 DIAGNOSIS — Z125 Encounter for screening for malignant neoplasm of prostate: Secondary | ICD-10-CM | POA: Diagnosis not present

## 2015-01-20 LAB — CBC WITH DIFFERENTIAL/PLATELET
Basophils Absolute: 0 10*3/uL (ref 0.0–0.1)
Basophils Relative: 1 % (ref 0–1)
EOS ABS: 0.2 10*3/uL (ref 0.0–0.7)
Eosinophils Relative: 4 % (ref 0–5)
HCT: 42.8 % (ref 39.0–52.0)
HEMOGLOBIN: 14.2 g/dL (ref 13.0–17.0)
LYMPHS ABS: 1.3 10*3/uL (ref 0.7–4.0)
Lymphocytes Relative: 31 % (ref 12–46)
MCH: 32 pg (ref 26.0–34.0)
MCHC: 33.2 g/dL (ref 30.0–36.0)
MCV: 96.4 fL (ref 78.0–100.0)
MONOS PCT: 9 % (ref 3–12)
MPV: 10.2 fL (ref 8.6–12.4)
Monocytes Absolute: 0.4 10*3/uL (ref 0.1–1.0)
NEUTROS PCT: 55 % (ref 43–77)
Neutro Abs: 2.3 10*3/uL (ref 1.7–7.7)
PLATELETS: 222 10*3/uL (ref 150–400)
RBC: 4.44 MIL/uL (ref 4.22–5.81)
RDW: 14.4 % (ref 11.5–15.5)
WBC: 4.1 10*3/uL (ref 4.0–10.5)

## 2015-01-20 LAB — LIPID PANEL
CHOLESTEROL: 129 mg/dL (ref 125–200)
HDL: 37 mg/dL — AB (ref >=40)
LDL Cholesterol: 69 mg/dL (ref <130)
TRIGLYCERIDES: 116 mg/dL (ref <150)
Total CHOL/HDL Ratio: 3.5 Ratio (ref <=5.0)
VLDL: 23 mg/dL (ref <30)

## 2015-01-20 LAB — COMPREHENSIVE METABOLIC PANEL
ALK PHOS: 23 U/L — AB (ref 40–115)
ALT: 20 U/L (ref 9–46)
AST: 18 U/L (ref 10–35)
Albumin: 4.4 g/dL (ref 3.6–5.1)
BUN: 19 mg/dL (ref 7–25)
CALCIUM: 9.9 mg/dL (ref 8.6–10.3)
CO2: 25 mmol/L (ref 20–31)
Chloride: 104 mmol/L (ref 98–110)
Creat: 1.24 mg/dL (ref 0.70–1.25)
Glucose, Bld: 120 mg/dL — ABNORMAL HIGH (ref 65–99)
POTASSIUM: 4.2 mmol/L (ref 3.5–5.3)
Sodium: 139 mmol/L (ref 135–146)
TOTAL PROTEIN: 6.7 g/dL (ref 6.1–8.1)
Total Bilirubin: 0.8 mg/dL (ref 0.2–1.2)

## 2015-01-20 LAB — TSH: TSH: 1.014 u[IU]/mL (ref 0.350–4.500)

## 2015-01-21 LAB — PSA, MEDICARE: PSA: 1.2 ng/mL (ref <=4.00)

## 2015-01-22 ENCOUNTER — Ambulatory Visit (INDEPENDENT_AMBULATORY_CARE_PROVIDER_SITE_OTHER): Payer: Medicare Other | Admitting: Family Medicine

## 2015-01-22 ENCOUNTER — Encounter: Payer: Self-pay | Admitting: Family Medicine

## 2015-01-22 VITALS — BP 112/68 | HR 76 | Ht 68.5 in | Wt 202.8 lb

## 2015-01-22 DIAGNOSIS — Z23 Encounter for immunization: Secondary | ICD-10-CM | POA: Diagnosis not present

## 2015-01-22 DIAGNOSIS — E119 Type 2 diabetes mellitus without complications: Secondary | ICD-10-CM

## 2015-01-22 DIAGNOSIS — Z1211 Encounter for screening for malignant neoplasm of colon: Secondary | ICD-10-CM | POA: Diagnosis not present

## 2015-01-22 DIAGNOSIS — I10 Essential (primary) hypertension: Secondary | ICD-10-CM | POA: Diagnosis not present

## 2015-01-22 DIAGNOSIS — D126 Benign neoplasm of colon, unspecified: Secondary | ICD-10-CM

## 2015-01-22 DIAGNOSIS — E7800 Pure hypercholesterolemia, unspecified: Secondary | ICD-10-CM

## 2015-01-22 DIAGNOSIS — Z1159 Encounter for screening for other viral diseases: Secondary | ICD-10-CM

## 2015-01-22 DIAGNOSIS — Z0000 Encounter for general adult medical examination without abnormal findings: Secondary | ICD-10-CM | POA: Diagnosis not present

## 2015-01-22 DIAGNOSIS — G4733 Obstructive sleep apnea (adult) (pediatric): Secondary | ICD-10-CM | POA: Diagnosis not present

## 2015-01-22 DIAGNOSIS — Z9989 Dependence on other enabling machines and devices: Secondary | ICD-10-CM

## 2015-01-22 DIAGNOSIS — Z125 Encounter for screening for malignant neoplasm of prostate: Secondary | ICD-10-CM

## 2015-01-22 LAB — HEMOCCULT GUIAC POC 1CARD (OFFICE): Fecal Occult Blood, POC: NEGATIVE

## 2015-01-22 MED ORDER — ROSUVASTATIN CALCIUM 20 MG PO TABS: 10.0000 mg | ORAL_TABLET | Freq: Every day | ORAL | Status: DC

## 2015-01-22 NOTE — Patient Instructions (Addendum)
HEALTH MAINTENANCE RECOMMENDATIONS:  It is recommended that you get at least 30 minutes of aerobic exercise at least 5 days/week (for weight loss, you may need as much as 60-90 minutes). This can be any activity that gets your heart rate up. This can be divided in 10-15 minute intervals if needed, but try and build up your endurance at least once a week.  Weight bearing exercise is also recommended twice weekly.  Eat a healthy diet with lots of vegetables, fruits and fiber.  "Colorful" foods have a lot of vitamins (ie green vegetables, tomatoes, red peppers, etc).  Limit sweet tea, regular sodas and alcoholic beverages, all of which has a lot of calories and sugar.  Up to 2 alcoholic drinks daily may be beneficial for men (unless trying to lose weight, watch sugars).  Drink a lot of water.  Sunscreen of at least SPF 30 should be used on all sun-exposed parts of the skin when outside between the hours of 10 am and 4 pm (not just when at beach or pool, but even with exercise, golf, tennis, and yard work!)  Use a sunscreen that says "broad spectrum" so it covers both UVA and UVB rays, and make sure to reapply every 1-2 hours.  Remember to change the batteries in your smoke detectors when changing your clock times in the spring and fall.  Use your seat belt every time you are in a car, and please drive safely and not be distracted with cell phones and texting while driving.  Check with [facility] to see about costs of generic sildenafil (20mg  tablet).  This would be cash-pay, not through your insurance, and taking 2-3 at a time would be similar to taking 1/2 tablet of the 100mg  Viagra.  We are changing your Crestor to 20mg , and take 1/2 tablet daily. This is done simply for cost purposes, and should be equivalent    Mr. Merriweather , Thank you for taking time to come for your Medicare Wellness Visit. I appreciate your ongoing commitment to your health goals. Please review the following plan we discussed  and let me know if I can assist you in the future.   These are the goals we discussed: Goals    None      This is a list of the screening recommended for you and due dates:  Health Maintenance  Topic Date Due  .  Hepatitis C: One time screening is recommended by Center for Disease Control  (CDC) for  adults born from [birth year] through 1964.   20-Jun-1945  . Eye exam for diabetics  10/02/1956  . Urine Protein Check  10/02/1956  . Pneumonia vaccines (2 of 2 - PCV13) 03/01/2013  . Complete foot exam   11/06/2014  . Flu Shot  01/06/2015  . Hemoglobin A1C  05/02/2015  . Colon Cancer Screening  07/06/2016  . Tetanus Vaccine  03/01/2022  . Shingles Vaccine  Completed    We will try adding the Hepatitis C antibody on to the labs drawn earlier this week. The diabetic eye exam appears overdue simply because your eye doctor doesn't send me the reports (probably sends to Dr. Buddy Duty). Dr. Buddy Duty is also likely doing the urine microalbumin checks (protein in the urine). We are giving you Prevnar today, so your pneumonia shots will be up to date.   Please send [place] copies of your NEW living will and healthcare power of attorney once they are updated.  We will see you in 1 year; have Dr. Buddy Duty  do your labs in February and send [place] copies (like he did last year.)

## 2015-01-22 NOTE — Progress Notes (Signed)
Chief Complaint  Patient presents with  . Med check plus    AWV, nonfasting-had labs done already. Would like to know if you can add on Hep C test due to age. Wants to know if Crestor and/or Jardiance will be generic soon.   [name] is a 69 y.o. male who presents for annual wellness visit and follow-up on chronic medical conditions.  He had his labs done prior to his visit.  Hypertension follow-up: Blood pressures at the pharmacy are usually 120/70. Denies dizziness, headaches, chest pain. Denies side effects of medications.   Hyperlipidemia follow-up: Patient is reportedly following a low-fat, low cholesterol diet. Compliant with taking his Crestor and Tricor, and denies medication side effects. He is concerned about the cost of the Crestor.  ED: 1/2 tablet of Viagra is usually effective.  OSA: Uses CPAP machine nightly. He feels better when using it. Denies any problems.  DM: managed by [name]. His last A1c was 6.7 in May 2016.  Denies hypoglycemia (infrequent, every few months). Denies polydipsia, polyuria. Last eye exam was 03/2014, goes yearly.  Gets routine care at podiatrist.  Immunization History  Administered Date(s) Administered  . Influenza Split 04/19/2011, 03/01/2012  . Influenza-Unspecified 04/07/2014  . Pneumococcal Polysaccharide-23 06/07/2004, 03/01/2012  . Td 06/07/2002  . Tdap 03/01/2012  . Zoster 04/18/2008   Last colonoscopy: 07/07/11, due again in 5 years  Last PSA: with recent bloodwork Dentist: once yearly  Ophtho: once yearly, in the fall  Exercise: walks 3 miles, 6-7days/week (less (only 25-30 mins) recently in the extreme heat), and golfing (cart). Does some weights at home, but infrequently.  Other doctors caring for patient include: [name] for diabetes.  Dr. Syrian Arab Stone for ophtho  Dr. Miguel Stone  Dr. Gayla Stone  Dr. Lupton--dermatologist  Depression, Fall and ADL screen were all completely unremarkable, see  Epic questionnaires.  End of Life Discussion:  Patient has a living will and medical power of attorney. He is in the process of updating these with his attorney.  Past Medical History  Diagnosis Date  . Diabetes mellitus   . Hypertension   . Hyperlipidemia elevated chol/TG  . Sleep apnea on CPAP  . Allergy   . Erectile dysfunction   . Kidney stones 8/07 [facility]  . History of shingles mid 90's  . 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 Colon polyp 07/07/11    adenomatous and hyperplastic  . Torn muscle 12/2013    ruptured gluteus medius (Dr. Mardelle Stone)    Past Surgical History  Procedure Laterality Date  . Tonsillectomy and adenoidectomy  as a child  . Rotator cuff repair  right 07/2010    Social History   Social History  . Marital Status: Married    Spouse Name: N/A  . Number of Children: 3  . Years of Education: N/A   Occupational History  . Retired    Social History Main Topics  . Smoking status: Former Smoker    Quit date: 10/01/1986  . Smokeless tobacco: Never Used  . Alcohol Use: Yes     Comment: daily, 2 drinks, 1 scotch and 1 glass wine  . Drug Use: No  . Sexual Activity:    Partners: Female   Other Topics Concern  . Not on file   Social History Narrative   Lives with wife, 1 dog.  2 children in [place], 1 in [place], 6 grandchildren.  Retired from Radiation protection practitioner.  Sees grandkids daily    Family History  Problem Relation Age of Onset  . Diabetes Mother   . Hyperlipidemia Mother   . Hypertension Mother   . Alzheimer's disease Mother   . Diabetes Father   . Hypertension Father   . Hyperlipidemia Brother   . Hypertension Maternal Grandmother   . Stroke Maternal Grandmother   . Heart attack Maternal Grandfather   . Prostate cancer Paternal Grandfather   . Hypertension Brother   . Colon cancer Neg Hx   . Stomach cancer Neg Hx     Outpatient Encounter Prescriptions as of 01/22/2015  Medication Sig Note  . amLODipine (NORVASC) 5 MG tablet  take 1 tablet by mouth once daily   . aspirin 81 MG tablet Take 81 mg by mouth daily.     . B-D INS SYRINGE 0.5CC/31GX5/16 31G X 5/16" 0.5 ML MISC  12/24/2013: Received from: External Pharmacy  . BD INSULIN SYRINGE ULTRAFINE 31G X 15/64" 1 ML MISC  10/16/2014: Received from: External Pharmacy  . CRESTOR 10 MG tablet take 1 tablet by mouth once daily   . fenofibrate (TRICOR) 145 MG tablet take 1 tablet by mouth once daily   . FIBER PO Take 2 each by mouth daily.   Marland Kitchen JARDIANCE 10 MG TABS tablet  10/16/2014: Received from: External Pharmacy  . losartan-hydrochlorothiazide (HYZAAR) 100-12.5 MG per tablet take 1 tablet by mouth once daily   . metFORMIN (GLUCOPHAGE-XR) 500 MG 24 hr tablet Take 1,000 mg by mouth 2 (two) times daily.   Marland Kitchen NOVOLIN 70/30 RELION (70-30) 100 UNIT/ML injection Inject 40 Units into the skin daily with breakfast. And 60 units with dinner 10/16/2014: Received from: External Pharmacy  . VIAGRA 100 MG tablet TAKE 1/2 -1 TABLET BY MOUTH AS NEEDED FOR ERECTILE DYSFUCTION   . Loperamide HCl (IMODIUM A-D PO) Take 1 tablet by mouth as needed. 10/16/2014: Uses prn, not often  . [DISCONTINUED] azithromycin (ZITHROMAX) 250 MG tablet Take 2 tablets by mouth on first day, then 1 tablet by mouth on days 2 through 5   . [DISCONTINUED] fenofibrate (TRICOR) 145 MG tablet take 1 tablet by mouth once daily    No facility-administered encounter medications on file as of 01/22/2015.    No Known Allergies  ROS: The patient denies anorexia, fever, weight changes, headaches, vision loss, decreased hearing, ear pain, hoarseness, chest pain, palpitations, dizziness, syncope, dyspnea on exertion, cough, swelling, nausea, vomiting, constipation, melena, hematochezia, indigestion/heartburn, hematuria, incontinence, nocturia, weakened urine stream, dysuria, genital lesions, joint pains, numbness, tingling, weakness, tremor, suspicious skin lesions, depression, anxiety, abnormal bleeding/bruising, or enlarged  lymph nodes. +erectile dysfunction  +bloating and diarrhea, now just intermittently (much improved from last year, after taking a week of flagyl, prescribed by [name] last year).  PHYSICAL EXAM:  BP 112/68 mmHg  Pulse 76  Ht 5' 8.5" (1.74 m)  Wt 202 lb 12.8 oz (91.989 kg)  BMI 30.38 kg/m2  General Appearance:  Alert, cooperative, no distress, appears stated age   Head:  Normocephalic, without obvious abnormality, atraumatic   Eyes:  PERRL, conjunctiva/corneas clear, EOM's intact, fundi  benign   Ears:  Normal TM's and external ear canals   Nose:  Nares normal, mucosa normal, no drainage or sinus tenderness   Throat:  Lips, mucosa, and tongue normal; teeth and gums normal   Neck:  Supple, no lymphadenopathy; thyroid: no enlargement/tenderness/nodules; no carotid  bruit or JVD   Back:  Spine nontender, no curvature, ROM normal, no CVA tenderness   Lungs:  Clear to auscultation bilaterally without wheezes,  rales or ronchi; respirations unlabored   Chest Wall:  No tenderness or deformity   Heart:  Regular rate and rhythm, S1 and S2 normal, no murmur, rub  or gallop   Breast Exam:  No chest wall tenderness, masses or gynecomastia   Abdomen:  Soft, non-tender, nondistended, normoactive bowel sounds,  no masses, no hepatosplenomegaly   Genitalia:  Normal male external genitalia without lesions. Testicles without masses. No inguinal hernias.   Rectal:  Normal sphincter tone, no masses or tenderness; guaiac negative stool. Small noninflamed external hemorrhoidal tag. Prostate smooth, no nodules, mild-mod enlarged.   Extremities:  No clubbing, cyanosis or edema   Pulses:  2+ and symmetric all extremities   Skin:  Skin color, texture, turgor normal, no rashes. Dry, hyperkeratotic patches on bilateral LE's. Rash mid chest is almost completely clear today (pt states present x years, somewhat follicular). There is a 89mm flesh colored nodule on right  foot, medial heel area  Lymph nodes:  Cervical, supraclavicular, and axillary nodes normal   Neurologic:  CNII-XII intact, normal strength, sensation and gait; reflexes 2+ and symmetric throughout    Psych: Normal mood, affect, hygiene and grooming.        Diabetic foot exam was performed: Thickened/discolored R toenails (1, 2, 3, 5 on the right) Decreased sensation over heels, somewhat calloused. Remainder of foot exam is normal. There is 22mm flesh colored nodule on right foot, medial heel area, unchanged from last year.  Lab Results  Component Value Date   WBC 4.1 01/20/2015   HGB 14.2 01/20/2015   HCT 42.8 01/20/2015   MCV 96.4 01/20/2015   PLT 222 01/20/2015   Lab Results  Component Value Date   PSA 1.20 01/20/2015   PSA 1.06 11/01/2013   PSA 0.80 10/31/2012     Chemistry      Component Value Date/Time   NA 139 01/20/2015 0001   K 4.2 01/20/2015 0001   CL 104 01/20/2015 0001   CO2 25 01/20/2015 0001   BUN 19 01/20/2015 0001   CREATININE 1.24 01/20/2015 0001   CREATININE 1.17 07/07/2010 1130      Component Value Date/Time   CALCIUM 9.9 01/20/2015 0001   ALKPHOS 23* 01/20/2015 0001   AST 18 01/20/2015 0001   ALT 20 01/20/2015 0001   BILITOT 0.8 01/20/2015 0001     Fasting glucose 120  Lab Results  Component Value Date   TSH 1.014 01/20/2015   Lab Results  Component Value Date   CHOL 129 01/20/2015   HDL 37* 01/20/2015   LDLCALC 69 01/20/2015   TRIG 116 01/20/2015   CHOLHDL 3.5 01/20/2015    ASSESSMENT/PLAN:  Medicare annual wellness visit, subsequent  Essential hypertension, benign - controlled  Pure hypercholesterolemia - at goal on current regimen. Change to 20mg  Crestor, 1/2 tablet, for cost savings. Discussed generic fenofibrate, ?not as safe with statin? - Plan: rosuvastatin (CRESTOR) 20 MG tablet  OSA on CPAP  Adenomatous colon polyp  Type 2 diabetes mellitus without complication - managed by [name];  controlled.  Weight loss encouraged  Immunization due - Plan: Pneumococcal conjugate vaccine 13-valent  Need for hepatitis C screening test - will add on to blood drawn Monday  Prostate cancer screening   Return for high dose flu shot (not yet available)  F/u 1 year, sooner if higher blood pressures or any concerns. [name] to do labs in 6 mos and send [place] results as he did last February.  Pt to get [place] copy of  updated Living Will, Healthcare power of attorney.  Recommended at least 30 minutes of aerobic activity at least 5 days/week; proper sunscreen use reviewed; healthy diet and alcohol recommendations (less than or equal to 2 drinks/day) reviewed; regular seatbelt use; changing batteries in smoke detectors. Self-testicular exams. Immunization recommendations discussed, Prevnar-13 given today, yearly high dose flu shots recommended. Colonoscopy recommendations reviewed--due again 2018.    Medicare Attestation I have personally reviewed: The patient's medical and social history Their use of alcohol, tobacco or illicit drugs Their current medications and supplements The patient's functional ability including ADLs,fall risks, home safety risks, cognitive, and hearing and visual impairment Diet and physical activities Evidence for depression or mood disorders  The patient's weight, height, BMI, and visual acuity have been recorded in the chart.  I have made referrals, counseling, and provided education to the patient based on review of the above and I have provided the patient with a written personalized care plan for preventive services.     Kenzleigh Sedam A, MD   01/22/2015

## 2015-01-23 LAB — HEPATITIS C ANTIBODY: HCV Ab: NEGATIVE

## 2015-01-31 DIAGNOSIS — Z23 Encounter for immunization: Secondary | ICD-10-CM | POA: Diagnosis not present

## 2015-02-19 ENCOUNTER — Other Ambulatory Visit: Payer: Self-pay | Admitting: Family Medicine

## 2015-03-12 ENCOUNTER — Other Ambulatory Visit: Payer: Self-pay | Admitting: Family Medicine

## 2015-03-15 ENCOUNTER — Other Ambulatory Visit: Payer: Self-pay | Admitting: Family Medicine

## 2015-03-17 DIAGNOSIS — H11153 Pinguecula, bilateral: Secondary | ICD-10-CM | POA: Diagnosis not present

## 2015-03-17 DIAGNOSIS — H43811 Vitreous degeneration, right eye: Secondary | ICD-10-CM | POA: Diagnosis not present

## 2015-03-17 DIAGNOSIS — H2513 Age-related nuclear cataract, bilateral: Secondary | ICD-10-CM | POA: Diagnosis not present

## 2015-03-17 DIAGNOSIS — E119 Type 2 diabetes mellitus without complications: Secondary | ICD-10-CM | POA: Diagnosis not present

## 2015-03-17 DIAGNOSIS — Z7984 Long term (current) use of oral hypoglycemic drugs: Secondary | ICD-10-CM | POA: Diagnosis not present

## 2015-03-17 LAB — HM DIABETES EYE EXAM

## 2015-04-28 ENCOUNTER — Ambulatory Visit: Payer: Medicare Other | Admitting: Gastroenterology

## 2015-04-29 DIAGNOSIS — Z7984 Long term (current) use of oral hypoglycemic drugs: Secondary | ICD-10-CM | POA: Diagnosis not present

## 2015-04-29 DIAGNOSIS — E669 Obesity, unspecified: Secondary | ICD-10-CM | POA: Diagnosis not present

## 2015-04-29 DIAGNOSIS — E119 Type 2 diabetes mellitus without complications: Secondary | ICD-10-CM | POA: Diagnosis not present

## 2015-04-29 DIAGNOSIS — Z6830 Body mass index (BMI) 30.0-30.9, adult: Secondary | ICD-10-CM | POA: Diagnosis not present

## 2015-04-29 LAB — HEMOGLOBIN A1C: Hgb A1c MFr Bld: 6.1 % — AB (ref 4.0–6.0)

## 2015-05-22 ENCOUNTER — Ambulatory Visit (INDEPENDENT_AMBULATORY_CARE_PROVIDER_SITE_OTHER): Payer: Medicare Other | Admitting: Podiatry

## 2015-05-22 ENCOUNTER — Encounter: Payer: Self-pay | Admitting: Podiatry

## 2015-05-22 DIAGNOSIS — B351 Tinea unguium: Secondary | ICD-10-CM

## 2015-05-22 DIAGNOSIS — E119 Type 2 diabetes mellitus without complications: Secondary | ICD-10-CM

## 2015-05-22 DIAGNOSIS — M79676 Pain in unspecified toe(s): Secondary | ICD-10-CM

## 2015-05-22 NOTE — Progress Notes (Signed)
Patient ID: [name], male   DOB: 1946/06/04, 69 y.o.   MRN: [id number] Complaint:  Visit Type: Patient returns to my office for continued preventative foot care services. Complaint: Patient states" my nails have grown long and thick and become painful to walk and wear shoes" Patient has been diagnosed with DM with no foot complications. The patient presents for preventative foot care services. No changes to ROS  Podiatric Exam: Vascular: dorsalis pedis and posterior tibial pulses are palpable bilateral. Capillary return is immediate. Temperature gradient is WNL. Skin turgor WNL  Sensorium: Normal Semmes Weinstein monofilament test. Normal tactile sensation bilaterally. Nail Exam: Pt has thick disfigured discolored nails with subungual debris noted bilateral entire nail hallux through fifth toenails Ulcer Exam: There is no evidence of ulcer or pre-ulcerative changes or infection. Orthopedic Exam: Muscle tone and strength are WNL. No limitations in general ROM. No crepitus or effusions noted. Foot type and digits show no abnormalities. Bony prominences are unremarkable. Skin: No Porokeratosis. No infection or ulcers  Diagnosis:  Onychomycosis, , Pain in right toe, pain in left toes  Treatment & Plan Procedures and Treatment: Consent by patient was obtained for treatment procedures. The patient understood the discussion of treatment and procedures well. All questions were answered thoroughly reviewed. Debridement of mycotic and hypertrophic toenails, 1 through 5 bilateral and clearing of subungual debris. No ulceration, no infection noted.  Return Visit-Office Procedure: Patient instructed to return to the office for a follow up visit 4  months for continued evaluation and treatment.  Gardiner Barefoot DPM

## 2015-06-03 ENCOUNTER — Other Ambulatory Visit: Payer: Self-pay | Admitting: Family Medicine

## 2015-06-13 ENCOUNTER — Telehealth: Payer: Self-pay | Admitting: Family Medicine

## 2015-06-13 DIAGNOSIS — E7800 Pure hypercholesterolemia, unspecified: Secondary | ICD-10-CM

## 2015-06-13 NOTE — Telephone Encounter (Signed)
PHARMACY SENT REQUEST FOR CRESTOR 10 MG #90 TABLET TAKE ONE TABLET BY MOUTH ONCE DAILY

## 2015-06-13 NOTE — Telephone Encounter (Signed)
Per chart, this was last filled in October for #90 with 2 additional refills.  There should be refills on file.

## 2015-07-01 DIAGNOSIS — H6121 Impacted cerumen, right ear: Secondary | ICD-10-CM | POA: Diagnosis not present

## 2015-07-01 DIAGNOSIS — H9191 Unspecified hearing loss, right ear: Secondary | ICD-10-CM | POA: Diagnosis not present

## 2015-07-07 DIAGNOSIS — M7632 Iliotibial band syndrome, left leg: Secondary | ICD-10-CM | POA: Diagnosis not present

## 2015-07-09 DIAGNOSIS — M7632 Iliotibial band syndrome, left leg: Secondary | ICD-10-CM | POA: Diagnosis not present

## 2015-07-09 DIAGNOSIS — M25552 Pain in left hip: Secondary | ICD-10-CM | POA: Diagnosis not present

## 2015-07-14 DIAGNOSIS — M7632 Iliotibial band syndrome, left leg: Secondary | ICD-10-CM | POA: Diagnosis not present

## 2015-07-14 DIAGNOSIS — M25552 Pain in left hip: Secondary | ICD-10-CM | POA: Diagnosis not present

## 2015-07-18 DIAGNOSIS — M25552 Pain in left hip: Secondary | ICD-10-CM | POA: Diagnosis not present

## 2015-07-18 DIAGNOSIS — M7632 Iliotibial band syndrome, left leg: Secondary | ICD-10-CM | POA: Diagnosis not present

## 2015-07-21 DIAGNOSIS — M7632 Iliotibial band syndrome, left leg: Secondary | ICD-10-CM | POA: Diagnosis not present

## 2015-07-21 DIAGNOSIS — M25552 Pain in left hip: Secondary | ICD-10-CM | POA: Diagnosis not present

## 2015-07-23 DIAGNOSIS — M7632 Iliotibial band syndrome, left leg: Secondary | ICD-10-CM | POA: Diagnosis not present

## 2015-07-23 DIAGNOSIS — M25552 Pain in left hip: Secondary | ICD-10-CM | POA: Diagnosis not present

## 2015-07-28 DIAGNOSIS — M7632 Iliotibial band syndrome, left leg: Secondary | ICD-10-CM | POA: Diagnosis not present

## 2015-07-28 DIAGNOSIS — M25552 Pain in left hip: Secondary | ICD-10-CM | POA: Diagnosis not present

## 2015-07-30 DIAGNOSIS — Z7984 Long term (current) use of oral hypoglycemic drugs: Secondary | ICD-10-CM | POA: Diagnosis not present

## 2015-07-30 DIAGNOSIS — E119 Type 2 diabetes mellitus without complications: Secondary | ICD-10-CM | POA: Diagnosis not present

## 2015-07-30 DIAGNOSIS — Z794 Long term (current) use of insulin: Secondary | ICD-10-CM | POA: Diagnosis not present

## 2015-07-30 LAB — HEMOGLOBIN A1C: HEMOGLOBIN A1C: 7.8

## 2015-08-02 ENCOUNTER — Other Ambulatory Visit: Payer: Self-pay | Admitting: Family Medicine

## 2015-08-06 DIAGNOSIS — M25552 Pain in left hip: Secondary | ICD-10-CM | POA: Diagnosis not present

## 2015-08-06 DIAGNOSIS — M7632 Iliotibial band syndrome, left leg: Secondary | ICD-10-CM | POA: Diagnosis not present

## 2015-08-21 ENCOUNTER — Telehealth: Payer: Self-pay | Admitting: Family Medicine

## 2015-08-21 DIAGNOSIS — E7800 Pure hypercholesterolemia, unspecified: Secondary | ICD-10-CM

## 2015-08-21 DIAGNOSIS — Z5181 Encounter for therapeutic drug level monitoring: Secondary | ICD-10-CM

## 2015-08-21 DIAGNOSIS — Z125 Encounter for screening for malignant neoplasm of prostate: Secondary | ICD-10-CM

## 2015-08-21 DIAGNOSIS — I10 Essential (primary) hypertension: Secondary | ICD-10-CM

## 2015-08-21 DIAGNOSIS — Z794 Long term (current) use of insulin: Secondary | ICD-10-CM

## 2015-08-21 DIAGNOSIS — E119 Type 2 diabetes mellitus without complications: Secondary | ICD-10-CM

## 2015-08-21 NOTE — Telephone Encounter (Signed)
Orders entered.  Please make sure that he gets [place] copies of the labs that Dr. Buddy Duty does at his appointment (not sure if he had in February, or has this month).

## 2015-08-21 NOTE — Telephone Encounter (Signed)
Pt coming in 04/14/16 for CPE & wants to come in before for labs, is this ok?

## 2015-08-22 NOTE — Telephone Encounter (Signed)
Pt informed

## 2015-09-04 DIAGNOSIS — E119 Type 2 diabetes mellitus without complications: Secondary | ICD-10-CM | POA: Diagnosis not present

## 2015-09-04 DIAGNOSIS — Z794 Long term (current) use of insulin: Secondary | ICD-10-CM | POA: Diagnosis not present

## 2015-09-08 ENCOUNTER — Other Ambulatory Visit: Payer: Self-pay | Admitting: Family Medicine

## 2015-09-11 ENCOUNTER — Other Ambulatory Visit: Payer: Self-pay | Admitting: Family Medicine

## 2015-09-24 ENCOUNTER — Ambulatory Visit (INDEPENDENT_AMBULATORY_CARE_PROVIDER_SITE_OTHER): Payer: Medicare Other | Admitting: Podiatry

## 2015-09-24 ENCOUNTER — Encounter: Payer: Self-pay | Admitting: Podiatry

## 2015-09-24 DIAGNOSIS — B351 Tinea unguium: Secondary | ICD-10-CM | POA: Diagnosis not present

## 2015-09-24 DIAGNOSIS — E119 Type 2 diabetes mellitus without complications: Secondary | ICD-10-CM

## 2015-09-24 DIAGNOSIS — M79676 Pain in unspecified toe(s): Secondary | ICD-10-CM | POA: Diagnosis not present

## 2015-09-24 NOTE — Progress Notes (Signed)
Patient ID: [name], male   DOB: 09-17-1945, 70 y.o.   MRN: [id number] Complaint:  Visit Type: Patient returns to my office for continued preventative foot care services. Complaint: Patient states" my nails have grown long and thick and become painful to walk and wear shoes" Patient has been diagnosed with DM with no foot complications. The patient presents for preventative foot care services. No changes to ROS  Podiatric Exam: Vascular: dorsalis pedis and posterior tibial pulses are palpable bilateral. Capillary return is immediate. Temperature gradient is WNL. Skin turgor WNL  Sensorium: Normal Semmes Weinstein monofilament test. Normal tactile sensation bilaterally. Nail Exam: Pt has thick disfigured discolored nails with subungual debris noted bilateral entire nail hallux through fifth toenails Ulcer Exam: There is no evidence of ulcer or pre-ulcerative changes or infection. Orthopedic Exam: Muscle tone and strength are WNL. No limitations in general ROM. No crepitus or effusions noted. Foot type and digits show no abnormalities. Bony prominences are unremarkable. Skin: No Porokeratosis. No infection or ulcers  Diagnosis:  Onychomycosis, , Pain in right toe, pain in left toes  Treatment & Plan Procedures and Treatment: Consent by patient was obtained for treatment procedures. The patient understood the discussion of treatment and procedures well. All questions were answered thoroughly reviewed. Debridement of mycotic and hypertrophic toenails, 1 through 5 bilateral and clearing of subungual debris. No ulceration, no infection noted.  Return Visit-Office Procedure: Patient instructed to return to the office for a follow up visit 4  months for continued evaluation and treatment.  Gardiner Barefoot DPM

## 2015-10-03 DIAGNOSIS — H1045 Other chronic allergic conjunctivitis: Secondary | ICD-10-CM | POA: Diagnosis not present

## 2015-10-06 DIAGNOSIS — H16201 Unspecified keratoconjunctivitis, right eye: Secondary | ICD-10-CM | POA: Diagnosis not present

## 2015-10-08 DIAGNOSIS — E1165 Type 2 diabetes mellitus with hyperglycemia: Secondary | ICD-10-CM | POA: Diagnosis not present

## 2015-10-08 DIAGNOSIS — Z794 Long term (current) use of insulin: Secondary | ICD-10-CM | POA: Diagnosis not present

## 2015-10-08 DIAGNOSIS — Z6830 Body mass index (BMI) 30.0-30.9, adult: Secondary | ICD-10-CM | POA: Diagnosis not present

## 2015-10-08 DIAGNOSIS — E669 Obesity, unspecified: Secondary | ICD-10-CM | POA: Diagnosis not present

## 2015-10-10 ENCOUNTER — Encounter: Payer: Self-pay | Admitting: Family Medicine

## 2015-10-10 ENCOUNTER — Ambulatory Visit (INDEPENDENT_AMBULATORY_CARE_PROVIDER_SITE_OTHER): Payer: Medicare Other | Admitting: Family Medicine

## 2015-10-10 VITALS — BP 128/70 | HR 72 | Temp 98.7°F | Ht 68.5 in | Wt 205.4 lb

## 2015-10-10 DIAGNOSIS — R05 Cough: Secondary | ICD-10-CM

## 2015-10-10 DIAGNOSIS — J309 Allergic rhinitis, unspecified: Secondary | ICD-10-CM | POA: Diagnosis not present

## 2015-10-10 DIAGNOSIS — R059 Cough, unspecified: Secondary | ICD-10-CM

## 2015-10-10 MED ORDER — BENZONATATE 200 MG PO CAPS: 200.0000 mg | ORAL_CAPSULE | Freq: Three times a day (TID) | ORAL | Status: DC | PRN

## 2015-10-10 NOTE — Progress Notes (Signed)
Chief Complaint  Patient presents with  . Cough    started with sinus issues about 3 weeks ago, thinks related to allergies from pollen, etc. Progressed into cough and has worsened, mucus is clear and reports no fevers.    Nonproductive cough x 3 weeks.  Feels like a tickle in his throat, somewhat worse at night (wakes him up). He thinks it could be related to allergies flaring, as post-nasal drainage. He has been fatigued. He reports it feels similar to prior h/o asthma, but not as bad.  Denies shortness of breath.  Wife sometimes hears a slight wheeze. He has also been having problems with his eyes related to allergies (improved, s/p treatment by eye doctor).    He has been using Flonase regularly.  Has been on zyrtec x 6 weeks but didn't notice much improvement. Hasn't tried any cough medications, no mucinex.  Denies heartburn.  No significant changes in sugars.  PMH, PSH, SH reviewed. No changes to [place]  Outpatient Encounter Prescriptions as of 10/10/2015  Medication Sig Note  . ACCU-CHEK FASTCLIX LANCETS MISC use three times a day WHEN CHECKING BLOOD SUGAR 10/10/2015: Received from: External Pharmacy  . ACCU-CHEK SMARTVIEW test strip 3 (three) times daily. for testing 10/10/2015: Received from: External Pharmacy  . amLODipine (NORVASC) 5 MG tablet take 1 tablet by mouth once daily   . aspirin 81 MG tablet Take 81 mg by mouth daily.     . B-D INS SYRINGE 0.5CC/31GX5/16 31G X 5/16" 0.5 ML MISC  12/24/2013: Received from: External Pharmacy  . BD INSULIN SYRINGE ULTRAFINE 31G X 15/64" 1 ML MISC  10/16/2014: Received from: External Pharmacy  . fenofibrate (TRICOR) 145 MG tablet take 1 tablet by mouth once daily   . losartan-hydrochlorothiazide (HYZAAR) 100-12.5 MG tablet take 1 tablet by mouth once daily   . metFORMIN (GLUCOPHAGE-XR) 500 MG 24 hr tablet Take 1,000 mg by mouth 2 (two) times daily.   Marland Kitchen NOVOLIN 70/30 RELION (70-30) 100 UNIT/ML injection Inject 40 Units into the skin daily with breakfast.  And 60 units with dinner 10/16/2014: Received from: External Pharmacy  . rosuvastatin (CRESTOR) 20 MG tablet TAKE 1/2 TABLET BY MOUTH DAILY   . VIAGRA 100 MG tablet TAKE 1/2 -1 TABLET BY MOUTH AS NEEDED FOR ERECTILE DYSFUCTION   . [DISCONTINUED] CRESTOR 10 MG tablet take 1 tablet by mouth once daily   . JARDIANCE 10 MG TABS tablet Reported on 10/10/2015 10/16/2014: Received from: External Pharmacy  . [DISCONTINUED] CRESTOR 10 MG tablet take 1 tablet by mouth once daily   . [DISCONTINUED] fenofibrate (TRICOR) 145 MG tablet take 1 tablet by mouth once daily   . [DISCONTINUED] FIBER PO Take 2 each by mouth daily.   . [DISCONTINUED] Loperamide HCl (IMODIUM A-D PO) Take 1 tablet by mouth as needed. Reported on 10/10/2015 10/16/2014: Uses prn, not often   No facility-administered encounter medications on file as of 10/10/2015.   No Known Allergies  ROS:  Denies fevers, chills, headaches, dizziness, chest pain, shortness of breath.  +allergies, cough as per HPI.  No bleeding, bruising, rashes, no heartburn. +fatigue.  PHYSICAL EXAM:  BP 128/70 mmHg  Pulse 72  Temp(Src) 98.7 F (37.1 C) (Tympanic)  Ht 5' 8.5" (1.74 m)  Wt 205 lb 6.4 oz (93.169 kg)  BMI 30.77 kg/m2  Well appearing male in no distress.  Occasional dry cough, and sniffle HEENT: PERRL, EOMI, conjunctiva and sclera are clear Nasal mucosa is mildly edematous, pale, with some clear mucus.  L>R  edema OP is clear Lungs: clear bilaterally, no wheezes, rales, ronchi, good air movement Heart: regular rate and rhythm without murmur Neuro: alert and oriented, cranial nerves intact, normal strength, gait Psych: normal mood, affect, hygiene and grooming  ASSESSMENT/PLAN:  Cough - Plan: benzonatate (TESSALON) 200 MG capsule  Allergic rhinitis, unspecified allergic rhinitis type   Cough--suspect that it is related to postnasal drainage from allergies. Continue the Flonase and zyrtec daily. Add Mucinex (up to 1200mg  twice daily of the 12  hour version). Delsym syrup is a 12 hour cough suppressant (same ingredient as what would be found in Mucinex DM--don't use both together, I recommend getting them separately, plain Mucinex, not the DM). You can use the Delsym if needed for cough (ie at bedtime). I'm prescribing another cough medication called tessalon.  Use this if you find the delsym and mucinex not to be effective for the cough.  I think continuing with the mucinex is a good idea, even if you don't get complete improvement with the cough (as it works by keeping the phlegm/mucus thin).   Call us if you develop fever, discolored mucus, and seek care if worsening shortness of breath, pain with breathing, fever or other concerns develop.

## 2015-10-10 NOTE — Patient Instructions (Signed)
  Cough--suspect that it is related to postnasal drainage from allergies. Continue the Flonase and zyrtec daily. Add Mucinex (up to 1200mg  twice daily of the 12 hour version). Delsym syrup is a 12 hour cough suppressant (same ingredient as what would be found in Mucinex DM--don't use both together, I recommend getting them separately, plain Mucinex, not the DM). You can use the Delsym if needed for cough (ie at bedtime). I'm prescribing another cough medication called tessalon.  Use this if you find the delsym and mucinex not to be effective for the cough.  I think continuing with the mucinex is a good idea, even if you don't get complete improvement with the cough (as it works by keeping the phlegm/mucus thin).   Call us if you develop fever, discolored mucus, and seek care if worsening shortness of breath, pain with breathing, fever or other concerns develop.

## 2015-11-13 ENCOUNTER — Encounter: Payer: Self-pay | Admitting: Podiatry

## 2015-11-13 ENCOUNTER — Ambulatory Visit (INDEPENDENT_AMBULATORY_CARE_PROVIDER_SITE_OTHER): Payer: Medicare Other | Admitting: Podiatry

## 2015-11-13 DIAGNOSIS — M79676 Pain in unspecified toe(s): Secondary | ICD-10-CM | POA: Diagnosis not present

## 2015-11-13 DIAGNOSIS — E119 Type 2 diabetes mellitus without complications: Secondary | ICD-10-CM | POA: Diagnosis not present

## 2015-11-13 DIAGNOSIS — B351 Tinea unguium: Secondary | ICD-10-CM | POA: Diagnosis not present

## 2015-11-13 NOTE — Progress Notes (Signed)
Patient ID: [name], male   DOB: [birth year], 70 y.o.   MRN: [id number] Complaint:  Visit Type: Patient returns to my office for continued preventative foot care services. Complaint: Patient states" my nails have grown long and thick and become painful to walk and wear shoes" Patient has been diagnosed with DM with no foot complications. The patient presents for preventative foot care services. No changes to ROS  Podiatric Exam: Vascular: dorsalis pedis and posterior tibial pulses are palpable bilateral. Capillary return is immediate. Temperature gradient is WNL. Skin turgor WNL  Sensorium: Normal Semmes Weinstein monofilament test. Normal tactile sensation bilaterally. Nail Exam: Pt has thick disfigured discolored nails with subungual debris noted bilateral entire nail hallux through fifth toenails Ulcer Exam: There is no evidence of ulcer or pre-ulcerative changes or infection. Orthopedic Exam: Muscle tone and strength are WNL. No limitations in general ROM. No crepitus or effusions noted. Foot type and digits show no abnormalities. Bony prominences are unremarkable. Skin: No Porokeratosis. No infection or ulcers  Diagnosis:  Onychomycosis, , Pain in right toe, pain in left toes  Treatment & Plan Procedures and Treatment: Consent by patient was obtained for treatment procedures. The patient understood the discussion of treatment and procedures well. All questions were answered thoroughly reviewed. Debridement of mycotic and hypertrophic toenails, 1 through 5 bilateral and clearing of subungual debris. No ulceration, no infection noted.  Return Visit-Office Procedure: Patient instructed to return to the office for a follow up visit 4  months for continued evaluation and treatment.  Gardiner Barefoot DPM

## 2015-11-27 ENCOUNTER — Other Ambulatory Visit: Payer: Self-pay | Admitting: Family Medicine

## 2015-12-10 ENCOUNTER — Other Ambulatory Visit: Payer: Self-pay | Admitting: Family Medicine

## 2015-12-18 ENCOUNTER — Ambulatory Visit: Payer: Medicare Other | Admitting: Podiatry

## 2015-12-24 ENCOUNTER — Ambulatory Visit: Payer: Medicare Other | Admitting: Podiatry

## 2016-01-01 ENCOUNTER — Ambulatory Visit: Payer: Medicare Other | Admitting: Podiatry

## 2016-01-09 DIAGNOSIS — Z794 Long term (current) use of insulin: Secondary | ICD-10-CM | POA: Diagnosis not present

## 2016-01-09 DIAGNOSIS — E669 Obesity, unspecified: Secondary | ICD-10-CM | POA: Diagnosis not present

## 2016-01-09 DIAGNOSIS — E1165 Type 2 diabetes mellitus with hyperglycemia: Secondary | ICD-10-CM | POA: Diagnosis not present

## 2016-01-09 LAB — HM DIABETES FOOT EXAM: HM DIABETIC FOOT EXAM: NORMAL

## 2016-01-09 LAB — HEMOGLOBIN A1C: HEMOGLOBIN A1C: 8.4

## 2016-01-21 ENCOUNTER — Encounter: Payer: Self-pay | Admitting: Family Medicine

## 2016-01-22 ENCOUNTER — Ambulatory Visit: Payer: Medicare Other | Admitting: Podiatry

## 2016-01-28 ENCOUNTER — Ambulatory Visit (INDEPENDENT_AMBULATORY_CARE_PROVIDER_SITE_OTHER): Payer: Medicare Other | Admitting: Podiatry

## 2016-01-28 ENCOUNTER — Encounter: Payer: Self-pay | Admitting: Podiatry

## 2016-01-28 DIAGNOSIS — B351 Tinea unguium: Secondary | ICD-10-CM

## 2016-01-28 DIAGNOSIS — E119 Type 2 diabetes mellitus without complications: Secondary | ICD-10-CM

## 2016-01-28 DIAGNOSIS — M79676 Pain in unspecified toe(s): Secondary | ICD-10-CM

## 2016-01-28 NOTE — Progress Notes (Signed)
Patient ID: [name], male   DOB: 07/15/1945, 70 y.o.   MRN: [id number] Complaint:  Visit Type: Patient returns to my office for continued preventative foot care services. Complaint: Patient states" my nails have grown long and thick and become painful to walk and wear shoes" Patient has been diagnosed with DM with no foot complications. The patient presents for preventative foot care services. No changes to ROS  Podiatric Exam: Vascular: dorsalis pedis and posterior tibial pulses are palpable bilateral. Capillary return is immediate. Temperature gradient is WNL. Skin turgor WNL  Sensorium: Normal Semmes Weinstein monofilament test. Normal tactile sensation bilaterally. Nail Exam: Pt has thick disfigured discolored nails with subungual debris noted bilateral entire nail hallux through fifth toenails Ulcer Exam: There is no evidence of ulcer or pre-ulcerative changes or infection. Orthopedic Exam: Muscle tone and strength are WNL. No limitations in general ROM. No crepitus or effusions noted. Foot type and digits show no abnormalities. Bony prominences are unremarkable. Skin: No Porokeratosis. No infection or ulcers  Diagnosis:  Onychomycosis, , Pain in right toe, pain in left toes  Treatment & Plan Procedures and Treatment: Consent by patient was obtained for treatment procedures. The patient understood the discussion of treatment and procedures well. All questions were answered thoroughly reviewed. Debridement of mycotic and hypertrophic toenails, 1 through 5 bilateral and clearing of subungual debris. No ulceration, no infection noted.  Return Visit-Office Procedure: Patient instructed to return to the office for a follow up visit 9 weeks  for continued evaluation and treatment.  Allene Furuya DPM 

## 2016-02-03 ENCOUNTER — Other Ambulatory Visit: Payer: Self-pay | Admitting: Family Medicine

## 2016-02-03 NOTE — Telephone Encounter (Signed)
Is this ok to refill?  

## 2016-02-17 ENCOUNTER — Other Ambulatory Visit: Payer: Self-pay | Admitting: Family Medicine

## 2016-03-08 ENCOUNTER — Other Ambulatory Visit: Payer: Self-pay | Admitting: Family Medicine

## 2016-03-12 DIAGNOSIS — Z23 Encounter for immunization: Secondary | ICD-10-CM | POA: Diagnosis not present

## 2016-03-31 ENCOUNTER — Ambulatory Visit (INDEPENDENT_AMBULATORY_CARE_PROVIDER_SITE_OTHER): Payer: Medicare Other | Admitting: Podiatry

## 2016-03-31 ENCOUNTER — Encounter: Payer: Self-pay | Admitting: Podiatry

## 2016-03-31 VITALS — Ht 68.5 in | Wt 200.0 lb

## 2016-03-31 DIAGNOSIS — E119 Type 2 diabetes mellitus without complications: Secondary | ICD-10-CM | POA: Diagnosis not present

## 2016-03-31 DIAGNOSIS — M79676 Pain in unspecified toe(s): Secondary | ICD-10-CM | POA: Diagnosis not present

## 2016-03-31 DIAGNOSIS — B351 Tinea unguium: Secondary | ICD-10-CM

## 2016-03-31 NOTE — Progress Notes (Signed)
Patient ID: [name], male   DOB: 07/04/1945, 70 y.o.   MRN: [id number] Complaint:  Visit Type: Patient returns to my office for continued preventative foot care services. Complaint: Patient states" my nails have grown long and thick and become painful to walk and wear shoes" Patient has been diagnosed with DM with no foot complications. The patient presents for preventative foot care services. No changes to ROS  Podiatric Exam: Vascular: dorsalis pedis and posterior tibial pulses are palpable bilateral. Capillary return is immediate. Temperature gradient is WNL. Skin turgor WNL  Sensorium: Normal Semmes Weinstein monofilament test. Normal tactile sensation bilaterally. Nail Exam: Pt has thick disfigured discolored nails with subungual debris noted bilateral entire nail hallux through fifth toenails Ulcer Exam: There is no evidence of ulcer or pre-ulcerative changes or infection. Orthopedic Exam: Muscle tone and strength are WNL. No limitations in general ROM. No crepitus or effusions noted. Foot type and digits show no abnormalities. Bony prominences are unremarkable. Skin: No Porokeratosis. No infection or ulcers  Diagnosis:  Onychomycosis, , Pain in right toe, pain in left toes  Treatment & Plan Procedures and Treatment: Consent by patient was obtained for treatment procedures. The patient understood the discussion of treatment and procedures well. All questions were answered thoroughly reviewed. Debridement of mycotic and hypertrophic toenails, 1 through 5 bilateral and clearing of subungual debris. No ulceration, no infection noted.  Return Visit-Office Procedure: Patient instructed to return to the office for a follow up visit 9 weeks  for continued evaluation and treatment.  Imagene Boss DPM 

## 2016-04-12 ENCOUNTER — Other Ambulatory Visit: Payer: Medicare Other

## 2016-04-12 DIAGNOSIS — E119 Type 2 diabetes mellitus without complications: Secondary | ICD-10-CM

## 2016-04-12 DIAGNOSIS — Z5181 Encounter for therapeutic drug level monitoring: Secondary | ICD-10-CM

## 2016-04-12 DIAGNOSIS — Z125 Encounter for screening for malignant neoplasm of prostate: Secondary | ICD-10-CM | POA: Diagnosis not present

## 2016-04-12 DIAGNOSIS — E7800 Pure hypercholesterolemia, unspecified: Secondary | ICD-10-CM | POA: Diagnosis not present

## 2016-04-12 DIAGNOSIS — Z794 Long term (current) use of insulin: Secondary | ICD-10-CM | POA: Diagnosis not present

## 2016-04-13 ENCOUNTER — Encounter: Payer: Self-pay | Admitting: Gastroenterology

## 2016-04-13 LAB — CBC WITH DIFFERENTIAL/PLATELET
BASOS ABS: 0 {cells}/uL (ref 0–200)
Basophils Relative: 0 %
EOS ABS: 86 {cells}/uL (ref 15–500)
Eosinophils Relative: 2 %
HCT: 42 % (ref 38.5–50.0)
Hemoglobin: 13.9 g/dL (ref 13.2–17.1)
LYMPHS PCT: 31 %
Lymphs Abs: 1333 cells/uL (ref 850–3900)
MCH: 33.2 pg — AB (ref 27.0–33.0)
MCHC: 33.1 g/dL (ref 32.0–36.0)
MCV: 100.2 fL — AB (ref 80.0–100.0)
MONOS PCT: 10 %
MPV: 10.6 fL (ref 7.5–12.5)
Monocytes Absolute: 430 cells/uL (ref 200–950)
Neutro Abs: 2451 cells/uL (ref 1500–7800)
Neutrophils Relative %: 57 %
PLATELETS: 226 10*3/uL (ref 140–400)
RBC: 4.19 MIL/uL — ABNORMAL LOW (ref 4.20–5.80)
RDW: 14 % (ref 11.0–15.0)
WBC: 4.3 10*3/uL (ref 4.0–10.5)

## 2016-04-13 LAB — COMPREHENSIVE METABOLIC PANEL
ALBUMIN: 4.5 g/dL (ref 3.6–5.1)
ALT: 24 U/L (ref 9–46)
AST: 19 U/L (ref 10–35)
Alkaline Phosphatase: 27 U/L — ABNORMAL LOW (ref 40–115)
BILIRUBIN TOTAL: 0.9 mg/dL (ref 0.2–1.2)
BUN: 19 mg/dL (ref 7–25)
CO2: 26 mmol/L (ref 20–31)
CREATININE: 1.17 mg/dL (ref 0.70–1.25)
Calcium: 10 mg/dL (ref 8.6–10.3)
Chloride: 102 mmol/L (ref 98–110)
Glucose, Bld: 187 mg/dL — ABNORMAL HIGH (ref 65–99)
Potassium: 4.5 mmol/L (ref 3.5–5.3)
SODIUM: 136 mmol/L (ref 135–146)
TOTAL PROTEIN: 6.5 g/dL (ref 6.1–8.1)

## 2016-04-13 LAB — LIPID PANEL
Cholesterol: 127 mg/dL (ref <200)
HDL: 28 mg/dL — ABNORMAL LOW (ref >40)
LDL Cholesterol: 67 mg/dL
Total CHOL/HDL Ratio: 4.5 Ratio (ref <5.0)
Triglycerides: 160 mg/dL — ABNORMAL HIGH (ref <150)
VLDL: 32 mg/dL — ABNORMAL HIGH (ref <30)

## 2016-04-13 LAB — TSH: TSH: 0.98 m[IU]/L (ref 0.40–4.50)

## 2016-04-13 LAB — PSA: PSA: 3.5 ng/mL (ref <=4.0)

## 2016-04-13 NOTE — Progress Notes (Signed)
Chief Complaint  Patient presents with  . Medicare Wellness    nonfasting (labs already done) AWV, med check +. Still has occasionnal diarrhea off and on and takes immodium from time to time. Due for colonoscopy in January and will schedule it.     [name] is a 70 y.o. male who presents for annual wellness visit and follow-up on chronic medical conditions.   Intermittent problems with bloating, abdominal pain and diarrhea. He has had this intermittently for years. Once was treated with presumptive course of flagyl, and improved some.  Now more intermittent. Had a bad bout 6 weeks ago for a couple of weeks. He feels like this might be IBS.  No difference if he takes probiotics or avoids dairy. Denies blood or mucus in the stool.  He is due for colonoscopy in January (just got letter in the mail yesterday to schedule).  Currently feels fine.  Hypertension follow-up: Blood pressures at the pharmacy (checked occasionally) are usually 120-130/70-80. Denies dizziness, headaches, chest pain. Denies side effects of medications.   Hyperlipidemia follow-up: Patient is reportedly following a low-fat, low cholesterol diet. Red meat once a week, 1 fried egg daily. Compliant with taking his Crestor and Tricor, and denies medication side effects. He was concerned about the cost of the Crestor, so last year dose was doubled for him to take 1/2 tablet (to help with cost).  ED: 1/2 tablet of Viagra is usually effective.  OSA: Uses CPAP machine nightly. He feels better when using it. Denies any problems. Feels refreshed when he wakes up.  DM: managed by Dr. Buddy Duty. His last A1c was 8.4 in 01/2016.  He reports this was after being off the [place] for about 6 months (due to cost).  His dose of insulin was subsequently titrated up.  Sugars running 120-140 in the mornings. He has another appointment with him on Friday.   Denies hypoglycemia. Denies polydipsia, polyuria.Goes to the eye doctor yearly (due  around December/January, waiting to get notice).  Gets routine care at podiatrist.   Immunization History  Administered Date(s) Administered  . Influenza Split 04/19/2011, 03/01/2012  . Influenza-Unspecified 04/07/2014, 02/04/2015  . Pneumococcal Conjugate-13 01/22/2015  . Pneumococcal Polysaccharide-23 06/07/2004, 03/01/2012  . Td 06/07/2002  . Tdap 03/01/2012  . Zoster 04/18/2008  got flu shot at [facility] last month Last colonoscopy: 07/07/11, due again in January.  Got letter yesterday to schedule Last PSA: with recent bloodwork Dentist: once yearly  Ophtho: once yearly, due soon. Exercise: walks 20 minutes 3-4 times/week, and golfing (cart).Has a Bowflex machine--does 14 minutes workout 5-6 days/week.(cardio--plans to use throughout the winter). Uses some handweights at home.  Other doctors caring for patient include: Dr. Buddy Duty for diabetes.  Dr. [place] for ophtho  Dr. Miguel Dibble --she retired and plans to see her replacement (Dr. Wilfrid Lund) Dr. Gayla Doss  Dr. Lupton--dermatologist Dr. Mayer--podiatrist  Depression, Fall and ADL screen were all completely unremarkable, see Epic questionnaires.  End of Life Discussion:  Patient has a living will and medical power of attorney. He updated these last year; needs to get [place] copies.  Past Medical History:  Diagnosis Date  . Adhesive capsulitis of right shoulder    s/p surgery  . Allergy   . Colon polyp 07/07/11   adenomatous and hyperplastic  . Diabetes mellitus   . Erectile dysfunction   . History of shingles mid 90's  . Hyperlipidemia elevated chol/TG  . Hypertension   . Kidney stones 8/07 [facility]  . Sleep apnea on CPAP  .  Torn muscle 12/2013   ruptured gluteus medius (Dr. Mardelle Matte)    Past Surgical History:  Procedure Laterality Date  . ROTATOR CUFF REPAIR  right 07/2010  . TONSILLECTOMY AND ADENOIDECTOMY  as a child    Social History   Social History  . Marital status: Married     Spouse name: N/A  . Number of children: 3  . Years of education: N/A   Occupational History  . Retired    Social History Main Topics  . Smoking status: Former Smoker    Quit date: 10/01/1986  . Smokeless tobacco: Never Used  . Alcohol use Yes     Comment: daily, 2 drinks, 1 scotch and 1 glass wine (8 ounce)  . Drug use: No  . Sexual activity: Yes    Partners: Female   Other Topics Concern  . Not on file   Social History Narrative   Lives with wife, 1 dog.  2 children in [place], 1 in [place], 6 grandchildren.  Retired from Radiation protection practitioner.  Sees grandkids most days.    Family History  Problem Relation Age of Onset  . Diabetes Mother   . Hyperlipidemia Mother   . Hypertension Mother   . Alzheimer's disease Mother   . Diabetes Father   . Hypertension Father   . Hyperlipidemia Brother   . Hypertension Maternal Grandmother   . Stroke Maternal Grandmother   . Heart attack Maternal Grandfather   . Prostate cancer Paternal Grandfather   . Hypertension Brother   . Colon cancer Neg Hx   . Stomach cancer Neg Hx     Outpatient Encounter Prescriptions as of 04/14/2016  Medication Sig Note  . ACCU-CHEK FASTCLIX LANCETS MISC use three times a day WHEN CHECKING BLOOD SUGAR 10/10/2015: Received from: External Pharmacy  . ACCU-CHEK SMARTVIEW test strip 3 (three) times daily. for testing 10/10/2015: Received from: External Pharmacy  . amLODipine (NORVASC) 5 MG tablet take 1 tablet by mouth once daily   . aspirin 81 MG tablet Take 81 mg by mouth daily.     . B-D INS SYRINGE 0.5CC/31GX5/16 31G X 5/16" 0.5 ML MISC  12/24/2013: Received from: External Pharmacy  . BD INSULIN SYRINGE ULTRAFINE 31G X 15/64" 1 ML MISC  10/16/2014: Received from: External Pharmacy  . fenofibrate (TRICOR) 145 MG tablet take 1 tablet by mouth once daily   . losartan-hydrochlorothiazide (HYZAAR) 100-12.5 MG tablet take 1 tablet by mouth once daily   . metFORMIN (GLUCOPHAGE-XR) 500 MG 24 hr tablet Take 1,000 mg by mouth 2 (two)  times daily.   Marland Kitchen NOVOLIN 70/30 RELION (70-30) 100 UNIT/ML injection Inject 50 Units into the skin daily with breakfast. And 70 units with dinner 10/16/2014: Received from: External Pharmacy  . rosuvastatin (CRESTOR) 20 MG tablet TAKE 1/2 TABLET BY MOUTH DAILY   . VIAGRA 100 MG tablet TAKE 1/2 -1 TABLET BY MOUTH AS NEEDED FOR ERECTILE DYSFUCTION   . [DISCONTINUED] benzonatate (TESSALON) 200 MG capsule Take 1 capsule (200 mg total) by mouth 3 (three) times daily as needed.   . [DISCONTINUED] fenofibrate (TRICOR) 145 MG tablet TAKE 1 TABLET BY MOUTH ONCE DAILY   . [DISCONTINUED] JARDIANCE 10 MG TABS tablet Reported on 10/10/2015 10/16/2014: Received from: External Pharmacy   No facility-administered encounter medications on file as of 04/14/2016.     No Known Allergies  ROS: The patient denies anorexia, fever, headaches, vision loss, decreased hearing, ear pain, hoarseness, chest pain, palpitations, dizziness, syncope, dyspnea on exertion, cough, swelling, nausea, vomiting, constipation, melena, hematochezia,  indigestion/heartburn, hematuria, incontinence, nocturia, weakened urine stream, dysuria, genital lesions, joint pains, numbness, tingling, weakness, tremor, suspicious skin lesions, depression, anxiety, abnormal bleeding/bruising, or enlarged lymph nodes. +erectile dysfunction  Occasional hesitancy.  Some frequency at 4-6pm, but sleeps through the night. Intermittent bloating and diarrhea (improved; worse prior to taking course of flagyl in the past). Seems to come and go--was worse 6 weeks ago (abdominal pain x 2-3 weeks).  Uses imodium prn. 3.6# weight gain since his last visit  PHYSICAL EXAM:  BP 130/80 (BP Location: Left Arm, Patient Position: Sitting, Cuff Size: Normal)   Pulse 68   Ht 5\' 9"  (1.753 m)   Wt 209 lb (94.8 kg)   BMI 30.86 kg/m   General Appearance:  Alert, cooperative, no distress, appears stated age   Head:  Normocephalic, without obvious abnormality, atraumatic    Eyes:  PERRL, conjunctiva/corneas clear, EOM's intact, fundi  benign   Ears:  Normal TM's and external ear canals   Nose:  Nares normal, mucosa normal, no drainage or sinus tenderness   Throat:  Lips, mucosa, and tongue normal; teeth and gums normal   Neck:  Supple, no lymphadenopathy; thyroid: no enlargement/tenderness/nodules; no carotid  bruit or JVD   Back:  Spine nontender, no curvature, ROM normal, no CVA tenderness   Lungs:  Clear to auscultation bilaterally without wheezes, rales or ronchi; respirations unlabored   Chest Wall:  No tenderness or deformity   Heart:  Regular rate and rhythm, S1 and S2 normal, no murmur, rub  or gallop   Breast Exam:  No chest wall tenderness, masses or gynecomastia   Abdomen:  Soft, non-tender, nondistended, normoactive bowel sounds,  no masses, no hepatosplenomegaly   Genitalia:  Normal male external genitalia without lesions. Testicles without masses. No inguinal hernias.   Rectal:  Normal sphincter tone, no masses or tenderness; guaiac negative stool. Small noninflamed external hemorrhoidal tag. Prostate smooth, no nodules, mild-mod enlarged, R>L. No nodule/mass.   Extremities:  No clubbing, cyanosis or edema   Pulses:  2+ and symmetric all extremities   Skin:  Skin color, texture, turgor normal, no rashes. Dry, hyperkeratotic patches on bilateral LE's. There is a 10mm flesh colored nodule on right foot, medial heel area, unchanged  Lymph nodes:  Cervical, supraclavicular, and axillary nodes normal   Neurologic:  CNII-XII intact, normal strength, sensation and gait; reflexes 2+ and symmetric throughout    Psych: Normal mood, affect, hygiene and grooming.       Diabetic foot exam was performed: Thickened/discolored R toenails (1, 2, 3, 5 on the right, discolored at 1 and 5 on the left. Remainder of foot exam is normal. There is 17mm flesh colored nodule on right foot, medial heel  area, unchanged from last year.  Lab Results  Component Value Date   PSA 3.5 04/12/2016   PSA 1.20 01/20/2015   PSA 1.06 11/01/2013   Lab Results  Component Value Date   WBC 4.3 04/12/2016   HGB 13.9 04/12/2016   HCT 42.0 04/12/2016   MCV 100.2 (H) 04/12/2016   PLT 226 04/12/2016     Chemistry      Component Value Date/Time   NA 136 04/12/2016 0716   K 4.5 04/12/2016 0716   CL 102 04/12/2016 0716   CO2 26 04/12/2016 0716   BUN 19 04/12/2016 0716   CREATININE 1.17 04/12/2016 0716      Component Value Date/Time   CALCIUM 10.0 04/12/2016 0716   ALKPHOS 27 (L) 04/12/2016 0716   AST 19  04/12/2016 0716   ALT 24 04/12/2016 0716   BILITOT 0.9 04/12/2016 0716     Fasting glucose 187  Lab Results  Component Value Date   CHOL 127 04/12/2016   HDL 28 (L) 04/12/2016   LDLCALC 67 04/12/2016   TRIG 160 (H) 04/12/2016   CHOLHDL 4.5 04/12/2016   Lab Results  Component Value Date   TSH 0.98 04/12/2016     ASSESSMENT/PLAN:  Medicare annual wellness visit, subsequent  Type 2 diabetes mellitus without complication, with long-term current use of insulin ([place]) - f/u with Dr. Buddy Duty as scheduled, will forward lab results. Elevating fglu on recent labs, okay per pt report  OSA on CPAP  Essential hypertension, benign - controlled; weight loss encouraged. Increase exercise to 121min/wk  Pure hypercholesterolemia - elevated chol/HDL ratio, very low HDL, borderline high TG; encouraged lowfat diet, increased exercise. continue current meds  Increased prostate specific antigen (PSA) velocity - prostate enlarged R>L on exam--no e/o prostatitis/infection or nodule. Repeat PSA 3 mos; refer to urology if persistently elevated/increasing - Plan: PSA  Macrocytosis--cut back on alcohol (smaller glass of wine, just 1 shot hard alcohol in drink)  Elevated PSA velocity--recheck 3 mos (nonfasting lab visit).  Med check 6 months-will call the week before for lab orders (so not released in  error in 3 months with the PSA)  Recheck lipids in 6 mos rather than yearly  SEND RESULTS TO DR [place]  Recommended at least 30 minutes of aerobic activity at least 5 days/week, weight-bearing exercise 2x/week; proper sunscreen use reviewed; healthy diet and alcohol recommendations (less than or equal to 2 drinks/day) reviewed; regular seatbelt use; changing batteries in smoke detectors. Self-testicular exams. Immunization recommendations discussed, UTD; yearly high dose flu shots recommended. Colonoscopy recommendations reviewed--due again 06/2016.   Medicare Attestation I have personally reviewed: The patient's medical and social history Their use of alcohol, tobacco or illicit drugs Their current medications and supplements The patient's functional ability including ADLs,fall risks, home safety risks, cognitive, and hearing and visual impairment Diet and physical activities Evidence for depression or mood disorders  The patient's weight, height, and BMI have been recorded in the chart.  I have made referrals, counseling, and provided education to the patient based on review of the above and I have provided the patient with a written personalized care plan for preventive services.     Ivee Poellnitz A, MD   04/13/2016

## 2016-04-14 ENCOUNTER — Ambulatory Visit (INDEPENDENT_AMBULATORY_CARE_PROVIDER_SITE_OTHER): Payer: Medicare Other | Admitting: Family Medicine

## 2016-04-14 ENCOUNTER — Encounter: Payer: Self-pay | Admitting: Family Medicine

## 2016-04-14 VITALS — BP 130/80 | HR 68 | Ht 69.0 in | Wt 209.0 lb

## 2016-04-14 DIAGNOSIS — Z0000 Encounter for general adult medical examination without abnormal findings: Secondary | ICD-10-CM | POA: Diagnosis not present

## 2016-04-14 DIAGNOSIS — Z125 Encounter for screening for malignant neoplasm of prostate: Secondary | ICD-10-CM | POA: Diagnosis not present

## 2016-04-14 DIAGNOSIS — G4733 Obstructive sleep apnea (adult) (pediatric): Secondary | ICD-10-CM

## 2016-04-14 DIAGNOSIS — R972 Elevated prostate specific antigen [PSA]: Secondary | ICD-10-CM

## 2016-04-14 DIAGNOSIS — Z794 Long term (current) use of insulin: Secondary | ICD-10-CM

## 2016-04-14 DIAGNOSIS — E7800 Pure hypercholesterolemia, unspecified: Secondary | ICD-10-CM | POA: Diagnosis not present

## 2016-04-14 DIAGNOSIS — I10 Essential (primary) hypertension: Secondary | ICD-10-CM

## 2016-04-14 DIAGNOSIS — E119 Type 2 diabetes mellitus without complications: Secondary | ICD-10-CM | POA: Diagnosis not present

## 2016-04-14 DIAGNOSIS — Z9989 Dependence on other enabling machines and devices: Secondary | ICD-10-CM

## 2016-04-14 NOTE — Patient Instructions (Signed)
  HEALTH MAINTENANCE RECOMMENDATIONS:  It is recommended that you get at least 30 minutes of aerobic exercise at least 5 days/week (for weight loss, you may need as much as 60-90 minutes). This can be any activity that gets your heart rate up. This can be divided in 10-15 minute intervals if needed, but try and build up your endurance at least once a week.  Weight bearing exercise is also recommended twice weekly.  Eat a healthy diet with lots of vegetables, fruits and fiber.  "Colorful" foods have a lot of vitamins (ie green vegetables, tomatoes, red peppers, etc).  Limit sweet tea, regular sodas and alcoholic beverages, all of which has a lot of calories and sugar.  Up to 2 alcoholic drinks daily may be beneficial for men (unless trying to lose weight, watch sugars).  Drink a lot of water.  Sunscreen of at least SPF 30 should be used on all sun-exposed parts of the skin when outside between the hours of 10 am and 4 pm (not just when at beach or pool, but even with exercise, golf, tennis, and yard work!)  Use a sunscreen that says "broad spectrum" so it covers both UVA and UVB rays, and make sure to reapply every 1-2 hours.  Remember to change the batteries in your smoke detectors when changing your clock times in the spring and fall.  Use your seat belt every time you are in a car, and please drive safely and not be distracted with cell phones and texting while driving.   [name] , Thank you for taking time to come for your Medicare Wellness Visit. I appreciate your ongoing commitment to your health goals. Please review the following plan we discussed and let me know if I can assist you in the future.   These are the goals we discussed: Goals    None      This is a list of the screening recommended for you and due dates:  Health Maintenance  Topic Date Due  . Eye exam for diabetics  10/02/1955  . Flu Shot  01/06/2016  . Colon Cancer Screening  07/06/2016  . Hemoglobin A1C   07/11/2016  . Complete foot exam   01/08/2017  . Tetanus Vaccine  03/01/2022  . Shingles Vaccine  Completed  .  Hepatitis C: One time screening is recommended by Center for Disease Control  (CDC) for  adults born from [birth year] through 1964.   Completed  . Pneumonia vaccines  Completed   You are up to date on your eye exams, we just don't receive copies to enter into the computer.  Ask them to also send the reports to our office when you go next. You had your flu shot this year--continue yearly high dose flu shots, and let us know when you get them. A1cs are checked by Dr Buddy Duty (date above is 6 months since your last one).

## 2016-04-16 DIAGNOSIS — E669 Obesity, unspecified: Secondary | ICD-10-CM | POA: Diagnosis not present

## 2016-04-16 DIAGNOSIS — Z794 Long term (current) use of insulin: Secondary | ICD-10-CM | POA: Diagnosis not present

## 2016-04-16 DIAGNOSIS — Z6831 Body mass index (BMI) 31.0-31.9, adult: Secondary | ICD-10-CM | POA: Diagnosis not present

## 2016-04-16 DIAGNOSIS — E1165 Type 2 diabetes mellitus with hyperglycemia: Secondary | ICD-10-CM | POA: Diagnosis not present

## 2016-04-20 ENCOUNTER — Telehealth: Payer: Self-pay

## 2016-04-20 NOTE — Telephone Encounter (Signed)
Microalbumin panel rcvd on pt for MA/CR ratio of 55.3 (0.0-30.0) and UMA 6.14 (0.00-1.90). Placed in your folder for review, but wanted to notify you as well. [name]

## 2016-04-21 ENCOUNTER — Ambulatory Visit: Payer: Medicare Other | Admitting: Podiatry

## 2016-05-03 ENCOUNTER — Telehealth: Payer: Self-pay | Admitting: Internal Medicine

## 2016-05-03 NOTE — Telephone Encounter (Signed)
Please tell him I am grateful for the confidence but would ask he see another one of [place] due to the size of my practice.  He will likely get seen sooner that way also

## 2016-05-05 ENCOUNTER — Encounter: Payer: Self-pay | Admitting: Gastroenterology

## 2016-05-05 ENCOUNTER — Other Ambulatory Visit: Payer: Self-pay | Admitting: Family Medicine

## 2016-05-05 NOTE — Telephone Encounter (Signed)
Patient scheduled recall colonoscopy with Dr.Danis.

## 2016-05-14 DIAGNOSIS — Z7984 Long term (current) use of oral hypoglycemic drugs: Secondary | ICD-10-CM | POA: Diagnosis not present

## 2016-05-14 DIAGNOSIS — Z794 Long term (current) use of insulin: Secondary | ICD-10-CM | POA: Diagnosis not present

## 2016-05-14 DIAGNOSIS — E119 Type 2 diabetes mellitus without complications: Secondary | ICD-10-CM | POA: Diagnosis not present

## 2016-05-18 ENCOUNTER — Encounter: Payer: Self-pay | Admitting: Family Medicine

## 2016-05-20 ENCOUNTER — Other Ambulatory Visit: Payer: Self-pay | Admitting: Family Medicine

## 2016-05-28 DIAGNOSIS — H9311 Tinnitus, right ear: Secondary | ICD-10-CM | POA: Diagnosis not present

## 2016-05-28 DIAGNOSIS — H93291 Other abnormal auditory perceptions, right ear: Secondary | ICD-10-CM | POA: Diagnosis not present

## 2016-05-28 DIAGNOSIS — H6123 Impacted cerumen, bilateral: Secondary | ICD-10-CM | POA: Diagnosis not present

## 2016-06-04 ENCOUNTER — Other Ambulatory Visit: Payer: Self-pay | Admitting: Family Medicine

## 2016-06-08 ENCOUNTER — Other Ambulatory Visit: Payer: Self-pay | Admitting: Medical

## 2016-06-15 ENCOUNTER — Other Ambulatory Visit: Payer: Medicare Other

## 2016-06-15 DIAGNOSIS — R972 Elevated prostate specific antigen [PSA]: Secondary | ICD-10-CM | POA: Diagnosis not present

## 2016-06-15 LAB — PSA: PSA: 1.8 ng/mL (ref <=4.0)

## 2016-06-29 ENCOUNTER — Ambulatory Visit (AMBULATORY_SURGERY_CENTER): Payer: Self-pay | Admitting: *Deleted

## 2016-06-29 VITALS — Ht 68.5 in | Wt 211.8 lb

## 2016-06-29 DIAGNOSIS — Z86010 Personal history of colonic polyps: Secondary | ICD-10-CM

## 2016-06-29 MED ORDER — NA SULFATE-K SULFATE-MG SULF 17.5-3.13-1.6 GM/177ML PO SOLN: ORAL | 0 refills | Status: DC

## 2016-06-29 NOTE — Progress Notes (Signed)
No allergies to eggs or soy. No problems with anesthesia.  Pt given Emmi instructions for colonoscopy  No oxygen use  No diet drug use  

## 2016-06-30 ENCOUNTER — Ambulatory Visit: Payer: Medicare Other | Admitting: Podiatry

## 2016-07-07 ENCOUNTER — Encounter: Payer: Self-pay | Admitting: Gastroenterology

## 2016-07-07 ENCOUNTER — Telehealth: Payer: Self-pay | Admitting: Gastroenterology

## 2016-07-07 ENCOUNTER — Ambulatory Visit (AMBULATORY_SURGERY_CENTER): Payer: Medicare Other | Admitting: Gastroenterology

## 2016-07-07 VITALS — BP 146/83 | HR 56 | Temp 98.9°F | Resp 19 | Ht 68.0 in | Wt 211.0 lb

## 2016-07-07 DIAGNOSIS — Z86010 Personal history of colonic polyps: Secondary | ICD-10-CM | POA: Diagnosis not present

## 2016-07-07 DIAGNOSIS — Z1211 Encounter for screening for malignant neoplasm of colon: Secondary | ICD-10-CM | POA: Diagnosis not present

## 2016-07-07 DIAGNOSIS — D123 Benign neoplasm of transverse colon: Secondary | ICD-10-CM

## 2016-07-07 LAB — GLUCOSE, CAPILLARY
GLUCOSE-CAPILLARY: 127 mg/dL — AB (ref 65–99)
GLUCOSE-CAPILLARY: 131 mg/dL — AB (ref 65–99)

## 2016-07-07 MED ORDER — SODIUM CHLORIDE 0.9 % IV SOLN: 500.0000 mL | INTRAVENOUS | Status: DC

## 2016-07-07 NOTE — Patient Instructions (Signed)
YOU HAD AN ENDOSCOPIC PROCEDURE TODAY AT THE [facility]:   Refer to the procedure report that was given to you for any specific questions about what was found during the examination.  If the procedure report does not answer your questions, please call your gastroenterologist to clarify.  If you requested that your care partner not be given the details of your procedure findings, then the procedure report has been included in a sealed envelope for you to review at your convenience later.  YOU SHOULD EXPECT: Some feelings of bloating in the abdomen. Passage of more gas than usual.  Walking can help get rid of the air that was put into your GI tract during the procedure and reduce the bloating. If you had a lower endoscopy (such as a colonoscopy or flexible sigmoidoscopy) you may notice spotting of blood in your stool or on the toilet paper. If you underwent a bowel prep for your procedure, you may not have a normal bowel movement for a few days.  Please Note:  You might notice some irritation and congestion in your nose or some drainage.  This is from the oxygen used during your procedure.  There is no need for concern and it should clear up in a day or so.  SYMPTOMS TO REPORT IMMEDIATELY:   Following lower endoscopy (colonoscopy or flexible sigmoidoscopy):  Excessive amounts of blood in the stool  Significant tenderness or worsening of abdominal pains  Swelling of the abdomen that is new, acute  Fever of 100F or higher    For urgent or emergent issues, a gastroenterologist can be reached at any hour by calling [phone].   DIET:  We do recommend a small meal at first, but then you may proceed to your regular diet.  Drink plenty of fluids but you should avoid alcoholic beverages for 24 hours.  ACTIVITY:  You should plan to take it easy for the rest of today and you should NOT DRIVE or use heavy machinery until tomorrow (because of the sedation medicines used during the test).     FOLLOW UP: Our staff will call the number listed on your records the next business day following your procedure to check on you and address any questions or concerns that you may have regarding the information given to you following your procedure. If we do not reach you, we will leave a message.  However, if you are feeling well and you are not experiencing any problems, there is no need to return our call.  We will assume that you have returned to your regular daily activities without incident.  If any biopsies were taken you will be contacted by phone or by letter within the next 1-3 weeks.  Please call us at [phone] if you have not heard about the biopsies in 3 weeks.    SIGNATURES/CONFIDENTIALITY: You and/or your care partner have signed paperwork which will be entered into your electronic medical record.  These signatures attest to the fact that that the information above on your After Visit Summary has been reviewed and is understood.  Full responsibility of the confidentiality of this discharge information lies with you and/or your care-partner.   Resume medications. Information given on polyps and diverticulosis. 

## 2016-07-07 NOTE — Op Note (Signed)
[place] Patient Name: [name] Procedure Date: 07/07/2016 8:41 AM MRN: [id number] Endoscopist: Mallie Mussel L. Loletha Carrow , MD Age: 71 Referring MD:  Date of Birth: [birth year] Gender: Male Account #: [id number] Procedure:                Colonoscopy Indications:              Surveillance: Personal history of adenomatous                            polyps on last colonoscopy > 3 years ago (TA x 2,                            one greater than 13mm) Medicines:                Monitored Anesthesia Care Procedure:                Pre-Anesthesia Assessment:                           - Prior to the procedure, a History and Physical                            was performed, and patient medications and                            allergies were reviewed. The patient's tolerance of                            previous anesthesia was also reviewed. The risks                            and benefits of the procedure and the sedation                            options and risks were discussed with the patient.                            All questions were answered, and informed consent                            was obtained. Anticoagulants: The patient has taken                            aspirin. It was decided not to withhold this                            medication prior to the procedure. ASA Grade                            Assessment: III - A patient with severe systemic                            disease. After reviewing the risks and benefits,  the patient was deemed in satisfactory condition to                            undergo the procedure.                           After obtaining informed consent, the colonoscope                            was passed under direct vision. Throughout the                            procedure, the patient's blood pressure, pulse, and                            oxygen saturations were monitored continuously. The               CF-HQ190 was introduced through the anus and                            advanced to the the cecum, identified by                            appendiceal orifice and ileocecal valve. The                            colonoscopy was performed without difficulty. The                            patient tolerated the procedure well. The quality                            of the bowel preparation was excellent. The                            ileocecal valve, appendiceal orifice, and rectum                            were photographed. The quality of the bowel                            preparation was evaluated using the BBPS [facility]                            Bowel Preparation Scale) with scores of: Right                            Colon = 3, Transverse Colon = 3 and Left Colon = 2.                            The total BBPS score equals 8. The bowel                            preparation used was SUPREP.  Scope In: 8:54:23 AM Scope Out: 9:09:51 AM Scope Withdrawal Time: 0 hours 12 minutes 20 seconds  Total Procedure Duration: 0 hours 15 minutes 28 seconds  Findings:                 The perianal and digital rectal examinations were                            normal.                           Multiple small-mouthed diverticula were found in                            the left colon.                           A 4 mm polyp was found in the distal transverse                            colon. The polyp was sessile. The polyp was removed                            with a cold snare. Resection and retrieval were                            complete.                           The exam was otherwise without abnormality on                            direct and retroflexion views. Complications:            No immediate complications. Estimated Blood Loss:     Estimated blood loss: none. Impression:               - Diverticulosis in the left colon.                           - One 4 mm polyp in the  distal transverse colon,                            removed with a cold snare. Resected and retrieved.                           - The examination was otherwise normal on direct                            and retroflexion views. Recommendation:           - Patient has a contact number available for                            emergencies. The signs and symptoms of potential  delayed complications were discussed with the                            patient. Return to normal activities tomorrow.                            Written discharge instructions were provided to the                            patient.                           - Resume previous diet.                           - Continue present medications.                           - Await pathology results.                           - Repeat colonoscopy is recommended for                            surveillance. The colonoscopy date will be                            determined after pathology results from today's                            exam become available for review. Henry L. Loletha Carrow, MD 07/07/2016 9:13:27 AM This report has been signed electronically.

## 2016-07-07 NOTE — Telephone Encounter (Signed)
Oncall Note Patient called at 9PM to report that he has had a BM as advised per his instructions, he completed first half of split dose, no baseline constipation. He is a diabetic. Advised him to continue to drink clear liquids, he would have BM soon. Complete the 2nd half of prep as instructed  K. Denzil Magnuson , MD

## 2016-07-07 NOTE — Progress Notes (Signed)
Pt's states no medical or surgical changes since previsit or office visit. 

## 2016-07-07 NOTE — Progress Notes (Signed)
Called to room to assist during endoscopic procedure.  Patient ID and intended procedure confirmed with present staff. Received instructions for my participation in the procedure from the performing physician.  

## 2016-07-07 NOTE — Progress Notes (Signed)
A and O x3. Report to RN. Tolerated MAC anesthesia well.

## 2016-07-08 ENCOUNTER — Telehealth: Payer: Self-pay

## 2016-07-08 NOTE — Telephone Encounter (Signed)
  Follow up Call-  Call back number 07/07/2016  Post procedure Call Back phone  # [phone]  Permission to leave phone message Yes  Some recent data might be hidden     Patient questions:  Do you have a fever, pain , or abdominal swelling? No. Pain Score  0 *  Have you tolerated food without any problems? Yes.    Have you been able to return to your normal activities? Yes.    Do you have any questions about your discharge instructions: Diet   No. Medications  No. Follow up visit  No.  Do you have questions or concerns about your Care? No.  Actions: * If pain score is 4 or above: No action needed, pain <4.

## 2016-07-13 ENCOUNTER — Encounter: Payer: Self-pay | Admitting: Gastroenterology

## 2016-07-15 ENCOUNTER — Other Ambulatory Visit: Payer: Medicare Other

## 2016-07-27 DIAGNOSIS — Z6832 Body mass index (BMI) 32.0-32.9, adult: Secondary | ICD-10-CM | POA: Diagnosis not present

## 2016-07-27 DIAGNOSIS — E669 Obesity, unspecified: Secondary | ICD-10-CM | POA: Diagnosis not present

## 2016-07-27 DIAGNOSIS — Z794 Long term (current) use of insulin: Secondary | ICD-10-CM | POA: Diagnosis not present

## 2016-07-27 DIAGNOSIS — E1165 Type 2 diabetes mellitus with hyperglycemia: Secondary | ICD-10-CM | POA: Diagnosis not present

## 2016-07-27 LAB — HEMOGLOBIN A1C: HEMOGLOBIN A1C: 7.7

## 2016-07-28 ENCOUNTER — Ambulatory Visit (INDEPENDENT_AMBULATORY_CARE_PROVIDER_SITE_OTHER): Payer: Medicare Other | Admitting: Podiatry

## 2016-07-28 ENCOUNTER — Encounter: Payer: Self-pay | Admitting: Podiatry

## 2016-07-28 VITALS — Ht 69.0 in | Wt 209.0 lb

## 2016-07-28 DIAGNOSIS — B351 Tinea unguium: Secondary | ICD-10-CM | POA: Diagnosis not present

## 2016-07-28 DIAGNOSIS — E119 Type 2 diabetes mellitus without complications: Secondary | ICD-10-CM | POA: Diagnosis not present

## 2016-07-28 DIAGNOSIS — M79676 Pain in unspecified toe(s): Secondary | ICD-10-CM | POA: Diagnosis not present

## 2016-07-28 NOTE — Progress Notes (Signed)
Patient ID: [name], male   DOB: 09/06/1945, 70 y.o.   MRN: [id number] Complaint:  Visit Type: Patient returns to my office for continued preventative foot care services. Complaint: Patient states" my nails have grown long and thick and become painful to walk and wear shoes" Patient has been diagnosed with DM with no foot complications. The patient presents for preventative foot care services. No changes to ROS  Podiatric Exam: Vascular: dorsalis pedis and posterior tibial pulses are palpable bilateral. Capillary return is immediate. Temperature gradient is WNL. Skin turgor WNL  Sensorium: Normal Semmes Weinstein monofilament test. Normal tactile sensation bilaterally. Nail Exam: Pt has thick disfigured discolored nails with subungual debris noted bilateral entire nail hallux through fifth toenails Ulcer Exam: There is no evidence of ulcer or pre-ulcerative changes or infection. Orthopedic Exam: Muscle tone and strength are WNL. No limitations in general ROM. No crepitus or effusions noted. Foot type and digits show no abnormalities. Bony prominences are unremarkable. Skin: No Porokeratosis. No infection or ulcers  Diagnosis:  Onychomycosis, , Pain in right toe, pain in left toes  Treatment & Plan Procedures and Treatment: Consent by patient was obtained for treatment procedures. The patient understood the discussion of treatment and procedures well. All questions were answered thoroughly reviewed. Debridement of mycotic and hypertrophic toenails, 1 through 5 bilateral and clearing of subungual debris. No ulceration, no infection noted.  Return Visit-Office Procedure: Patient instructed to return to the office for a follow up visit 9 weeks  for continued evaluation and treatment.  Sigifredo Pignato DPM 

## 2016-08-03 ENCOUNTER — Other Ambulatory Visit: Payer: Self-pay | Admitting: Family Medicine

## 2016-08-20 ENCOUNTER — Other Ambulatory Visit: Payer: Self-pay | Admitting: Family Medicine

## 2016-08-30 ENCOUNTER — Other Ambulatory Visit: Payer: Self-pay | Admitting: Medical

## 2016-09-29 ENCOUNTER — Ambulatory Visit (INDEPENDENT_AMBULATORY_CARE_PROVIDER_SITE_OTHER): Payer: Medicare Other | Admitting: Podiatry

## 2016-09-29 ENCOUNTER — Encounter: Payer: Self-pay | Admitting: Podiatry

## 2016-09-29 DIAGNOSIS — B351 Tinea unguium: Secondary | ICD-10-CM

## 2016-09-29 DIAGNOSIS — E119 Type 2 diabetes mellitus without complications: Secondary | ICD-10-CM

## 2016-09-29 DIAGNOSIS — M79676 Pain in unspecified toe(s): Secondary | ICD-10-CM

## 2016-09-29 NOTE — Progress Notes (Signed)
Patient ID: [name], male   DOB: [birth year], 71 y.o.   MRN: [id number] Complaint:  Visit Type: Patient returns to my office for continued preventative foot care services. Complaint: Patient states" my nails have grown long and thick and become painful to walk and wear shoes" Patient has been diagnosed with DM with no foot complications. The patient presents for preventative foot care services. No changes to ROS  Podiatric Exam: Vascular: dorsalis pedis and posterior tibial pulses are palpable bilateral. Capillary return is immediate. Temperature gradient is WNL. Skin turgor WNL  Sensorium: Normal Semmes Weinstein monofilament test. Normal tactile sensation bilaterally. Nail Exam: Pt has thick disfigured discolored nails with subungual debris noted bilateral entire nail hallux through fifth toenails Ulcer Exam: There is no evidence of ulcer or pre-ulcerative changes or infection. Orthopedic Exam: Muscle tone and strength are WNL. No limitations in general ROM. No crepitus or effusions noted. Foot type and digits show no abnormalities. Bony prominences are unremarkable. Skin: No Porokeratosis. No infection or ulcers  Diagnosis:  Onychomycosis, , Pain in right toe, pain in left toes  Treatment & Plan Procedures and Treatment: Consent by patient was obtained for treatment procedures. The patient understood the discussion of treatment and procedures well. All questions were answered thoroughly reviewed. Debridement of mycotic and hypertrophic toenails, 1 through 5 bilateral and clearing of subungual debris. No ulceration, no infection noted.  Return Visit-Office Procedure: Patient instructed to return to the office for a follow up visit 9 weeks  for continued evaluation and treatment.  Gardiner Barefoot DPM

## 2016-10-04 ENCOUNTER — Telehealth: Payer: Self-pay | Admitting: Family Medicine

## 2016-10-04 DIAGNOSIS — E7800 Pure hypercholesterolemia, unspecified: Secondary | ICD-10-CM

## 2016-10-04 DIAGNOSIS — Z794 Long term (current) use of insulin: Secondary | ICD-10-CM

## 2016-10-04 DIAGNOSIS — E119 Type 2 diabetes mellitus without complications: Secondary | ICD-10-CM

## 2016-10-04 DIAGNOSIS — I10 Essential (primary) hypertension: Secondary | ICD-10-CM

## 2016-10-04 NOTE — Addendum Note (Signed)
Addended byRita Ohara on: 10/04/2016 01:52 PM   Modules accepted: Orders

## 2016-10-04 NOTE — Telephone Encounter (Signed)
Orders entered

## 2016-10-04 NOTE — Telephone Encounter (Signed)
Looked back at last note and it said that he is supposed to call week before for lab orders. He is scheduled to come in this Thurs for med check.

## 2016-10-04 NOTE — Telephone Encounter (Signed)
Pt has an appt this Thursday, but he has not about needing labs.  Please call pt and advise [phone] I do not see note.

## 2016-10-04 NOTE — Telephone Encounter (Signed)
Called patient and let him know that he can call back and schedule fasting lab visit for tomorrow or Wed.

## 2016-10-05 ENCOUNTER — Other Ambulatory Visit: Payer: Medicare Other

## 2016-10-05 DIAGNOSIS — E119 Type 2 diabetes mellitus without complications: Secondary | ICD-10-CM | POA: Diagnosis not present

## 2016-10-05 DIAGNOSIS — E7800 Pure hypercholesterolemia, unspecified: Secondary | ICD-10-CM | POA: Diagnosis not present

## 2016-10-05 DIAGNOSIS — Z794 Long term (current) use of insulin: Secondary | ICD-10-CM | POA: Diagnosis not present

## 2016-10-05 DIAGNOSIS — I10 Essential (primary) hypertension: Secondary | ICD-10-CM | POA: Diagnosis not present

## 2016-10-05 LAB — COMPREHENSIVE METABOLIC PANEL
ALBUMIN: 4.4 g/dL (ref 3.6–5.1)
ALT: 22 U/L (ref 9–46)
AST: 18 U/L (ref 10–35)
Alkaline Phosphatase: 28 U/L — ABNORMAL LOW (ref 40–115)
BUN: 22 mg/dL (ref 7–25)
CALCIUM: 9.4 mg/dL (ref 8.6–10.3)
CHLORIDE: 103 mmol/L (ref 98–110)
CO2: 21 mmol/L (ref 20–31)
Creat: 1.26 mg/dL — ABNORMAL HIGH (ref 0.70–1.18)
Glucose, Bld: 200 mg/dL — ABNORMAL HIGH (ref 65–99)
Potassium: 4.1 mmol/L (ref 3.5–5.3)
Sodium: 139 mmol/L (ref 135–146)
TOTAL PROTEIN: 6.6 g/dL (ref 6.1–8.1)
Total Bilirubin: 0.8 mg/dL (ref 0.2–1.2)

## 2016-10-05 LAB — LIPID PANEL
CHOLESTEROL: 117 mg/dL (ref <200)
HDL: 26 mg/dL — AB (ref >40)
LDL CALC: 54 mg/dL (ref <100)
TRIGLYCERIDES: 184 mg/dL — AB (ref <150)
Total CHOL/HDL Ratio: 4.5 Ratio (ref <5.0)
VLDL: 37 mg/dL — AB (ref <30)

## 2016-10-06 NOTE — Progress Notes (Signed)
Chief Complaint  Patient presents with  . Hypertension    nonfasting (labs already done) med check. Last A1c 7.4 per patient in Feb-seeing Dr.Kerr again in June.    He reports 2 episodes of slight, very brief numbness at his left cheek.  Lasts only 20-30 seconds.  He had it twice, in the morning, a week ago and once the week before that. Went to the dentist within the last 2 weeks, and all was okay.  No other associated symptoms, weakness, slurred speech, word finding difficulty. No associated headache, no hypoglycemia or hyperglycemia, no jaw claudication, vision changes.  Hypertension follow-up: Blood pressures at the pharmacy and other doctors (checked occasionally) are usually 130/70. Denies dizziness, headaches, chest pain. Denies side effects of medications.   Hyperlipidemia follow-up: Patient is reportedly following a low-fat, low cholesterol diet. Red meat once a week, 1 fried egg 4x/week, oatmeal on the other days (used to eat egg daily). Compliant with taking his Crestor and Tricor, and denies medication side effects. He was concerned about the cost of the Crestor, so last year dose was doubled for him to take 1/2 tablet (to help with cost).  ED: 1/2 tablet of Viagra is usually effective.  Elevated PSA velocity--improved on last check Lab Results  Component Value Date   PSA 1.8 06/15/2016   PSA 3.5 04/12/2016   PSA 1.20 01/20/2015    OSA: Uses CPAP machine nightly. He feels better when using it. Denies any problems. Feels refreshed when he wakes up.  DM: managed by Dr. Buddy Duty. His last A1c was 7.4 or 7.2 in Feb/March. It had previously been 8.4 in 01/2016. Jardiance was too expensive, hadn't been taking it prior to that high check.  Instead of resuming Jardiance, they titrated insulin up to 60 U in morning, 70 U in the evening (70/30).   Sugars running 120-130 in the mornings and 2 hours after dinner. We did not receive the last visit notes from Dr. Buddy Duty (from [facility] visit).  He  is due to see him again in early June.  Denies hypoglycemia. Denies polydipsia, polyuria.Goes to the eye doctor yearly, went last week (they sent report to Dr. Buddy Duty only). Gets routine care at podiatrist.  Abdominal bloating--only intermittent, overall improved.  Had colonoscopy in January 2018--had tubular adenoma.  Repeat in 5 years.  Exercise--walks 40 minutes 5 days/week  PMH, PSH, SH reviewed and updated (new Bernedoodle puppy!)  Outpatient Encounter Prescriptions as of 10/07/2016  Medication Sig Note  . ACCU-CHEK FASTCLIX LANCETS MISC use three times a day WHEN CHECKING BLOOD SUGAR 10/10/2015: Received from: External Pharmacy  . ACCU-CHEK SMARTVIEW test strip 3 (three) times daily. for testing 10/10/2015: Received from: External Pharmacy  . amLODipine (NORVASC) 5 MG tablet take 1 tablet by mouth once daily   . aspirin 81 MG tablet Take 81 mg by mouth daily.     . B-D INS SYRINGE 0.5CC/31GX5/16 31G X 5/16" 0.5 ML MISC  12/24/2013: Received from: External Pharmacy  . BD INSULIN SYRINGE ULTRAFINE 31G X 15/64" 1 ML MISC  10/16/2014: Received from: External Pharmacy  . fenofibrate (TRICOR) 145 MG tablet take 1 tablet by mouth once daily   . losartan-hydrochlorothiazide (HYZAAR) 100-12.5 MG tablet Take 1 tablet by mouth daily.   . metFORMIN (GLUCOPHAGE-XR) 500 MG 24 hr tablet Take 1,000 mg by mouth 2 (two) times daily.   Marland Kitchen NOVOLIN 70/30 RELION (70-30) 100 UNIT/ML injection Inject 60 Units into the skin daily with breakfast. And 70 units with dinner 10/16/2014: Received  from: External Pharmacy  . PAZEO 0.7 % SOLN 1 drop as needed.    . rosuvastatin (CRESTOR) 20 MG tablet Take 0.5 tablets (10 mg total) by mouth daily.   Marland Kitchen VIAGRA 100 MG tablet TAKE 1/2 -1 TABLET BY MOUTH AS NEEDED FOR ERECTILE DYSFUCTION   . [DISCONTINUED] losartan-hydrochlorothiazide (HYZAAR) 100-12.5 MG tablet take 1 tablet by mouth once daily   . [DISCONTINUED] rosuvastatin (CRESTOR) 20 MG tablet TAKE 1/2 TABLET BY MOUTH DAILY    . [DISCONTINUED] fenofibrate (TRICOR) 145 MG tablet take 1 tablet by mouth once daily    Facility-Administered Encounter Medications as of 10/07/2016  Medication  . 0.9 %  sodium chloride infusion   No Known Allergies   ROS;  Denies fever, chills, headaches, syncope, numbness/tingling/neuropathy, nausea, vomiting, heartburn, bowel changes, urinary complaints, bleeding, bruising, rashes.  Some stiffness in his back in the mornings only, no other joint pains.  Some seasonal allergies flaring, controlled by zyrtec. Short-lived cheek numbness on 2 occasions, per HPI   PHYSICAL EXAM:  BP 136/70 (BP Location: Left Arm, Patient Position: Sitting, Cuff Size: Normal)   Pulse 60   Ht _0  (1.753 m)   Wt 211 lb 12.8 oz (96.1 kg)   BMI 31.28 kg/m   Well developed, pleasant, overweight male in no distress, in good spirits HEENT: PERRL, EOMI, conjunctiva and sclera are clear.  OP clear Neck: no lymphadenopathy, thyromegaly or carotid bruit Heart: regular rate and rhythm Lungs: clear bilaterally Back: no CVA tenderness, SI tenderness Abdomen: soft, nontender, no organomegaly or mass Extremities: no edema, normal pulses Psych: normal mood, affect, hygiene and grooming Neuro: alert and oriented, cranial nerves intact.  DTRs 2+ and symmetric. Normal finger to noise, strength, sensation, gait Skin: no visible rashes, normal turgor  Lab Results  Component Value Date   CHOL 117 10/05/2016   HDL 26 (L) 10/05/2016   LDLCALC 54 10/05/2016   TRIG 184 (H) 10/05/2016   CHOLHDL 4.5 10/05/2016     Chemistry      Component Value Date/Time   NA 139 10/05/2016 0731   K 4.1 10/05/2016 0731   CL 103 10/05/2016 0731   CO2 21 10/05/2016 0731   BUN 22 10/05/2016 0731   CREATININE 1.26 (H) 10/05/2016 0731      Component Value Date/Time   CALCIUM 9.4 10/05/2016 0731   ALKPHOS 28 (L) 10/05/2016 0731   AST 18 10/05/2016 0731   ALT 22 10/05/2016 0731   BILITOT 0.8 10/05/2016 0731     Fasting  glucose 200   ASSESSMENT/PLAN:  Essential hypertension, benign - well controlled on current regimen - Plan: losartan-hydrochlorothiazide (HYZAAR) 100-12.5 MG tablet  Pure hypercholesterolemia - TG elevated (along with high sugar)--diet reviewed in detail. Continue current medications and plan to recheck 6 mos. LDL at goal. Risks of myopathy reviewed - Plan: rosuvastatin (CRESTOR) 20 MG tablet  Type 2 diabetes mellitus without complication, with long-term current use of insulin (HCC) - per Dr. Buddy Duty. Will get last notes.  Sugars good per pt report, but 200 on our fasting labs.   Adenomatous polyp of colon, unspecified part of colon - repeat 5 years   f/u 6 mos AWV with labs prior--PSA, TSH, CBC, c-met, lipids   shingrix discussed and recommended to get from pharmacy   Get last note from Dr. Cindra Eves last visit, and any diabetic eye exam. Send labs to Dr. Buddy Duty    Your kidney tests bumped a little bit--try and stay well hydrated, and avoid ibuprofen use.  Try  using Tylenol Arthritis instead.  Your fasting sugar was 200 and triglycerides were above goal as well. The HDL was very low--do your best to limit the sugar/carbs/sweets/fried foods in your diet and get regular exercise. Continue the fish oil and your current medications (no changes made).

## 2016-10-07 ENCOUNTER — Ambulatory Visit (INDEPENDENT_AMBULATORY_CARE_PROVIDER_SITE_OTHER): Payer: Medicare Other | Admitting: Family Medicine

## 2016-10-07 ENCOUNTER — Encounter: Payer: Self-pay | Admitting: Family Medicine

## 2016-10-07 VITALS — BP 136/70 | HR 60 | Ht 69.0 in | Wt 211.8 lb

## 2016-10-07 DIAGNOSIS — Z5181 Encounter for therapeutic drug level monitoring: Secondary | ICD-10-CM | POA: Diagnosis not present

## 2016-10-07 DIAGNOSIS — E7800 Pure hypercholesterolemia, unspecified: Secondary | ICD-10-CM | POA: Diagnosis not present

## 2016-10-07 DIAGNOSIS — D126 Benign neoplasm of colon, unspecified: Secondary | ICD-10-CM

## 2016-10-07 DIAGNOSIS — Z125 Encounter for screening for malignant neoplasm of prostate: Secondary | ICD-10-CM | POA: Diagnosis not present

## 2016-10-07 DIAGNOSIS — I10 Essential (primary) hypertension: Secondary | ICD-10-CM | POA: Diagnosis not present

## 2016-10-07 DIAGNOSIS — Z794 Long term (current) use of insulin: Secondary | ICD-10-CM

## 2016-10-07 DIAGNOSIS — E119 Type 2 diabetes mellitus without complications: Secondary | ICD-10-CM

## 2016-10-07 DIAGNOSIS — R972 Elevated prostate specific antigen [PSA]: Secondary | ICD-10-CM | POA: Diagnosis not present

## 2016-10-07 MED ORDER — LOSARTAN POTASSIUM-HCTZ 100-12.5 MG PO TABS: 1.0000 | ORAL_TABLET | Freq: Every day | ORAL | 1 refills | Status: DC

## 2016-10-07 MED ORDER — ROSUVASTATIN CALCIUM 20 MG PO TABS: 10.0000 mg | ORAL_TABLET | Freq: Every day | ORAL | 1 refills | Status: DC

## 2016-10-07 NOTE — Patient Instructions (Signed)
  Your kidney tests bumped a little bit--try and stay well hydrated, and avoid ibuprofen use.  Try using Tylenol Arthritis instead.  Your fasting sugar was 200 and triglycerides were above goal as well. The HDL was very low--do your best to limit the sugar/carbs/sweets/fried foods in your diet and get regular exercise. Continue the fish oil and your current medications (no changes made).  I recommend getting the new shingles vaccine (Shingrix). You will need to check with your insurance to see if it is covered, and if covered by Medicare Part D, you need to get from the pharmacy rather than our office.  It is a series of 2 injections, spaced 2 months apart. Please contact our office if/when you received the vaccine so we can document it in your chart.

## 2016-10-11 ENCOUNTER — Encounter: Payer: Self-pay | Admitting: *Deleted

## 2016-10-12 ENCOUNTER — Encounter: Payer: Self-pay | Admitting: Family Medicine

## 2016-10-21 ENCOUNTER — Encounter: Payer: Self-pay | Admitting: Family Medicine

## 2016-11-05 DIAGNOSIS — H6121 Impacted cerumen, right ear: Secondary | ICD-10-CM | POA: Diagnosis not present

## 2016-11-16 DIAGNOSIS — Z794 Long term (current) use of insulin: Secondary | ICD-10-CM | POA: Diagnosis not present

## 2016-11-16 DIAGNOSIS — Z6832 Body mass index (BMI) 32.0-32.9, adult: Secondary | ICD-10-CM | POA: Diagnosis not present

## 2016-11-16 DIAGNOSIS — E669 Obesity, unspecified: Secondary | ICD-10-CM | POA: Diagnosis not present

## 2016-11-16 DIAGNOSIS — E1165 Type 2 diabetes mellitus with hyperglycemia: Secondary | ICD-10-CM | POA: Diagnosis not present

## 2016-11-16 LAB — HEMOGLOBIN A1C: HEMOGLOBIN A1C: 7.7

## 2016-11-22 ENCOUNTER — Other Ambulatory Visit: Payer: Self-pay | Admitting: Medical

## 2016-11-22 ENCOUNTER — Encounter: Payer: Self-pay | Admitting: Family Medicine

## 2016-12-01 ENCOUNTER — Encounter: Payer: Self-pay | Admitting: Podiatry

## 2016-12-01 ENCOUNTER — Ambulatory Visit (INDEPENDENT_AMBULATORY_CARE_PROVIDER_SITE_OTHER): Payer: Medicare Other | Admitting: Podiatry

## 2016-12-01 DIAGNOSIS — B351 Tinea unguium: Secondary | ICD-10-CM | POA: Diagnosis not present

## 2016-12-01 DIAGNOSIS — M79676 Pain in unspecified toe(s): Secondary | ICD-10-CM | POA: Diagnosis not present

## 2016-12-01 DIAGNOSIS — E119 Type 2 diabetes mellitus without complications: Secondary | ICD-10-CM

## 2016-12-01 NOTE — Progress Notes (Signed)
Patient ID: [name], male   DOB: 06/07/1946, 71 y.o.   MRN: [id number] Complaint:  Visit Type: Patient returns to my office for continued preventative foot care services. Complaint: Patient states" my nails have grown long and thick and become painful to walk and wear shoes" Patient has been diagnosed with DM with no foot complications. The patient presents for preventative foot care services. No changes to ROS  Podiatric Exam: Vascular: dorsalis pedis and posterior tibial pulses are palpable bilateral. Capillary return is immediate. Temperature gradient is WNL. Skin turgor WNL  Sensorium: Normal Semmes Weinstein monofilament test. Normal tactile sensation bilaterally. Nail Exam: Pt has thick disfigured discolored nails with subungual debris noted bilateral entire nail hallux through fifth toenails Ulcer Exam: There is no evidence of ulcer or pre-ulcerative changes or infection. Orthopedic Exam: Muscle tone and strength are WNL. No limitations in general ROM. No crepitus or effusions noted. Foot type and digits show no abnormalities. Bony prominences are unremarkable. Skin: No Porokeratosis. No infection or ulcers  Diagnosis:  Onychomycosis, , Pain in right toe, pain in left toes  Treatment & Plan Procedures and Treatment: Consent by patient was obtained for treatment procedures. The patient understood the discussion of treatment and procedures well. All questions were answered thoroughly reviewed. Debridement of mycotic and hypertrophic toenails, 1 through 5 bilateral and clearing of subungual debris. No ulceration, no infection noted.  Return Visit-Office Procedure: Patient instructed to return to the office for a follow up visit 9 weeks  for continued evaluation and treatment.  Arlis Everly DPM 

## 2016-12-04 ENCOUNTER — Other Ambulatory Visit: Payer: Self-pay | Admitting: Family Medicine

## 2016-12-08 ENCOUNTER — Other Ambulatory Visit: Payer: Self-pay | Admitting: Medical

## 2016-12-09 NOTE — Telephone Encounter (Signed)
Can he have a refill on this. 

## 2017-02-01 ENCOUNTER — Encounter: Payer: Self-pay | Admitting: Podiatry

## 2017-02-01 ENCOUNTER — Ambulatory Visit (INDEPENDENT_AMBULATORY_CARE_PROVIDER_SITE_OTHER): Payer: Medicare Other | Admitting: Podiatry

## 2017-02-01 DIAGNOSIS — M79676 Pain in unspecified toe(s): Secondary | ICD-10-CM

## 2017-02-01 DIAGNOSIS — B351 Tinea unguium: Secondary | ICD-10-CM | POA: Diagnosis not present

## 2017-02-01 DIAGNOSIS — E119 Type 2 diabetes mellitus without complications: Secondary | ICD-10-CM

## 2017-02-01 NOTE — Progress Notes (Signed)
Patient ID: [name], male   DOB: 04/06/1946, 71 y.o.   MRN: [id number] Complaint:  Visit Type: Patient returns to my office for continued preventative foot care services. Complaint: Patient states" my nails have grown long and thick and become painful to walk and wear shoes" Patient has been diagnosed with DM with no foot complications. The patient presents for preventative foot care services. No changes to ROS  Podiatric Exam: Vascular: dorsalis pedis and posterior tibial pulses are palpable bilateral. Capillary return is immediate. Temperature gradient is WNL. Skin turgor WNL  Sensorium: Normal Semmes Weinstein monofilament test. Normal tactile sensation bilaterally. Nail Exam: Pt has thick disfigured discolored nails with subungual debris noted bilateral entire nail hallux through fifth toenails Ulcer Exam: There is no evidence of ulcer or pre-ulcerative changes or infection. Orthopedic Exam: Muscle tone and strength are WNL. No limitations in general ROM. No crepitus or effusions noted. Foot type and digits show no abnormalities. Bony prominences are unremarkable. Skin: No Porokeratosis. No infection or ulcers  Diagnosis:  Onychomycosis, , Pain in right toe, pain in left toes  Treatment & Plan Procedures and Treatment: Consent by patient was obtained for treatment procedures. The patient understood the discussion of treatment and procedures well. All questions were answered thoroughly reviewed. Debridement of mycotic and hypertrophic toenails, 1 through 5 bilateral and clearing of subungual debris. No ulceration, no infection noted.  Return Visit-Office Procedure: Patient instructed to return to the office for a follow up visit 9 weeks  for continued evaluation and treatment.  Gardiner Barefoot DPM

## 2017-03-14 ENCOUNTER — Telehealth: Payer: Self-pay | Admitting: Medical

## 2017-03-14 MED ORDER — FENOFIBRATE 145 MG PO TABS: 145.0000 mg | ORAL_TABLET | Freq: Every day | ORAL | 0 refills | Status: DC

## 2017-03-14 NOTE — Telephone Encounter (Signed)
Forwarding to veronica

## 2017-03-14 NOTE — Telephone Encounter (Signed)
Done

## 2017-03-14 NOTE — Telephone Encounter (Signed)
Recv'd fax request from [facility] Fenofibrate 145 mg #90

## 2017-03-15 ENCOUNTER — Telehealth: Payer: Self-pay | Admitting: Family Medicine

## 2017-03-28 NOTE — Telephone Encounter (Signed)
dt ?

## 2017-03-29 DIAGNOSIS — Z23 Encounter for immunization: Secondary | ICD-10-CM | POA: Diagnosis not present

## 2017-04-13 DIAGNOSIS — E669 Obesity, unspecified: Secondary | ICD-10-CM | POA: Diagnosis not present

## 2017-04-13 DIAGNOSIS — E1165 Type 2 diabetes mellitus with hyperglycemia: Secondary | ICD-10-CM | POA: Diagnosis not present

## 2017-04-13 DIAGNOSIS — Z794 Long term (current) use of insulin: Secondary | ICD-10-CM | POA: Diagnosis not present

## 2017-04-13 DIAGNOSIS — R197 Diarrhea, unspecified: Secondary | ICD-10-CM | POA: Diagnosis not present

## 2017-04-13 LAB — HEMOGLOBIN A1C: Hemoglobin A1C: 8.1

## 2017-04-20 ENCOUNTER — Ambulatory Visit (INDEPENDENT_AMBULATORY_CARE_PROVIDER_SITE_OTHER): Payer: Medicare Other | Admitting: Podiatry

## 2017-04-20 ENCOUNTER — Encounter: Payer: Self-pay | Admitting: Podiatry

## 2017-04-20 DIAGNOSIS — B351 Tinea unguium: Secondary | ICD-10-CM

## 2017-04-20 DIAGNOSIS — E119 Type 2 diabetes mellitus without complications: Secondary | ICD-10-CM | POA: Diagnosis not present

## 2017-04-20 DIAGNOSIS — M79676 Pain in unspecified toe(s): Secondary | ICD-10-CM | POA: Diagnosis not present

## 2017-04-20 NOTE — Progress Notes (Addendum)
Patient ID: [name], male   DOB: 03/16/1946, 71 y.o.   MRN: [id number] Complaint:  Visit Type: Patient returns to my office for continued preventative foot care services. Complaint: Patient states" my nails have grown long and thick and become painful to walk and wear shoes" Patient has been diagnosed with DM with no foot complications. The patient presents for preventative foot care services. No changes to ROS  Podiatric Exam: Vascular: dorsalis pedis and posterior tibial pulses are palpable bilateral. Capillary return is immediate. Temperature gradient is WNL. Skin turgor WNL  Sensorium: Normal Semmes Weinstein monofilament test. Normal tactile sensation bilaterally. Nail Exam: Pt has thick disfigured discolored nails with subungual debris noted bilateral entire nail hallux through fifth toenails Ulcer Exam: There is no evidence of ulcer or pre-ulcerative changes or infection. Orthopedic Exam: Muscle tone and strength are WNL. No limitations in general ROM. No crepitus or effusions noted. Foot type and digits show no abnormalities. Bony prominences are unremarkable. Skin: No Porokeratosis. No infection or ulcers  Diagnosis:  Onychomycosis, , Pain in right toe, pain in left toes  Treatment & Plan Procedures and Treatment: Consent by patient was obtained for treatment procedures. The patient understood the discussion of treatment and procedures well. All questions were answered thoroughly reviewed. Debridement of mycotic and hypertrophic toenails, 1 through 5 bilateral and clearing of subungual debris. No ulceration, no infection noted.  ABN signed for 2018. Return Visit-Office Procedure: Patient instructed to return to the office for a follow up visit 9 weeks  for continued evaluation and treatment.  Gardiner Barefoot DPM

## 2017-05-02 ENCOUNTER — Other Ambulatory Visit: Payer: Self-pay | Admitting: Family Medicine

## 2017-05-02 NOTE — Telephone Encounter (Signed)
Yes, patient wants #2 as it is expensive.

## 2017-05-02 NOTE — Telephone Encounter (Signed)
Is this okay to refill? 

## 2017-05-02 NOTE — Telephone Encounter (Signed)
This is only requesting TWO tablets.  Is that really all he wants?  I typically will send in #30, and leave it up to him and the pharmacy to figure out how many he wants to pick up at a time, leaving the rest active, so that we don't need to be contacted each time.

## 2017-05-12 ENCOUNTER — Other Ambulatory Visit: Payer: Self-pay

## 2017-05-20 ENCOUNTER — Other Ambulatory Visit: Payer: Medicare Other

## 2017-05-20 DIAGNOSIS — Z5181 Encounter for therapeutic drug level monitoring: Secondary | ICD-10-CM

## 2017-05-20 DIAGNOSIS — R972 Elevated prostate specific antigen [PSA]: Secondary | ICD-10-CM

## 2017-05-20 DIAGNOSIS — Z125 Encounter for screening for malignant neoplasm of prostate: Secondary | ICD-10-CM | POA: Diagnosis not present

## 2017-05-20 DIAGNOSIS — E7800 Pure hypercholesterolemia, unspecified: Secondary | ICD-10-CM | POA: Diagnosis not present

## 2017-05-20 DIAGNOSIS — E119 Type 2 diabetes mellitus without complications: Secondary | ICD-10-CM | POA: Diagnosis not present

## 2017-05-20 DIAGNOSIS — I10 Essential (primary) hypertension: Secondary | ICD-10-CM

## 2017-05-20 DIAGNOSIS — Z794 Long term (current) use of insulin: Secondary | ICD-10-CM

## 2017-05-21 ENCOUNTER — Other Ambulatory Visit: Payer: Self-pay | Admitting: Family Medicine

## 2017-05-21 LAB — COMPREHENSIVE METABOLIC PANEL
AG RATIO: 2.3 (calc) (ref 1.0–2.5)
ALKALINE PHOSPHATASE (APISO): 27 U/L — AB (ref 40–115)
ALT: 21 U/L (ref 9–46)
AST: 17 U/L (ref 10–35)
Albumin: 4.3 g/dL (ref 3.6–5.1)
BUN/Creatinine Ratio: 16 (calc) (ref 6–22)
BUN: 19 mg/dL (ref 7–25)
CHLORIDE: 103 mmol/L (ref 98–110)
CO2: 24 mmol/L (ref 20–32)
Calcium: 9.4 mg/dL (ref 8.6–10.3)
Creat: 1.2 mg/dL — ABNORMAL HIGH (ref 0.70–1.18)
Globulin: 1.9 g/dL (calc) (ref 1.9–3.7)
Glucose, Bld: 233 mg/dL — ABNORMAL HIGH (ref 65–99)
Potassium: 4 mmol/L (ref 3.5–5.3)
Sodium: 137 mmol/L (ref 135–146)
Total Bilirubin: 0.8 mg/dL (ref 0.2–1.2)
Total Protein: 6.2 g/dL (ref 6.1–8.1)

## 2017-05-21 LAB — PSA: PSA: 1.4 ng/mL (ref <=4.0)

## 2017-05-21 LAB — TSH: TSH: 1.46 mIU/L (ref 0.40–4.50)

## 2017-05-21 LAB — CBC WITH DIFFERENTIAL/PLATELET
BASOS ABS: 21 {cells}/uL (ref 0–200)
BASOS PCT: 0.5 %
EOS ABS: 111 {cells}/uL (ref 15–500)
Eosinophils Relative: 2.7 %
HCT: 40.5 % (ref 38.5–50.0)
HEMOGLOBIN: 14 g/dL (ref 13.2–17.1)
Lymphs Abs: 1107 cells/uL (ref 850–3900)
MCH: 33.2 pg — AB (ref 27.0–33.0)
MCHC: 34.6 g/dL (ref 32.0–36.0)
MCV: 96 fL (ref 80.0–100.0)
MPV: 10.8 fL (ref 7.5–12.5)
Monocytes Relative: 7.4 %
Neutro Abs: 2558 cells/uL (ref 1500–7800)
Neutrophils Relative %: 62.4 %
PLATELETS: 211 10*3/uL (ref 140–400)
RBC: 4.22 10*6/uL (ref 4.20–5.80)
RDW: 12.6 % (ref 11.0–15.0)
TOTAL LYMPHOCYTE: 27 %
WBC: 4.1 10*3/uL (ref 3.8–10.8)
WBCMIX: 303 {cells}/uL (ref 200–950)

## 2017-05-21 LAB — LIPID PANEL
Cholesterol: 130 mg/dL (ref <200)
HDL: 32 mg/dL — AB (ref >40)
LDL CHOLESTEROL (CALC): 74 mg/dL
NON-HDL CHOLESTEROL (CALC): 98 mg/dL (ref <130)
TRIGLYCERIDES: 163 mg/dL — AB (ref <150)
Total CHOL/HDL Ratio: 4.1 (calc) (ref <5.0)

## 2017-05-23 ENCOUNTER — Ambulatory Visit: Payer: Medicare Other | Admitting: Family Medicine

## 2017-06-06 ENCOUNTER — Ambulatory Visit: Payer: Medicare Other | Admitting: Family Medicine

## 2017-06-08 NOTE — Progress Notes (Signed)
Chief Complaint  Patient presents with  . Medicare Wellness    nonfasting AWV, labs already done. Would like to discuss lostartan and new findings on relation to cancer.  Also he is taking generic crestor 10mg  but has a 20mg  tablet that he is breaking in half-can we change to 10mg  so he doesn't have to cut. No other concerns. Is paying $95 for 2 sildenafil tabs would like to discuss.     CHAMP [name] is a 72 y.o. male who presents for annual wellness visit and follow-up on chronic medical conditions.  He has the following concerns:  Hypertension follow-up: Blood pressures at the pharmacyare usually 130/80. Denies dizziness, headaches, chest pain. Denies side effects of medications.  He heard today about possible recall of losartan.  Hyperlipidemia follow-up: Patient is reportedly following a low-fat, low cholesterol diet. He continues to eat red meat once a week, 1 fried egg 4x/week, oatmeal on the other days (used to eat egg daily).Compliant with taking his Crestor and Tricor, and denies medication side effects. He was concerned about the cost of the Crestor, so last year dose was doubled for him to take 1/2 tablet (to help with cost). He would like to switch back to the 10mg  so he doesn't have to cut it anymore.  ED: 1/2 tablet of Viagra is usually effective. He was switched to generic, but it is costing him $90 for 2 pills.  Asking about less expensive alternatives. Looks like he was getting 100mg  tablet (not 20mg  generic sildenafil)  OSA: Uses CPAP machine nightly. He feels better when using it. Denies any problems. Feels refreshed when he wakes up.  DM: managed by Dr. Buddy Duty. His last A1c was 8.1 in November, up from 7.7.  His metformin was stopped (not due to diarrhea, per patient, as Dr. Cindra Eves notes reported), and Ozempic was started, in addition to his 70/30 insulin. Sugars are running 120-150 in the mornings, not good about checking 2 hours after a meal. He admits to having a  "terrible" dinner (birthday party) the night before his recent bloodwork. Denies frequent hypoglycemia--felt like it was low once 2 weeks ago, took glucose tablets and felt better. He never checked his blood sugar.  He doesn't know why this happened--hadn't skipped a meal. Denies polydipsia, polyuria.Goes to the eye doctor yearly, about 6 months ago. Gets routine care at podiatrist.  Abdominal bloating had improved, and is even better since stopping metformin.  Had colonoscopy in January 2018--had tubular adenoma. Repeat in 5 years was recommended.   Immunization History  Administered Date(s) Administered  . Influenza Split 04/19/2011, 03/01/2012  . Influenza, High Dose Seasonal PF 03/07/2016  . Influenza-Unspecified 04/07/2014, 02/04/2015  . Pneumococcal Conjugate-13 01/22/2015  . Pneumococcal Polysaccharide-23 06/07/2004, 03/01/2012  . Td 06/07/2002  . Tdap 03/01/2012  . Zoster 04/18/2008  had flu shot in October at [place] colonoscopy: 06/2016 tubular adenoma, repeat in 5 years Last PSA:  Lab Results  Component Value Date   PSA 1.4 05/20/2017   PSA 1.8 06/15/2016   PSA 3.5 04/12/2016  Dentist: once yearly  Ophtho: once yearly Exercise: walks 40-45 minutes in the morning, 30 minutes in the afternoon (with the dog), 7 days/week. No longer using Bowflex machine ("it wore me out"). Plays golf.  No weight-bearing exercise.  Other doctors caring for patient include: Dr. Buddy Duty for diabetes.  Dr. [place] for ophtho  Dr. Wilfrid Lund (GI) Dr. Gayla Doss (just retired) Dr. Theodoro Kos (hasn't seen in 3 years) Dr. Eulah Pont  Depression, Fall and  functional status survey were all completely unremarkable, see Epic questionnaires.  End of Life Discussion: Patient hasa living will and medical power of attorney.   Past Medical History:  Diagnosis Date  . Adhesive capsulitis of right shoulder    s/p surgery  . Allergy   . Colon polyp 07/07/11    adenomatous and hyperplastic  . Diabetes mellitus   . Erectile dysfunction   . History of shingles mid 90's  . Hyperlipidemia elevated chol/TG  . Hypertension   . Kidney stones 8/07 [facility]  . Sleep apnea on CPAP  . Torn muscle 12/2013   ruptured gluteus medius (Dr. Mardelle Matte)    Past Surgical History:  Procedure Laterality Date  . ROTATOR CUFF REPAIR Right 2012  . TONSILLECTOMY AND ADENOIDECTOMY  1953    Social History   Socioeconomic History  . Marital status: Married    Spouse name: Not on file  . Number of children: 3  . Years of education: Not on file  . Highest education level: Not on file  Social Needs  . Financial resource strain: Not on file  . Food insecurity - worry: Not on file  . Food insecurity - inability: Not on file  . Transportation needs - medical: Not on file  . Transportation needs - non-medical: Not on file  Occupational History  . Occupation: Retired  Tobacco Use  . Smoking status: Former Smoker    Last attempt to quit: 10/01/1986    Years since quitting: 30.7  . Smokeless tobacco: Never Used  Substance and Sexual Activity  . Alcohol use: Yes    Alcohol/week: 8.4 oz    Types: 14 Standard drinks or equivalent per week    Comment: 2 drinks/day (scotch)--3 shots of alcohol/d  . Drug use: No  . Sexual activity: Yes    Partners: Female  Other Topics Concern  . Not on file  Social History Narrative   Lives with wife, 1 dog (bernadoodle puppy).  2 children in [place], 1 in [place], 6 grandchildren.  Retired from Radiation protection practitioner.  Sees grandkids most days.   Bernadoodle named Hendricks Milo (puppy 10/2016)    Family History  Problem Relation Age of Onset  . Diabetes Mother   . Hyperlipidemia Mother   . Hypertension Mother   . Alzheimer's disease Mother   . Diabetes Father   . Hypertension Father   . Hyperlipidemia Brother   . Hypertension Maternal Grandmother   . Stroke Maternal Grandmother   . Heart attack Maternal Grandfather   . Prostate cancer Paternal  Grandfather   . Hypertension Brother   . Colon cancer Neg Hx   . Stomach cancer Neg Hx     Outpatient Encounter Medications as of 06/09/2017  Medication Sig Note  . ACCU-CHEK FASTCLIX LANCETS MISC use three times a day WHEN CHECKING BLOOD SUGAR 10/10/2015: Received from: External Pharmacy  . ACCU-CHEK SMARTVIEW test strip 3 (three) times daily. for testing 10/10/2015: Received from: External Pharmacy  . amLODipine (NORVASC) 5 MG tablet TAKE 1 TABLET BY MOUTH ONCE DAILY   . aspirin 81 MG tablet Take 81 mg by mouth daily.     . B-D INS SYRINGE 0.5CC/31GX5/16 31G X 5/16" 0.5 ML MISC  12/24/2013: Received from: External Pharmacy  . BD INSULIN SYRINGE ULTRAFINE 31G X 15/64" 1 ML MISC  10/16/2014: Received from: External Pharmacy  . fenofibrate (TRICOR) 145 MG tablet take 1 tablet by mouth once daily   . losartan-hydrochlorothiazide (HYZAAR) 100-12.5 MG tablet Take 1 tablet by mouth daily.   Marland Kitchen  NOVOLIN 70/30 RELION (70-30) 100 UNIT/ML injection Inject 60 Units into the skin daily with breakfast. And 70 units with dinner 10/16/2014: Received from: External Pharmacy  . rosuvastatin (CRESTOR) 20 MG tablet Take 0.5 tablets (10 mg total) by mouth daily.   . sildenafil (VIAGRA) 100 MG tablet TAKE 1/2 TO 1 TABLET BY MOUTH IF NEEDED FOR ERECTILE DYSFUNCTION   . [DISCONTINUED] CRESTOR 10 MG tablet take 1 tablet by mouth once daily   . PAZEO 0.7 % SOLN 1 drop as needed.    . [DISCONTINUED] fenofibrate (TRICOR) 145 MG tablet Take 1 tablet (145 mg total) by mouth daily.   . [DISCONTINUED] metFORMIN (GLUCOPHAGE-XR) 500 MG 24 hr tablet Take 1,000 mg by mouth 2 (two) times daily.    Facility-Administered Encounter Medications as of 06/09/2017  Medication  . 0.9 %  sodium chloride infusion    No Known Allergies    ROS: The patient denies anorexia, fever, headaches, vision loss, decreased hearing, ear pain, hoarseness, chest pain, palpitations, dizziness, syncope, dyspnea on exertion, cough, swelling, nausea,  vomiting, constipation, melena, hematochezia, indigestion/heartburn, hematuria, incontinence, nocturia, weakened urine stream, dysuria, genital lesions, joint pains, numbness, tingling, weakness, tremor, suspicious skin lesions, depression, anxiety, abnormal bleeding/bruising, or enlarged lymph nodes. +erectile dysfunction  Occasional hesitancy.  Some frequency at 4-6pm, but sleeps through the night. No further bloating, diarrhea.   PHYSICAL EXAM:  BP 130/86   Pulse 84   Ht 5' 8.5" (1.74 m)   Wt 211 lb 3.2 oz (95.8 kg)   BMI 31.65 kg/m   Wt Readings from Last 3 Encounters:  06/09/17 211 lb 3.2 oz (95.8 kg)  10/07/16 211 lb 12.8 oz (96.1 kg)  07/28/16 209 lb (94.8 kg)    General Appearance:  Alert, cooperative, no distress, appears stated age   Head:  Normocephalic, without obvious abnormality, atraumatic   Eyes:  PERRL, conjunctiva/corneas clear, EOM's intact, fundi benign   Ears:  Normal TM's and external ear canals   Nose:  Nares normal, mucosa normal, no drainage or sinus tenderness   Throat:  Lips, mucosa, and tongue normal; teeth and gums normal   Neck:  Supple, no lymphadenopathy; thyroid: no enlargement/tenderness/ nodules; no carotid bruit or JVD   Back:  Spine nontender, no curvature, ROM normal, no CVA tenderness   Lungs:  Clear to auscultation bilaterally without wheezes, rales or ronchi; respirations unlabored   Chest Wall:  No tenderness or deformity   Heart:  Regular rate and rhythm, S1 and S2 normal, no murmur, rub  or gallop   Breast Exam:  No chest wall tenderness, masses or gynecomastia   Abdomen:  Soft, non-tender, nondistended, normoactive bowel sounds, no masses, no hepatosplenomegaly   Genitalia:  Normal male external genitalia without lesions. Testicles without masses. No inguinal hernias.   Rectal:  Normal sphincter tone, no masses or tenderness; guaiac negative stool. Small noninflamed external hemorrhoidal tag. Prostate  smooth, no nodules, mild-mod enlarged. No nodule/mass.   Extremities:  No clubbing, cyanosis or edema   Pulses:  2+ and symmetric all extremities   Skin:  Skin color, texture, turgor normal, no rashes. Dry, hyperkeratotic patches on bilateral LE's. There is a 23mm flesh colored nodule on right foot, medial heel area, unchanged  Lymph nodes:  Cervical, supraclavicular, and axillary nodes normal   Neurologic:  CNII-XII intact, normal strength, sensation and gait; reflexes 2+ and symmetric throughout    Psych: Normal mood, affect, hygiene and grooming.       Diabetic foot exam was performed: Thickened/discolored  R toenails (1, 2, 3, 5 on the right, discolored at 1 and 5 (only slightly) on the left. Remainder of foot exam is normal. There is 33mm flesh colored nodule on right foot, medial heel area, unchanged from last year.      Chemistry      Component Value Date/Time   NA 137 05/20/2017 0821   K 4.0 05/20/2017 0821   CL 103 05/20/2017 0821   CO2 24 05/20/2017 0821   BUN 19 05/20/2017 0821   CREATININE 1.20 (H) 05/20/2017 0821      Component Value Date/Time   CALCIUM 9.4 05/20/2017 0821   ALKPHOS 28 (L) 10/05/2016 0731   AST 17 05/20/2017 0821   ALT 21 05/20/2017 0821   BILITOT 0.8 05/20/2017 0821     Fasting glu 233  Lab Results  Component Value Date   WBC 4.1 05/20/2017   HGB 14.0 05/20/2017   HCT 40.5 05/20/2017   MCV 96.0 05/20/2017   PLT 211 05/20/2017   Lab Results  Component Value Date   CHOL 130 05/20/2017   HDL 32 (L) 05/20/2017   LDLCALC 54 10/05/2016   TRIG 163 (H) 05/20/2017   CHOLHDL 4.1 05/20/2017   Lab Results  Component Value Date   TSH 1.46 05/20/2017   Lab Results  Component Value Date   PSA 1.4 05/20/2017   PSA 1.8 06/15/2016   PSA 3.5 04/12/2016     ASSESSMENT/PLAN:  Medicare annual wellness visit, subsequent  Medication monitoring encounter  Screening for prostate cancer  OSA on  CPAP  Erectile dysfunction, unspecified erectile dysfunction type - will check into prices of 20mg  sildenafil and let us know where and quantity to call in ([facility], [facility], or elsewhere)  Type 2 diabetes mellitus with hyperglycemia, with long-term current use of insulin ([place]) - under care of Dr. Buddy Duty  Essential hypertension, benign - borderline here, better at home; encouraged weight loss, low sodium diet; cont to monitor elsewhere - Plan: losartan-hydrochlorothiazide (HYZAAR) 100-12.5 MG tablet  Pure hypercholesterolemia - TG milly elevated (along with high sugar)--diet reviewed. Continue current medications. LDL at goal. (change to 10mg  so doesn't have to split tabs) - Plan: rosuvastatin (CRESTOR) 10 MG tablet  Class 1 obesity with serious comorbidity and body mass index (BMI) of 31.0 to 31.9 in adult, unspecified obesity type - encouraged weight loss--diet, portion control; continue daily exercise, add weight-bearing.   Recommended at least 30 minutes of aerobic activity at least 5 days/week, weight-bearing exercise 2x/week; proper sunscreen use reviewed; healthy diet and alcohol recommendations (less than or equal to 2 drinks/day) reviewed; regular seatbelt use; changing batteries in smoke detectors. Self-testicular exams. Immunization recommendations discussed, UTD; continue high dose flu shots; Shingrix recommended and side effects reviewed. Colonoscopy recommendations reviewed--due again 06/2021.   SEND COPIES OF LABS TO DR Buddy Duty  Check on the costs of generic sildenafil (20mg )--lower dose, may require up to 40-100mg  to be effective for treating erectile dysfunction.  [facility] and [facility] may have special deals, you need to find out which quantity is the most cost-effective and let me know so I can send in the prescription.   f/u with dermatologist recommended   Medicare Attestation I have personally reviewed: The patient's medical and social history Their use of alcohol, tobacco or  illicit drugs Their current medications and supplements The patient's functional ability including ADLs,fall risks, home safety risks, cognitive, and hearing and visual impairment Diet and physical activities Evidence for depression or mood disorders  The patient's weight, height, and  BMI have been recorded in the chart.  I have made referrals, counseling, and provided education to the patient based on review of the above and I have provided the patient with a written personalized care plan for preventive services.

## 2017-06-09 ENCOUNTER — Ambulatory Visit (INDEPENDENT_AMBULATORY_CARE_PROVIDER_SITE_OTHER): Payer: Medicare Other | Admitting: Family Medicine

## 2017-06-09 ENCOUNTER — Encounter: Payer: Self-pay | Admitting: Family Medicine

## 2017-06-09 VITALS — BP 130/86 | HR 84 | Ht 68.5 in | Wt 211.2 lb

## 2017-06-09 DIAGNOSIS — Z0000 Encounter for general adult medical examination without abnormal findings: Secondary | ICD-10-CM | POA: Diagnosis not present

## 2017-06-09 DIAGNOSIS — Z5181 Encounter for therapeutic drug level monitoring: Secondary | ICD-10-CM | POA: Diagnosis not present

## 2017-06-09 DIAGNOSIS — E669 Obesity, unspecified: Secondary | ICD-10-CM | POA: Diagnosis not present

## 2017-06-09 DIAGNOSIS — N529 Male erectile dysfunction, unspecified: Secondary | ICD-10-CM | POA: Diagnosis not present

## 2017-06-09 DIAGNOSIS — I10 Essential (primary) hypertension: Secondary | ICD-10-CM

## 2017-06-09 DIAGNOSIS — Z794 Long term (current) use of insulin: Secondary | ICD-10-CM

## 2017-06-09 DIAGNOSIS — Z6831 Body mass index (BMI) 31.0-31.9, adult: Secondary | ICD-10-CM | POA: Diagnosis not present

## 2017-06-09 DIAGNOSIS — E1165 Type 2 diabetes mellitus with hyperglycemia: Secondary | ICD-10-CM | POA: Diagnosis not present

## 2017-06-09 DIAGNOSIS — G4733 Obstructive sleep apnea (adult) (pediatric): Secondary | ICD-10-CM

## 2017-06-09 DIAGNOSIS — Z125 Encounter for screening for malignant neoplasm of prostate: Secondary | ICD-10-CM

## 2017-06-09 DIAGNOSIS — Z9989 Dependence on other enabling machines and devices: Secondary | ICD-10-CM | POA: Diagnosis not present

## 2017-06-09 DIAGNOSIS — E7800 Pure hypercholesterolemia, unspecified: Secondary | ICD-10-CM

## 2017-06-09 DIAGNOSIS — E66811 Obesity, class 1: Secondary | ICD-10-CM

## 2017-06-09 MED ORDER — LOSARTAN POTASSIUM-HCTZ 100-12.5 MG PO TABS: 1.0000 | ORAL_TABLET | Freq: Every day | ORAL | 1 refills | Status: DC

## 2017-06-09 MED ORDER — ROSUVASTATIN CALCIUM 10 MG PO TABS: 10.0000 mg | ORAL_TABLET | Freq: Every day | ORAL | 1 refills | Status: DC

## 2017-06-09 NOTE — Patient Instructions (Addendum)
  HEALTH MAINTENANCE RECOMMENDATIONS:  It is recommended that you get at least 30 minutes of aerobic exercise at least 5 days/week (for weight loss, you may need as much as 60-90 minutes). This can be any activity that gets your heart rate up. This can be divided in 10-15 minute intervals if needed, but try and build up your endurance at least once a week.  Weight bearing exercise is also recommended twice weekly.  Eat a healthy diet with lots of vegetables, fruits and fiber.  "Colorful" foods have a lot of vitamins (ie green vegetables, tomatoes, red peppers, etc).  Limit sweet tea, regular sodas and alcoholic beverages, all of which has a lot of calories and sugar.  Up to 2 alcoholic drinks daily may be beneficial for men (unless trying to lose weight, watch sugars).  Drink a lot of water.  Sunscreen of at least SPF 30 should be used on all sun-exposed parts of the skin when outside between the hours of 10 am and 4 pm (not just when at beach or pool, but even with exercise, golf, tennis, and yard work!)  Use a sunscreen that says "broad spectrum" so it covers both UVA and UVB rays, and make sure to reapply every 1-2 hours.  Remember to change the batteries in your smoke detectors when changing your clock times in the spring and fall.  Use your seat belt every time you are in a car, and please drive safely and not be distracted with cell phones and texting while driving.   [name] , Thank you for taking time to come for your Medicare Wellness Visit. I appreciate your ongoing commitment to your health goals. Please review the following plan we discussed and let me know if I can assist you in the future.   These are the goals we discussed: Goals    None      This is a list of the screening recommended for you and due dates:  Health Maintenance  Topic Date Due  . Eye exam for diabetics  03/16/2016  . Flu Shot  01/05/2017  . Complete foot exam   04/14/2017  . Hemoglobin A1C  10/11/2017    . Colon Cancer Screening  07/07/2021  . Tetanus Vaccine  03/01/2022  .  Hepatitis C: One time screening is recommended by Center for Disease Control  (CDC) for  adults born from [birth year] through 1964.   Completed  . Pneumonia vaccines  Completed   Please have the eye doctor send their reports to [place], as well as to Dr. Buddy Duty. Continue yearly diabetic eye exam. Continue yearly high dose flu shots. Foot exam was done today.  I recommend getting the new shingles vaccine (Shingrix). You will need to check with your insurance to see if it is covered, and if covered by Medicare Part D, you need to get from the pharmacy rather than our office.  It is a series of 2 injections, spaced 2 months apart.   Check on the costs of generic sildenafil (20mg )--lower dose, may require up to 40-100mg  to be effective for treating erectile dysfunction.  [facility] and [facility] may have special deals, you need to find out which quantity is the most cost-effective and let me know so I can send in the prescription.  I recommend you see the dermatologist this year.

## 2017-06-16 ENCOUNTER — Other Ambulatory Visit: Payer: Self-pay | Admitting: Family Medicine

## 2017-06-22 ENCOUNTER — Encounter: Payer: Self-pay | Admitting: Podiatry

## 2017-06-22 ENCOUNTER — Ambulatory Visit (INDEPENDENT_AMBULATORY_CARE_PROVIDER_SITE_OTHER): Payer: Medicare Other | Admitting: Podiatry

## 2017-06-22 DIAGNOSIS — E119 Type 2 diabetes mellitus without complications: Secondary | ICD-10-CM | POA: Diagnosis not present

## 2017-06-22 DIAGNOSIS — M79676 Pain in unspecified toe(s): Secondary | ICD-10-CM

## 2017-06-22 DIAGNOSIS — B351 Tinea unguium: Secondary | ICD-10-CM

## 2017-06-22 NOTE — Progress Notes (Signed)
Patient ID: ORIAN FIGUEIRA, male   DOB: [birth year], 72 y.o.   MRN: [id number] Complaint:  Visit Type: Patient returns to my office for continued preventative foot care services. Complaint: Patient states" my nails have grown long and thick and become painful to walk and wear shoes" Patient has been diagnosed with DM with no foot complications. The patient presents for preventative foot care services. No changes to ROS  Podiatric Exam: Vascular: dorsalis pedis and posterior tibial pulses are palpable bilateral. Capillary return is immediate. Temperature gradient is WNL. Skin turgor WNL  Sensorium: Normal Semmes Weinstein monofilament test. Normal tactile sensation bilaterally. Nail Exam: Pt has thick disfigured discolored nails with subungual debris noted bilateral entire nail hallux through fifth toenails Ulcer Exam: There is no evidence of ulcer or pre-ulcerative changes or infection. Orthopedic Exam: Muscle tone and strength are WNL. No limitations in general ROM. No crepitus or effusions noted. Foot type and digits show no abnormalities. Bony prominences are unremarkable. Skin: No Porokeratosis. No infection or ulcers  Diagnosis:  Onychomycosis, , Pain in right toe, pain in left toes  Treatment & Plan Procedures and Treatment: Consent by patient was obtained for treatment procedures. The patient understood the discussion of treatment and procedures well. All questions were answered thoroughly reviewed. Debridement of mycotic and hypertrophic toenails, 1 through 5 bilateral and clearing of subungual debris. No ulceration, no infection noted.  ABN signed for 2018. Return Visit-Office Procedure: Patient instructed to return to the office for a follow up visit 9 weeks  for continued evaluation and treatment.  Gardiner Barefoot DPM

## 2017-07-29 DIAGNOSIS — E669 Obesity, unspecified: Secondary | ICD-10-CM | POA: Diagnosis not present

## 2017-07-29 DIAGNOSIS — E1165 Type 2 diabetes mellitus with hyperglycemia: Secondary | ICD-10-CM | POA: Diagnosis not present

## 2017-07-29 DIAGNOSIS — Z794 Long term (current) use of insulin: Secondary | ICD-10-CM | POA: Diagnosis not present

## 2017-07-29 LAB — HEMOGLOBIN A1C: Hemoglobin A1C: 7.1

## 2017-08-04 DIAGNOSIS — H6123 Impacted cerumen, bilateral: Secondary | ICD-10-CM | POA: Diagnosis not present

## 2017-08-09 ENCOUNTER — Encounter: Payer: Self-pay | Admitting: *Deleted

## 2017-08-22 ENCOUNTER — Other Ambulatory Visit: Payer: Self-pay | Admitting: Family Medicine

## 2017-08-24 ENCOUNTER — Encounter: Payer: Self-pay | Admitting: Podiatry

## 2017-08-24 ENCOUNTER — Ambulatory Visit (INDEPENDENT_AMBULATORY_CARE_PROVIDER_SITE_OTHER): Payer: Medicare Other | Admitting: Podiatry

## 2017-08-24 DIAGNOSIS — B351 Tinea unguium: Secondary | ICD-10-CM | POA: Diagnosis not present

## 2017-08-24 DIAGNOSIS — E119 Type 2 diabetes mellitus without complications: Secondary | ICD-10-CM

## 2017-08-24 DIAGNOSIS — M79676 Pain in unspecified toe(s): Secondary | ICD-10-CM

## 2017-08-24 NOTE — Progress Notes (Signed)
Patient ID: [name], male   DOB: Oct 06, 1945, 72 y.o.   MRN: [id number] Complaint:  Visit Type: Patient returns to my office for continued preventative foot care services. Complaint: Patient states" my nails have grown long and thick and become painful to walk and wear shoes" Patient has been diagnosed with DM with no foot complications. The patient presents for preventative foot care services. No changes to ROS  Podiatric Exam: Vascular: dorsalis pedis and posterior tibial pulses are palpable bilateral. Capillary return is immediate. Temperature gradient is WNL. Skin turgor WNL  Sensorium: Normal Semmes Weinstein monofilament test. Normal tactile sensation bilaterally. Nail Exam: Pt has thick disfigured discolored nails with subungual debris noted bilateral entire nail hallux through fifth toenails Ulcer Exam: There is no evidence of ulcer or pre-ulcerative changes or infection. Orthopedic Exam: Muscle tone and strength are WNL. No limitations in general ROM. No crepitus or effusions noted. Foot type and digits show no abnormalities. Bony prominences are unremarkable. Skin: No Porokeratosis. No infection or ulcers  Diagnosis:  Onychomycosis, , Pain in right toe, pain in left toes  Treatment & Plan Procedures and Treatment: Consent by patient was obtained for treatment procedures. The patient understood the discussion of treatment and procedures well. All questions were answered thoroughly reviewed. Debridement of mycotic and hypertrophic toenails, 1 through 5 bilateral and clearing of subungual debris. No ulceration, no infection noted.  ABN signed for 2019. Return Visit-Office Procedure: Patient instructed to return to the office for a follow up visit 9 weeks  for continued evaluation and treatment.  Gardiner Barefoot DPM

## 2017-10-26 ENCOUNTER — Encounter: Payer: Self-pay | Admitting: Podiatry

## 2017-10-26 ENCOUNTER — Ambulatory Visit (INDEPENDENT_AMBULATORY_CARE_PROVIDER_SITE_OTHER): Payer: Medicare Other | Admitting: Podiatry

## 2017-10-26 DIAGNOSIS — M79676 Pain in unspecified toe(s): Secondary | ICD-10-CM

## 2017-10-26 DIAGNOSIS — B351 Tinea unguium: Secondary | ICD-10-CM | POA: Diagnosis not present

## 2017-10-26 DIAGNOSIS — E119 Type 2 diabetes mellitus without complications: Secondary | ICD-10-CM

## 2017-10-26 NOTE — Progress Notes (Signed)
Patient ID: [name], male   DOB: 08/02/1945, 72 y.o.   MRN: [id number] Complaint:  Visit Type: Patient returns to my office for continued preventative foot care services. Complaint: Patient states" my nails have grown long and thick and become painful to walk and wear shoes" Patient has been diagnosed with DM with no foot complications. The patient presents for preventative foot care services. No changes to ROS  Podiatric Exam: Vascular: dorsalis pedis and posterior tibial pulses are palpable bilateral. Capillary return is immediate. Temperature gradient is WNL. Skin turgor WNL  Sensorium: Normal Semmes Weinstein monofilament test. Normal tactile sensation bilaterally. Nail Exam: Pt has thick disfigured discolored nails with subungual debris noted bilateral entire nail hallux through fifth toenails Ulcer Exam: There is no evidence of ulcer or pre-ulcerative changes or infection. Orthopedic Exam: Muscle tone and strength are WNL. No limitations in general ROM. No crepitus or effusions noted. Foot type and digits show no abnormalities. Bony prominences are unremarkable. Skin: No Porokeratosis. No infection or ulcers  Diagnosis:  Onychomycosis, , Pain in right toe, pain in left toes  Treatment & Plan Procedures and Treatment: Consent by patient was obtained for treatment procedures. The patient understood the discussion of treatment and procedures well. All questions were answered thoroughly reviewed. Debridement of mycotic and hypertrophic toenails, 1 through 5 bilateral and clearing of subungual debris. No ulceration, no infection noted.  ABN signed for 2019. Return Visit-Office Procedure: Patient instructed to return to the office for a follow up visit 9 weeks  for continued evaluation and treatment.  Shanon Becvar DPM 

## 2017-11-11 ENCOUNTER — Other Ambulatory Visit: Payer: Self-pay | Admitting: Family Medicine

## 2017-11-11 NOTE — Telephone Encounter (Signed)
Has an appt coming up in july

## 2017-11-28 DIAGNOSIS — H2513 Age-related nuclear cataract, bilateral: Secondary | ICD-10-CM | POA: Diagnosis not present

## 2017-11-28 DIAGNOSIS — H43811 Vitreous degeneration, right eye: Secondary | ICD-10-CM | POA: Diagnosis not present

## 2017-11-28 DIAGNOSIS — D3131 Benign neoplasm of right choroid: Secondary | ICD-10-CM | POA: Diagnosis not present

## 2017-11-28 DIAGNOSIS — E109 Type 1 diabetes mellitus without complications: Secondary | ICD-10-CM | POA: Diagnosis not present

## 2017-11-28 LAB — HM DIABETES EYE EXAM

## 2017-12-11 NOTE — Progress Notes (Signed)
Chief Complaint  Patient presents with  . Hypertension    patient is here for med check, not told to fast for today. Had diabetic eye exam 2 weeks ago with Dr. [place] (I will get). No concerns.    Hypertension follow-up: Blood pressures are no longer regularly checked, but okay at other doctor visits.Denies dizziness, headaches, chest pain. He reports he stopped taking losartan HCT about 4-5 months ago, due to concern about recall.  He had been taking losartan HCT 100/12.5 prior to stopping a few months ago. He has been taking his amlodipine daily.   Hyperlipidemia follow-up: Patient is reportedly following a low-fat, low cholesterol diet. His diet hasn't changed.Compliant with taking his Crestor and Tricor, and denies medication side effects.  Lab Results  Component Value Date   CHOL 130 05/20/2017   HDL 32 (L) 05/20/2017   LDLCALC 74 05/20/2017   TRIG 163 (H) 05/20/2017   CHOLHDL 4.1 05/20/2017    ED: 1/2 tablet of Viagra is usually effective. He has been getting [facility]m [facility]  OSA: Uses CPAP machine nightly. He feels better when using it. Denies any problems. Feels refreshed when he wakes up.  DM: managed by Dr. KBuddy Duty His last A1c was 7.1 in February. He continues on Ozempic and 70/30 insulin.  Denies hypoglycemia. Denies polydipsia, polyuria.Gets routine care at podiatrist. He had his eye exam a few weeks ago.  Exercise: walks 55 minutes 6 days/week  PMH, PSH, SH reviewed  Outpatient Encounter Medications as of 12/12/2017  Medication Sig Note  . ACCU-CHEK FASTCLIX LANCETS MISC use three times a day WHEN CHECKING BLOOD SUGAR 10/10/2015: Received from: External Pharmacy  . ACCU-CHEK SMARTVIEW test strip 3 (three) times daily. for testing 10/10/2015: Received from: External Pharmacy  . amLODipine (NORVASC) 5 MG tablet TAKE 1 TABLET BY MOUTH ONCE DAILY   . aspirin 81 MG tablet Take 81 mg by mouth daily.     . B-D INS SYRINGE 0.5CC/31GX5/16 31G X 5/16" 0.5 ML MISC   12/24/2013: Received from: External Pharmacy  . BD INSULIN SYRINGE ULTRAFINE 31G X 15/64" 1 ML MISC  10/16/2014: Received from: External Pharmacy  . fenofibrate (TRICOR) 145 MG tablet take 1 tablet by mouth once daily   . NOVOLIN 70/30 RELION (70-30) 100 UNIT/ML injection Inject 60 Units into the skin daily with breakfast. And 70 units with dinner 10/16/2014: Received from: External Pharmacy  . PAZEO 0.7 % SOLN 1 drop as needed.    . rosuvastatin (CRESTOR) 10 MG tablet Take 1 tablet (10 mg total) by mouth daily.   . sildenafil (VIAGRA) 100 MG tablet TAKE 1/2 TO 1 TABLET BY MOUTH IF NEEDED FOR ERECTILE DYSFUNCTION   . losartan-hydrochlorothiazide (HYZAAR) 100-12.5 MG tablet Take 1 tablet by mouth daily. (Patient not taking: Reported on 12/12/2017) 12/12/2017: Stopped 4-5 months ago (concern about recall)  . [DISCONTINUED] fenofibrate (TRICOR) 145 MG tablet TAKE 1 TABLET(145 MG) BY MOUTH DAILY    Facility-Administered Encounter Medications as of 12/12/2017  Medication  . 0.9 %  sodium chloride infusion   No Known Allergies   ROS:  No fever, chills, weight changes, headaches, dizziness, hypoglycemia, polydipsia, polyuria, vision/hearing complaints, chest pain, palpitations, shortness of breath, URI symptoms, GI or GU complaints, skin concerns, mood changes or any other concerns.   PHYSICAL EXAM:  BP 128/70   Pulse 76   Ht 5' 8"  (1.727 m)   Wt 212 lb 12.8 oz (96.5 kg)   BMI 32.36 kg/m    Well developed, pleasant, overweight  male in no distress, in good spirits HEENT: PERRL, EOMI, conjunctiva and sclera are clear.  OP clear Neck: no lymphadenopathy, thyromegaly or carotid bruit Heart: regular rate and rhythm Lungs: clear bilaterally Back: no CVA tenderness, SI or spinal tenderness Abdomen: soft, nontender, no organomegaly or mass Extremities: no edema, normal pulses.  Psych: normal mood, affect, hygiene and grooming Neuro: alert and oriented, cranial nerves intact, normal gait Skin: no  visible rashes, normal turgor   ASSESSMENT/PLAN:  Essential hypertension, benign - BP normal today, despite not taking losartan HCT. No edema. Therefore, start 67m losartan back (to protect kidneys) along with amlodipine - Plan: Comprehensive metabolic panel, losartan (COZAAR) 50 MG tablet  Type 2 diabetes mellitus with hyperglycemia, with long-term current use of insulin (HCC) - monitored per Dr. KBuddy Duty controlled - Plan: Comprehensive metabolic panel, Microalbumin / creatinine urine ratio, losartan (COZAAR) 50 MG tablet  Pure hypercholesterolemia - discussed prior borderline results, and proper diet. To return fasting for labs - Plan: Lipid panel  Medication monitoring encounter - Plan: Lipid panel, Comprehensive metabolic panel   c-met, lipids (will return for fasting labs) [facility] TO DR KBuddy Duty F/u in 6 mos AWV/med check  Need ophtho records/diabetic eye exam.    I recommend restarting losartan as it helps protect your kidneys. I do not think you need the same dose as before, or the diuretic, so I'm lowering the dose to 517m Take it once daily.  If able, periodically check your blood pressure to ensure that you are at goal. Goal blood pressure is <130/80. If you feel light headed, dizzy, faint, and your blood pressure is <110/60, the dose can be lowered.  If your blood pressure is low, but zero symptoms, okay to continue (I wouldn't continue if <1<803ystolic)

## 2017-12-12 ENCOUNTER — Other Ambulatory Visit: Payer: Self-pay | Admitting: *Deleted

## 2017-12-12 ENCOUNTER — Ambulatory Visit (INDEPENDENT_AMBULATORY_CARE_PROVIDER_SITE_OTHER): Payer: Medicare Other | Admitting: Family Medicine

## 2017-12-12 ENCOUNTER — Telehealth: Payer: Self-pay | Admitting: Family Medicine

## 2017-12-12 ENCOUNTER — Encounter: Payer: Self-pay | Admitting: Family Medicine

## 2017-12-12 VITALS — BP 128/70 | HR 76 | Ht 68.0 in | Wt 212.8 lb

## 2017-12-12 DIAGNOSIS — E7800 Pure hypercholesterolemia, unspecified: Secondary | ICD-10-CM | POA: Diagnosis not present

## 2017-12-12 DIAGNOSIS — I10 Essential (primary) hypertension: Secondary | ICD-10-CM | POA: Diagnosis not present

## 2017-12-12 DIAGNOSIS — Z5181 Encounter for therapeutic drug level monitoring: Secondary | ICD-10-CM

## 2017-12-12 DIAGNOSIS — E1165 Type 2 diabetes mellitus with hyperglycemia: Secondary | ICD-10-CM

## 2017-12-12 DIAGNOSIS — Z794 Long term (current) use of insulin: Secondary | ICD-10-CM

## 2017-12-12 MED ORDER — LOSARTAN POTASSIUM 50 MG PO TABS
50.0000 mg | ORAL_TABLET | Freq: Every day | ORAL | 1 refills | Status: DC
Start: 2017-12-12 — End: 2018-05-22

## 2017-12-12 MED ORDER — SILDENAFIL CITRATE 20 MG PO TABS: 20.0000 mg | ORAL_TABLET | Freq: Three times a day (TID) | ORAL | 0 refills | Status: DC

## 2017-12-12 NOTE — Telephone Encounter (Signed)
Received requested eye exam from [facility], sending back for review.

## 2017-12-12 NOTE — Patient Instructions (Signed)
  I recommend restarting losartan as it helps protect your kidneys. I do not think you need the same dose as before, or the diuretic, so I'm lowering the dose to 50mg . Take it once daily.  If able, periodically check your blood pressure to ensure that you are at goal. Goal blood pressure is <130/80. If you feel light headed, dizzy, faint, and your blood pressure is <110/60, the dose can be lowered.  If your blood pressure is low, but zero symptoms, okay to continue (I wouldn't continue if <237 systolic)

## 2017-12-13 LAB — MICROALBUMIN / CREATININE URINE RATIO
CREATININE, UR: 77.3 mg/dL
MICROALB/CREAT RATIO: 38 mg/g{creat} — AB (ref 0.0–30.0)
MICROALBUM., U, RANDOM: 29.4 ug/mL

## 2017-12-17 ENCOUNTER — Other Ambulatory Visit: Payer: Self-pay | Admitting: Family Medicine

## 2017-12-17 DIAGNOSIS — E7800 Pure hypercholesterolemia, unspecified: Secondary | ICD-10-CM

## 2017-12-26 ENCOUNTER — Other Ambulatory Visit: Payer: Medicare Other

## 2017-12-26 DIAGNOSIS — E7800 Pure hypercholesterolemia, unspecified: Secondary | ICD-10-CM

## 2017-12-26 DIAGNOSIS — I10 Essential (primary) hypertension: Secondary | ICD-10-CM | POA: Diagnosis not present

## 2017-12-26 DIAGNOSIS — Z5181 Encounter for therapeutic drug level monitoring: Secondary | ICD-10-CM | POA: Diagnosis not present

## 2017-12-26 DIAGNOSIS — E1165 Type 2 diabetes mellitus with hyperglycemia: Secondary | ICD-10-CM

## 2017-12-26 DIAGNOSIS — Z794 Long term (current) use of insulin: Secondary | ICD-10-CM | POA: Diagnosis not present

## 2017-12-26 LAB — COMPREHENSIVE METABOLIC PANEL
ALT: 23 IU/L (ref 0–44)
AST: 19 IU/L (ref 0–40)
Albumin/Globulin Ratio: 2.6 — ABNORMAL HIGH (ref 1.2–2.2)
Albumin: 4.7 g/dL (ref 3.5–4.8)
Alkaline Phosphatase: 41 IU/L (ref 39–117)
BILIRUBIN TOTAL: 0.7 mg/dL (ref 0.0–1.2)
BUN/Creatinine Ratio: 18 (ref 10–24)
BUN: 24 mg/dL (ref 8–27)
CHLORIDE: 102 mmol/L (ref 96–106)
CO2: 24 mmol/L (ref 20–29)
Calcium: 9.7 mg/dL (ref 8.6–10.2)
Creatinine, Ser: 1.36 mg/dL — ABNORMAL HIGH (ref 0.76–1.27)
GFR calc Af Amer: 60 mL/min/{1.73_m2} (ref >59)
GFR calc non Af Amer: 52 mL/min/{1.73_m2} — ABNORMAL LOW (ref >59)
GLUCOSE: 145 mg/dL — AB (ref 65–99)
Globulin, Total: 1.8 g/dL (ref 1.5–4.5)
POTASSIUM: 4.6 mmol/L (ref 3.5–5.2)
Sodium: 140 mmol/L (ref 134–144)
TOTAL PROTEIN: 6.5 g/dL (ref 6.0–8.5)

## 2017-12-26 LAB — MICROALBUMIN, URINE: Microalb, Ur: 38

## 2017-12-26 LAB — LIPID PANEL
CHOL/HDL RATIO: 4.2 ratio (ref 0.0–5.0)
Cholesterol, Total: 142 mg/dL (ref 100–199)
HDL: 34 mg/dL — AB (ref >39)
LDL CALC: 80 mg/dL (ref 0–99)
Triglycerides: 140 mg/dL (ref 0–149)
VLDL Cholesterol Cal: 28 mg/dL (ref 5–40)

## 2017-12-27 ENCOUNTER — Encounter: Payer: Self-pay | Admitting: Family Medicine

## 2017-12-28 ENCOUNTER — Encounter: Payer: Self-pay | Admitting: Podiatry

## 2017-12-28 ENCOUNTER — Ambulatory Visit (INDEPENDENT_AMBULATORY_CARE_PROVIDER_SITE_OTHER): Payer: Medicare Other | Admitting: Podiatry

## 2017-12-28 DIAGNOSIS — M79676 Pain in unspecified toe(s): Secondary | ICD-10-CM | POA: Diagnosis not present

## 2017-12-28 DIAGNOSIS — B351 Tinea unguium: Secondary | ICD-10-CM

## 2017-12-28 DIAGNOSIS — E119 Type 2 diabetes mellitus without complications: Secondary | ICD-10-CM

## 2017-12-28 NOTE — Progress Notes (Signed)
Patient ID: [name], male   DOB: 11/02/1945, 72 y.o.   MRN: [id number] Complaint:  Visit Type: Patient returns to my office for continued preventative foot care services. Complaint: Patient states" my nails have grown long and thick and become painful to walk and wear shoes" Patient has been diagnosed with DM with no foot complications. The patient presents for preventative foot care services. No changes to ROS  Podiatric Exam: Vascular: dorsalis pedis and posterior tibial pulses are palpable bilateral. Capillary return is immediate. Temperature gradient is WNL. Skin turgor WNL  Sensorium: Normal Semmes Weinstein monofilament test. Normal tactile sensation bilaterally. Nail Exam: Pt has thick disfigured discolored nails with subungual debris noted bilateral entire nail hallux through fifth toenails Ulcer Exam: There is no evidence of ulcer or pre-ulcerative changes or infection. Orthopedic Exam: Muscle tone and strength are WNL. No limitations in general ROM. No crepitus or effusions noted. Foot type and digits show no abnormalities. Bony prominences are unremarkable. Skin: No Porokeratosis. No infection or ulcers  Diagnosis:  Onychomycosis, , Pain in right toe, pain in left toes  Treatment & Plan Procedures and Treatment: Consent by patient was obtained for treatment procedures. The patient understood the discussion of treatment and procedures well. All questions were answered thoroughly reviewed. Debridement of mycotic and hypertrophic toenails, 1 through 5 bilateral and clearing of subungual debris. No ulceration, no infection noted.  ABN signed for 2019. Return Visit-Office Procedure: Patient instructed to return to the office for a follow up visit 9 weeks  for continued evaluation and treatment.  Elwood Bazinet DPM 

## 2018-01-04 ENCOUNTER — Other Ambulatory Visit: Payer: Self-pay | Admitting: Family Medicine

## 2018-01-27 DIAGNOSIS — E669 Obesity, unspecified: Secondary | ICD-10-CM | POA: Diagnosis not present

## 2018-01-27 DIAGNOSIS — Z794 Long term (current) use of insulin: Secondary | ICD-10-CM | POA: Diagnosis not present

## 2018-01-27 DIAGNOSIS — E1165 Type 2 diabetes mellitus with hyperglycemia: Secondary | ICD-10-CM | POA: Diagnosis not present

## 2018-01-27 DIAGNOSIS — Z6831 Body mass index (BMI) 31.0-31.9, adult: Secondary | ICD-10-CM | POA: Diagnosis not present

## 2018-01-31 ENCOUNTER — Other Ambulatory Visit: Payer: Self-pay | Admitting: Family Medicine

## 2018-02-28 ENCOUNTER — Other Ambulatory Visit: Payer: Self-pay | Admitting: Family Medicine

## 2018-02-28 ENCOUNTER — Encounter: Payer: Self-pay | Admitting: Podiatry

## 2018-02-28 ENCOUNTER — Ambulatory Visit (INDEPENDENT_AMBULATORY_CARE_PROVIDER_SITE_OTHER): Payer: Medicare Other | Admitting: Podiatry

## 2018-02-28 DIAGNOSIS — Z23 Encounter for immunization: Secondary | ICD-10-CM | POA: Diagnosis not present

## 2018-02-28 DIAGNOSIS — E119 Type 2 diabetes mellitus without complications: Secondary | ICD-10-CM

## 2018-02-28 DIAGNOSIS — M79676 Pain in unspecified toe(s): Secondary | ICD-10-CM | POA: Diagnosis not present

## 2018-02-28 DIAGNOSIS — B351 Tinea unguium: Secondary | ICD-10-CM | POA: Diagnosis not present

## 2018-02-28 NOTE — Progress Notes (Signed)
Patient ID: [name], male   DOB: 03/24/1946, 72 y.o.   MRN: [id number] Complaint:  Visit Type: Patient returns to my office for continued preventative foot care services. Complaint: Patient states" my nails have grown long and thick and become painful to walk and wear shoes" Patient has been diagnosed with DM with no foot complications. The patient presents for preventative foot care services. No changes to ROS  Podiatric Exam: Vascular: dorsalis pedis and posterior tibial pulses are palpable bilateral. Capillary return is immediate. Temperature gradient is WNL. Skin turgor WNL  Sensorium: Normal Semmes Weinstein monofilament test. Normal tactile sensation bilaterally. Nail Exam: Pt has thick disfigured discolored nails with subungual debris noted bilateral entire nail hallux through fifth toenails Ulcer Exam: There is no evidence of ulcer or pre-ulcerative changes or infection. Orthopedic Exam: Muscle tone and strength are WNL. No limitations in general ROM. No crepitus or effusions noted. Foot type and digits show no abnormalities. Bony prominences are unremarkable. Skin: No Porokeratosis. No infection or ulcers  Diagnosis:  Onychomycosis, , Pain in right toe, pain in left toes  Treatment & Plan Procedures and Treatment: Consent by patient was obtained for treatment procedures. The patient understood the discussion of treatment and procedures well. All questions were answered thoroughly reviewed. Debridement of mycotic and hypertrophic toenails, 1 through 5 bilateral and clearing of subungual debris. No ulceration, no infection noted.  ABN signed for 2019. Return Visit-Office Procedure: Patient instructed to return to the office for a follow up visit 9 weeks  for continued evaluation and treatment.  Mikahla Wisor DPM 

## 2018-02-28 NOTE — Telephone Encounter (Signed)
Pt has an appt in February 2020

## 2018-03-02 ENCOUNTER — Other Ambulatory Visit: Payer: Self-pay | Admitting: Family Medicine

## 2018-03-02 DIAGNOSIS — E7800 Pure hypercholesterolemia, unspecified: Secondary | ICD-10-CM

## 2018-03-31 ENCOUNTER — Other Ambulatory Visit: Payer: Self-pay | Admitting: Internal Medicine

## 2018-03-31 ENCOUNTER — Ambulatory Visit
Admission: RE | Admit: 2018-03-31 | Discharge: 2018-03-31 | Disposition: A | Discharge status: Home / Self Care | Payer: Medicare Other | Source: Ambulatory Visit | Attending: Internal Medicine | Admitting: Internal Medicine

## 2018-03-31 DIAGNOSIS — Z794 Long term (current) use of insulin: Secondary | ICD-10-CM | POA: Diagnosis not present

## 2018-03-31 DIAGNOSIS — R221 Localized swelling, mass and lump, neck: Secondary | ICD-10-CM

## 2018-03-31 DIAGNOSIS — E1165 Type 2 diabetes mellitus with hyperglycemia: Secondary | ICD-10-CM | POA: Diagnosis not present

## 2018-03-31 DIAGNOSIS — Z6831 Body mass index (BMI) 31.0-31.9, adult: Secondary | ICD-10-CM | POA: Diagnosis not present

## 2018-03-31 DIAGNOSIS — E669 Obesity, unspecified: Secondary | ICD-10-CM | POA: Diagnosis not present

## 2018-04-05 DIAGNOSIS — H6121 Impacted cerumen, right ear: Secondary | ICD-10-CM | POA: Diagnosis not present

## 2018-04-06 ENCOUNTER — Telehealth: Payer: Self-pay | Admitting: Family Medicine

## 2018-04-06 NOTE — Telephone Encounter (Signed)
I was sent the copy of the ultrasound result, but not any information about it (or that I was to arrange for any further testing/evaluation). I'd actually like to examine [name] [name], in order to get the full history, see what we are dealing with, to best determine the next step (referral to surgeon, vs imaging).  I need to feel for any enlarged lymph nodes, and get more information before knowing what the next step is, since I didn't see/eval [name] for this.  The actual ultrasound results didn't seem concerning, except for that it doesn't match up with the history. I'd like to evaluate [name] plesae

## 2018-04-06 NOTE — Telephone Encounter (Signed)
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 The recommendation was that he have a biopsy or MRI. [name] was to send that ultra sound to [name] and request referral/ orders be handled. Pt has not heard anything so he wants to make sure you received that info from [name] and inquire about status. Please advise pt at [phone].

## 2018-04-07 NOTE — Telephone Encounter (Signed)
Pt coming in monday

## 2018-04-09 NOTE — Progress Notes (Signed)
Chief Complaint  Patient presents with  . Follow-up    on u/s of neck ordered by Dr.Kerr.    Patient presents for evaluation of mass at L posterior neck/occiput.  He had mentioned this to Dr. Buddy Duty at his last visit, and sent him for [place].  The US showed  3x1 cm ovoid subcutaneous mass L occipital neck, which could be consistent with lipoma, but that was inconsistent with the history given. The 1cm was reported as the "thickness", 3cm "length" (no width recorded).  He reports that Dr. Buddy Duty discussed a biopsy or MRI as next step, to be handled by PCP.  He now thinks it has been present longer than he originally thought (had [place] 4 [place] but really isn't sure how long it may have been there, possibly as long as a year or two). He started on Ozempic, and recalls reading that "if you get a knot on your neck, you should see your doctor." No change in the last 4-5 weeks.  It was never tender/painful.  PMH, PSH, SH reviewed  Outpatient Encounter Medications as of 04/10/2018  Medication Sig Note  . ACCU-CHEK FASTCLIX LANCETS MISC use three times a day WHEN CHECKING BLOOD SUGAR 10/10/2015: Received from: External Pharmacy  . ACCU-CHEK SMARTVIEW test strip 3 (three) times daily. for testing 10/10/2015: Received from: External Pharmacy  . amLODipine (NORVASC) 5 MG tablet TAKE 1 TABLET BY MOUTH ONCE DAILY   . B-D INS SYRINGE 0.5CC/31GX5/16 31G X 5/16" 0.5 ML MISC  12/24/2013: Received from: External Pharmacy  . BD INSULIN SYRINGE ULTRAFINE 31G X 15/64" 1 ML MISC  10/16/2014: Received from: External Pharmacy  . fenofibrate (TRICOR) 145 MG tablet take 1 tablet by mouth once daily   . losartan (COZAAR) 50 MG tablet Take 1 tablet (50 mg total) by mouth daily.   Marland Kitchen NOVOLIN 70/30 RELION (70-30) 100 UNIT/ML injection Inject 60 Units into the skin daily with breakfast. And 70 units with dinner 10/16/2014: Received from: External Pharmacy  . rosuvastatin (CRESTOR) 10 MG tablet TAKE  1 TABLET BY MOUTH DAILY   . sildenafil (REVATIO) 20 MG tablet Take 1 tablet (20 mg total) by mouth 3 (three) times daily.   . [DISCONTINUED] aspirin 81 MG tablet Take 81 mg by mouth daily.     . [DISCONTINUED] empagliflozin (JARDIANCE) 10 MG TABS tablet Take by mouth.   . [DISCONTINUED] metFORMIN (GLUCOPHAGE-XR) 500 MG 24 hr tablet Take by mouth.   . losartan-hydrochlorothiazide (HYZAAR) 100-12.5 MG tablet Take 1 tablet by mouth daily. (Patient not taking: Reported on 04/10/2018) 12/12/2017: Stopped 4-5 months ago (concern about recall)  . OZEMPIC, 0.25 OR 0.5 MG/DOSE, 2 MG/1.5ML SOPN INJ 0.5 MG [place] ONCE PER WK   . PAZEO 0.7 % SOLN 1 drop as needed.    . [DISCONTINUED] fenofibrate (TRICOR) 145 MG tablet TAKE 1 TABLET(145 MG) BY MOUTH DAILY   . [DISCONTINUED] FLUAD 0.5 ML SUSY ADM 0.5ML IM UTD    Facility-Administered Encounter Medications as of 04/10/2018  Medication  . 0.9 %  sodium chloride infusion   He hasn't been taking the ozempic since he noticed the lump  No Known Allergies  ROS: no fever, chills, thyroid symptoms, URI symptoms, headaches, dizziness, chest pain or other complaints.  PHYSICAL EXAM: BP 140/80   Pulse 68   Ht 5' 8.5" (1.74 m)   Wt 212 lb (96.2 kg)   BMI 31.77 kg/m   Well developed, pleasant male, in good spirits, in no  distress Neck: Skin on posterior neck is thickened. There is a visible asymmetry to the posterior neck, prominent on the left. There is a palpable soft tissue mass measuring 3x3 cm, nontender, soft.  No overlying skin abnormality. No thyromegaly. No lymphadenopathy.  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 Monitor for change  Type 2 diabetes mellitus with hyperglycemia, with long-term current use of insulin ([place]) - advised to restart ozempic (since neck mass is not related to thyroid)    Restart ozempic (the lump on the neck is not related to your  thyroid).  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 We can recheck at your upcoming physical, sooner if you notice any increase in size. The next step (if growing/changing) would be referral to surgeon for biopsy/excision).

## 2018-04-10 ENCOUNTER — Ambulatory Visit (INDEPENDENT_AMBULATORY_CARE_PROVIDER_SITE_OTHER): Payer: Medicare Other | Admitting: Family Medicine

## 2018-04-10 ENCOUNTER — Encounter: Payer: Self-pay | Admitting: Family Medicine

## 2018-04-10 VITALS — BP 140/80 | HR 68 | Ht 68.5 in | Wt 212.0 lb

## 2018-04-10 DIAGNOSIS — R221 Localized swelling, mass and lump, neck: Secondary | ICD-10-CM

## 2018-04-10 DIAGNOSIS — Z794 Long term (current) use of insulin: Secondary | ICD-10-CM

## 2018-04-10 DIAGNOSIS — E1165 Type 2 diabetes mellitus with hyperglycemia: Secondary | ICD-10-CM

## 2018-04-10 NOTE — Patient Instructions (Signed)
  Restart ozempic (the lump on the neck is not related to your thyroid).  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 We can recheck at your upcoming physical, sooner if you notice any increase in size. The next step (if growing/changing) would be referral to surgeon for biopsy/excision).  Lipoma A lipoma is a noncancerous (benign) tumor that is made up of fat cells. This is a very common type of soft-tissue growth. Lipomas are usually found under the skin (subcutaneous). They may occur in any tissue of the body that contains fat. Common areas for lipomas to appear include the back, shoulders, buttocks, and thighs. Lipomas grow slowly, and they are usually painless. Most lipomas do not cause problems and do not require treatment. What are the causes? The cause of this condition is not known. What increases the risk? This condition is more likely to develop in:  People who are 42-55 years old.  People who have a family history of lipomas.  What are the signs or symptoms? A lipoma usually appears as a small, round bump under the skin. It may feel soft or rubbery, but the firmness can vary. Most lipomas are not painful. However, a lipoma may become painful if it is located in an area where it pushes on nerves. How is this diagnosed? A lipoma can usually be diagnosed with a physical exam. You may also have tests to confirm the diagnosis and to rule out other conditions. Tests may include:  Imaging tests, such as a CT scan or MRI.  Removal of a tissue sample to be looked at under a microscope (biopsy).  How is this treated? Treatment is not needed for small lipomas that are not causing problems. If a lipoma continues to get bigger or it causes problems, removal is often the best option. Lipomas can also be removed to improve appearance. Removal of a lipoma is usually done with a surgery in which the fatty cells and the  surrounding capsule are removed. Most often, a medicine that numbs the area (local anesthetic) is used for this procedure. Follow these instructions at home:  Keep all follow-up visits as directed by your health care provider. This is important. Contact a health care provider if:  Your lipoma becomes larger or hard.  Your lipoma becomes painful, red, or increasingly swollen. These could be signs of infection or a more serious condition. This information is not intended to replace advice given to you by your health care provider. Make sure you discuss any questions you have with your health care provider. Document Released: 05/14/2002 Document Revised: 10/30/2015 Document Reviewed: 05/20/2014 Elsevier Interactive Patient Education  [employer].

## 2018-05-03 DIAGNOSIS — Z794 Long term (current) use of insulin: Secondary | ICD-10-CM | POA: Diagnosis not present

## 2018-05-03 DIAGNOSIS — E669 Obesity, unspecified: Secondary | ICD-10-CM | POA: Diagnosis not present

## 2018-05-03 DIAGNOSIS — E1165 Type 2 diabetes mellitus with hyperglycemia: Secondary | ICD-10-CM | POA: Diagnosis not present

## 2018-05-03 LAB — HEMOGLOBIN A1C: HEMOGLOBIN A1C: 7.6

## 2018-05-10 ENCOUNTER — Ambulatory Visit (INDEPENDENT_AMBULATORY_CARE_PROVIDER_SITE_OTHER): Payer: Medicare Other | Admitting: Podiatry

## 2018-05-10 ENCOUNTER — Encounter: Payer: Self-pay | Admitting: Podiatry

## 2018-05-10 DIAGNOSIS — M79676 Pain in unspecified toe(s): Secondary | ICD-10-CM | POA: Diagnosis not present

## 2018-05-10 DIAGNOSIS — E119 Type 2 diabetes mellitus without complications: Secondary | ICD-10-CM

## 2018-05-10 DIAGNOSIS — B351 Tinea unguium: Secondary | ICD-10-CM | POA: Diagnosis not present

## 2018-05-10 NOTE — Progress Notes (Signed)
Patient ID: [name], male   DOB: May 04, 1946, 72 y.o.   MRN: [id number] Complaint:  Visit Type: Patient returns to my office for continued preventative foot care services. Complaint: Patient states" my nails have grown long and thick and become painful to walk and wear shoes" Patient has been diagnosed with DM with no foot complications. The patient presents for preventative foot care services. No changes to ROS  Podiatric Exam: Vascular: dorsalis pedis and posterior tibial pulses are palpable bilateral. Capillary return is immediate. Temperature gradient is WNL. Skin turgor WNL  Sensorium: Normal Semmes Weinstein monofilament test. Normal tactile sensation bilaterally. Nail Exam: Pt has thick disfigured discolored nails with subungual debris noted bilateral entire nail hallux through fifth toenails Ulcer Exam: There is no evidence of ulcer or pre-ulcerative changes or infection. Orthopedic Exam: Muscle tone and strength are WNL. No limitations in general ROM. No crepitus or effusions noted. Foot type and digits show no abnormalities. Bony prominences are unremarkable. Skin: No Porokeratosis. No infection or ulcers  Diagnosis:  Onychomycosis, , Pain in right toe, pain in left toes  Treatment & Plan Procedures and Treatment: Consent by patient was obtained for treatment procedures. The patient understood the discussion of treatment and procedures well. All questions were answered thoroughly reviewed. Debridement of mycotic and hypertrophic toenails, 1 through 5 bilateral and clearing of subungual debris. No ulceration, no infection noted.  ABN signed for 2019. Return Visit-Office Procedure: Patient instructed to return to the office for a follow up visit 9 weeks  for continued evaluation and treatment.  Gardiner Barefoot DPM

## 2018-05-20 ENCOUNTER — Other Ambulatory Visit: Payer: Self-pay | Admitting: Family Medicine

## 2018-05-20 DIAGNOSIS — E1165 Type 2 diabetes mellitus with hyperglycemia: Secondary | ICD-10-CM

## 2018-05-20 DIAGNOSIS — Z794 Long term (current) use of insulin: Secondary | ICD-10-CM

## 2018-05-20 DIAGNOSIS — I10 Essential (primary) hypertension: Secondary | ICD-10-CM

## 2018-07-03 ENCOUNTER — Other Ambulatory Visit: Payer: Medicare Other

## 2018-07-03 ENCOUNTER — Telehealth: Payer: Self-pay

## 2018-07-03 DIAGNOSIS — Z125 Encounter for screening for malignant neoplasm of prostate: Secondary | ICD-10-CM

## 2018-07-03 DIAGNOSIS — E7800 Pure hypercholesterolemia, unspecified: Secondary | ICD-10-CM

## 2018-07-03 DIAGNOSIS — E1165 Type 2 diabetes mellitus with hyperglycemia: Secondary | ICD-10-CM

## 2018-07-03 DIAGNOSIS — Z794 Long term (current) use of insulin: Secondary | ICD-10-CM | POA: Diagnosis not present

## 2018-07-03 DIAGNOSIS — Z5181 Encounter for therapeutic drug level monitoring: Secondary | ICD-10-CM

## 2018-07-03 DIAGNOSIS — I10 Essential (primary) hypertension: Secondary | ICD-10-CM

## 2018-07-03 NOTE — Telephone Encounter (Signed)
Done

## 2018-07-03 NOTE — Telephone Encounter (Signed)
Please order fasting labs for patient.   PSA TSH CMET CBC Lipid    Thank you Cynid

## 2018-07-03 NOTE — Telephone Encounter (Signed)
Orders entered. PSA came up as not covered by Medicare.  Screening is recommended until age 72, last checked 05/2017, so due now. May not be covered, let him know

## 2018-07-04 LAB — PSA: Prostate Specific Ag, Serum: 1.8 ng/mL (ref 0.0–4.0)

## 2018-07-04 LAB — COMPREHENSIVE METABOLIC PANEL
ALT: 18 IU/L (ref 0–44)
AST: 17 IU/L (ref 0–40)
Albumin/Globulin Ratio: 2.8 — ABNORMAL HIGH (ref 1.2–2.2)
Albumin: 4.8 g/dL — ABNORMAL HIGH (ref 3.7–4.7)
Alkaline Phosphatase: 48 IU/L (ref 39–117)
BUN/Creatinine Ratio: 15 (ref 10–24)
BUN: 21 mg/dL (ref 8–27)
Bilirubin Total: 0.7 mg/dL (ref 0.0–1.2)
CO2: 22 mmol/L (ref 20–29)
CREATININE: 1.37 mg/dL — AB (ref 0.76–1.27)
Calcium: 9.8 mg/dL (ref 8.6–10.2)
Chloride: 100 mmol/L (ref 96–106)
GFR, EST AFRICAN AMERICAN: 60 mL/min/{1.73_m2} (ref >59)
GFR, EST NON AFRICAN AMERICAN: 52 mL/min/{1.73_m2} — AB (ref >59)
Globulin, Total: 1.7 g/dL (ref 1.5–4.5)
Glucose: 225 mg/dL — ABNORMAL HIGH (ref 65–99)
Potassium: 4.6 mmol/L (ref 3.5–5.2)
Sodium: 137 mmol/L (ref 134–144)
TOTAL PROTEIN: 6.5 g/dL (ref 6.0–8.5)

## 2018-07-04 LAB — CBC WITH DIFFERENTIAL/PLATELET
Basophils Absolute: 0 10*3/uL (ref 0.0–0.2)
Basos: 1 %
EOS (ABSOLUTE): 0.2 10*3/uL (ref 0.0–0.4)
Eos: 4 %
Hematocrit: 42.6 % (ref 37.5–51.0)
Hemoglobin: 14.5 g/dL (ref 13.0–17.7)
Immature Grans (Abs): 0 10*3/uL (ref 0.0–0.1)
Immature Granulocytes: 0 %
Lymphocytes Absolute: 1.3 10*3/uL (ref 0.7–3.1)
Lymphs: 27 %
MCH: 33.3 pg — AB (ref 26.6–33.0)
MCHC: 34 g/dL (ref 31.5–35.7)
MCV: 98 fL — ABNORMAL HIGH (ref 79–97)
MONOS ABS: 0.4 10*3/uL (ref 0.1–0.9)
Monocytes: 8 %
Neutrophils Absolute: 2.9 10*3/uL (ref 1.4–7.0)
Neutrophils: 60 %
Platelets: 232 10*3/uL (ref 150–450)
RBC: 4.35 x10E6/uL (ref 4.14–5.80)
RDW: 13.3 % (ref 11.6–15.4)
WBC: 4.8 10*3/uL (ref 3.4–10.8)

## 2018-07-04 LAB — LIPID PANEL
CHOL/HDL RATIO: 4.3 ratio (ref 0.0–5.0)
Cholesterol, Total: 143 mg/dL (ref 100–199)
HDL: 33 mg/dL — ABNORMAL LOW (ref >39)
LDL Calculated: 77 mg/dL (ref 0–99)
Triglycerides: 167 mg/dL — ABNORMAL HIGH (ref 0–149)
VLDL Cholesterol Cal: 33 mg/dL (ref 5–40)

## 2018-07-04 LAB — TSH: TSH: 1.02 u[IU]/mL (ref 0.450–4.500)

## 2018-07-08 ENCOUNTER — Other Ambulatory Visit: Payer: Self-pay | Admitting: Family Medicine

## 2018-07-16 DIAGNOSIS — E782 Mixed hyperlipidemia: Secondary | ICD-10-CM | POA: Insufficient documentation

## 2018-07-16 DIAGNOSIS — E785 Hyperlipidemia, unspecified: Secondary | ICD-10-CM | POA: Insufficient documentation

## 2018-07-16 NOTE — Progress Notes (Signed)
Chief Complaint  Patient presents with  . Medicare Wellness    nonfasting AWV, labs already done. No complaints today.     [name] is a 73 y.o. male who presents for annual wellness visit and follow-up on chronic medical conditions.  He has the following concerns:  He was last seen here in November for mass at L posterior neck/occiput.  He had mentioned this to [name] who sent him for [place].  The US showed  3x1 cm ovoid subcutaneous mass L occipital neck, which could be consistent with lipoma, but that was inconsistent with the history given. The 1cm was reported as the "thickness", 3cm "length" (no width recorded). Pt stated to me that the mass could have been there much longer than he realized. He hasn't noticed any change since his last visit.  Hypertension follow-up: At his visit in July he had reported that he stopped taking Losartan HCT about 4-5 months prior.  He was still taking amlodipine daily.  His blood pressure was fine, so instead of restarting Losartan HCT, he was started back only on losartan 50mg  daily. His BP was higher than usual at his November visit, 140/80.  It was 132/84 at Dr. Cindra Stone office in November. He doesn't monitor blood pressure elsewhere. Hasn't been able to walk since his wife had surgery last week. He denies headaches, dizziness, chest pain, palpitations, edema or shortness of breath.  Hyperlipidemia follow-up: Patient is reportedly following a low-fat, low cholesterol diet.His diet hasn't changed.Compliant with taking his Crestor and Tricor, and denies medication side effects. He had labs checked prior to visit, see below.  ED: has been using sildenafil 20mg  (in place of the Viagra from [place]), and 60mg  seems to be effective (3 tablets).   OSA: Uses CPAP machine nightly. He feels better when using it. Denies any problems, tolerating it well. Feels refreshed when he wakes up.  Sometimes feels a little tired during the day, thinks is just a sign of  his age.  He feels much better since using CPAP --only once forgot to take it when he went out of town for 2 days and he "was miserable".  He continues to get benefit from CPAP use.  DM: managed by [name]. His last A1c was 7.6% in November. He continues on Ozempic and 70/30 insulin (60 with breakfast, 70 with dinner).  Denies hypoglycemia. Sugars are usually running 120-140 (in the mid-afternoon); doesn't check other times of the day.  Denies polydipsia, polyuria.Gets routine care at podiatrist. Eye exam is UTD.   Immunization History  Administered Date(s) Administered  . Influenza Split 04/19/2011, 03/01/2012  . Influenza, High Dose Seasonal PF 03/07/2016, 03/29/2017, 02/28/2018  . Influenza-Unspecified 04/07/2014, 02/04/2015  . Pneumococcal Conjugate-13 01/22/2015  . Pneumococcal Polysaccharide-23 06/07/2004, 03/01/2012  . Td 06/07/2002  . Tdap 03/01/2012  . Zoster 04/18/2008   Last colonoscopy: 06/2016 tubular adenoma, repeat in 5 years Last PSA: 1.8 with recent labs (06/2018) Lab Results  Component Value Date   PSA 1.4 05/20/2017   PSA 1.8 06/15/2016   PSA 3.5 04/12/2016   Dentist: once yearly  Ophtho: once yearly Exercise: walksdog 30-35 minutes in the morning , 20-25 minutes in the evening 3x/week. Plays golf when weather allows (2x/week). No weight-bearing exercise.   Other doctors caring for patient include: [name] for diabetes.  Dr. Syrian Arab Stone for ophtho  Dr. Wilfrid Stone (GI) Dr. Woodfin Stone (dentist) Dr. Theodoro Stone (hasn't seen in years, retired)--has appt to see Dr. Pearline Stone Dr. Mayer--podiatrist Dr. Redmond Stone (ENT; cleans  his ears)  Depression screen: negative Fall screen: negative Functional status survey unremarkable Mini-Cog screen: normal See full screens in Epic  End of Life Discussion: Patient hasa living will and medical power of attorney.   Past Medical History:  Diagnosis Date  . Adhesive capsulitis of right shoulder    s/p surgery  .  Allergy   . Colon polyp 07/07/11   adenomatous and hyperplastic  . Diabetes mellitus   . Erectile dysfunction   . History of shingles mid 90's  . Hyperlipidemia elevated chol/TG  . Hypertension   . Kidney stones 8/07 [facility]  . Sleep apnea on CPAP  . Torn muscle 12/2013   ruptured gluteus medius ([name])    Past Surgical History:  Procedure Laterality Date  . ROTATOR CUFF REPAIR Right 2012  . TONSILLECTOMY AND ADENOIDECTOMY  1953    Social History   Socioeconomic History  . Marital status: Married    Spouse name: Not on file  . Number of children: 3  . Years of education: Not on file  . Highest education level: Not on file  Occupational History  . Occupation: Retired  Scientific laboratory technician  . Financial resource strain: Not on file  . Food insecurity:    Worry: Not on file    Inability: Not on file  . Transportation needs:    Medical: Not on file    Non-medical: Not on file  Tobacco Use  . Smoking status: Former Smoker    Last attempt to quit: 10/01/1986    Years since quitting: 31.8  . Smokeless tobacco: Never Used  Substance and Sexual Activity  . Alcohol use: Yes    Alcohol/week: 14.0 standard drinks    Types: 14 Standard drinks or equivalent per week    Comment: 2 drinks/day (scotch)--3 shots of alcohol/d  . Drug use: No  . Sexual activity: Yes    Partners: Female  Lifestyle  . Physical activity:    Days per week: Not on file    Minutes per session: Not on file  . Stress: Not on file  Relationships  . Social connections:    Talks on phone: Not on file    Gets together: Not on file    Attends religious service: Not on file    Active member of club or organization: Not on file    Attends meetings of clubs or organizations: Not on file    Relationship status: Not on file  . Intimate partner violence:    Fear of current or ex partner: Not on file    Emotionally abused: Not on file    Physically abused: Not on file    Forced sexual activity: Not on file   Other Topics Concern  . Not on file  Social History Narrative   Lives with wife, 1 dog.  2 children in [place], 1 in [place], 6 grandchildren.  Retired from Radiation protection practitioner.  Sees grandkids most days.   Bernadoodle named Hendricks Milo (puppy 10/2016)    Family History  Problem Relation Age of Onset  . Diabetes Mother   . Hyperlipidemia Mother   . Hypertension Mother   . Alzheimer's disease Mother   . Diabetes Father   . Hypertension Father   . Hyperlipidemia Brother   . Hypertension Maternal Grandmother   . Stroke Maternal Grandmother   . Heart attack Maternal Grandfather   . Prostate cancer Paternal Grandfather   . Hypertension Brother   . Colon cancer Neg Hx   . Stomach cancer Neg Hx  Outpatient Encounter Medications as of 07/17/2018  Medication Sig Note  . ACCU-CHEK FASTCLIX LANCETS MISC use three times a day WHEN CHECKING BLOOD SUGAR 10/10/2015: Received from: External Pharmacy  . ACCU-CHEK SMARTVIEW test strip 3 (three) times daily. for testing 10/10/2015: Received from: External Pharmacy  . amLODipine (NORVASC) 5 MG tablet TAKE 1 TABLET BY MOUTH ONCE DAILY   . B-D INS SYRINGE 0.5CC/31GX5/16 31G X 5/16" 0.5 ML MISC  12/24/2013: Received from: External Pharmacy  . BD INSULIN SYRINGE ULTRAFINE 31G X 15/64" 1 ML MISC  10/16/2014: Received from: External Pharmacy  . fenofibrate (TRICOR) 145 MG tablet TAKE 1 TABLET(145 MG) BY MOUTH DAILY   . losartan (COZAAR) 50 MG tablet TAKE 1 TABLET(50 MG) BY MOUTH DAILY   . NOVOLIN 70/30 RELION (70-30) 100 UNIT/ML injection Inject 60 Units into the skin daily with breakfast. And 70 units with dinner 10/16/2014: Received from: External Pharmacy  . OZEMPIC, 0.25 OR 0.5 MG/DOSE, 2 MG/1.5ML SOPN INJ 0.5 MG [place] ONCE PER WK   . PAZEO 0.7 % SOLN 1 drop as needed.    . rosuvastatin (CRESTOR) 10 MG tablet TAKE 1 TABLET BY MOUTH DAILY   . sildenafil (REVATIO) 20 MG tablet Take 1 tablet (20 mg total) by mouth 3 (three) times daily. 07/17/2018: Uses 60mg  with good results  .  losartan-hydrochlorothiazide (HYZAAR) 100-12.5 MG tablet Take 1 tablet by mouth daily. (Patient not taking: Reported on 07/17/2018) 12/12/2017: Stopped 4-5 months ago (concern about recall)   Facility-Administered Encounter Medications as of 07/17/2018  Medication  . 0.9 %  sodium chloride infusion    No Known Allergies   ROS: The patient denies anorexia, fever, headaches, vision loss, decreased hearing, ear pain, hoarseness, chest pain, palpitations, dizziness, syncope, dyspnea on exertion, cough, swelling, nausea, vomiting, constipation, melena, hematochezia, indigestion/heartburn, hematuria, incontinence, nocturia, weakened urine stream, dysuria, genital lesions, joint pains (just some morning stiffness, back), numbness, tingling, weakness, tremor, suspicious skin lesions, depression, anxiety, abnormal bleeding/bruising, or enlarged lymph nodes. +erectile dysfunction  Occasional hesitancy. Some frequency at 4-6pm, but sleeps through the night. No change to soft tissue mass on back of neck. Occasional gas/bloating (less since off metformin, but still gets intermittently)   PHYSICAL EXAM:  BP (!) 174/90   Pulse 76   Ht 5' 8.5" (1.74 m)   Wt 214 lb 3.2 oz (97.2 kg)   BMI 32.10 kg/m    160/80 on repeat by MD  Wt Readings from Last 3 Encounters:  07/17/18 214 lb 3.2 oz (97.2 kg)  04/10/18 212 lb (96.2 kg)  12/12/17 212 lb 12.8 oz (96.5 kg)    General Appearance:  Alert, cooperative, no distress, appears stated age   Head:  Normocephalic, without obvious abnormality, atraumatic   Eyes:  PERRL, conjunctiva/corneas clear, EOM's intact, fundi benign   Ears:  Normal TM's and external ear canals Soft cerumen noted along inferior canal on right. Some scarring noted on left.  Nose:  Nares normal, mucosa normal, no drainage or sinus tenderness. Bulbous nose  Throat:  Lips, mucosa, and tongue normal; teeth and gums normal   Neck:  Supple, no lymphadenopathy; thyroid: no  enlargement/ tenderness/nodules; no carotid bruit or JVD. 3x2.5-3 cm soft tissue mass left posterior neck. Nontender, unchanged  Back:  Spine nontender, no curvature, ROM normal, no CVA tenderness   Lungs:  Clear to auscultation bilaterally without wheezes, rales or ronchi; respirations unlabored   Chest Wall:  No tenderness or deformity   Heart:  Regular rate and rhythm, S1 and S2  normal, no murmur, rub  or gallop   Breast Exam:  No chest wall tenderness, masses or gynecomastia   Abdomen:  Soft, non-tender, nondistended, normoactive bowel sounds, no masses, no hepatosplenomegaly   Genitalia:  Normal male external genitalia without lesions. Testicles without masses. No inguinal hernias.   Rectal:  Normal sphincter tone, no masses or tenderness; guaiac negative stool. Small noninflamed external hemorrhoidal tag. Prostate smooth, no nodules, mild-mod enlarged. No nodule/mass.   Extremities:  No clubbing, cyanosis or edema   Pulses:  2+ and symmetric all extremities   Skin:  Skin color, texture, turgor normal, no rashes. Dry, hyperkeratotic patches and AK's on bilateral LE's. See below for diabetic foot exam.  Lymph nodes:  Cervical, supraclavicular, and axillary nodes normal   Neurologic:  CNII-XII intact, normal strength, sensation and gait; reflexes 2+ and symmetric throughout    Psych: Normal mood, affect, hygiene and grooming.       Diabetic foot exam was performed: Thickened/discolored toenails (1, 2, 3, 5 on the right, discolored at 1 and 5 (only slightly) on the left.) Remainder of foot exam is normal. There is 64mm flesh colored nodule on right foot, medial heel area, unchanged from last year. Skin is very dry.       Chemistry      Component Value Date/Time   NA 137 07/03/2018 0830   K 4.6 07/03/2018 0830   CL 100 07/03/2018 0830   CO2 22 07/03/2018 0830   BUN 21 07/03/2018 0830   CREATININE 1.37 (H) 07/03/2018 0830    CREATININE 1.20 (H) 05/20/2017 0821      Component Value Date/Time   CALCIUM 9.8 07/03/2018 0830   ALKPHOS 48 07/03/2018 0830   AST 17 07/03/2018 0830   ALT 18 07/03/2018 0830   BILITOT 0.7 07/03/2018 0830     Fasting glucose 225  Lab Results  Component Value Date   TSH 1.020 07/03/2018   PSA 1.8  Lab Results  Component Value Date   WBC 4.8 07/03/2018   HGB 14.5 07/03/2018   HCT 42.6 07/03/2018   MCV 98 (H) 07/03/2018   PLT 232 07/03/2018   Lab Results  Component Value Date   CHOL 143 07/03/2018   HDL 33 (L) 07/03/2018   LDLCALC 77 07/03/2018   TRIG 167 (H) 07/03/2018   CHOLHDL 4.3 07/03/2018     ASSESSMENT/PLAN:  Medicare annual wellness visit, subsequent  OSA on CPAP - compliant with use; continues to benefit from CPAP use.  Weight loss encouraged  Type 2 diabetes mellitus with hyperglycemia, with long-term current use of insulin (HCC) - per [name]. Rec checking other times of day (morning/after dinner or HS), record and bring to endo f/u. Cut back on alcohol; wt loss rec - Plan: losartan (COZAAR) 100 MG tablet  Essential hypertension, benign - elevated today; increase losartan to 100mg  (if needed, change back to Losartan HCT if BP's not at goal) - Plan: losartan (COZAAR) 100 MG tablet  Class 1 obesity due to excess calories with serious comorbidity and body mass index (BMI) of 32.0 to 32.9 in adult - discussed healthy diet, cut back portions, alcohol. risks reviewed  Erectile dysfunction, unspecified erectile dysfunction type  Neck mass - suspect lipoma, per [place], stable in size  Mixed hyperlipidemia - low HDL, elevated TG (glu also high), LDL >70; rec increase Crestor dose, cut tricor dose. recheck 2-3 mos - Plan: rosuvastatin (CRESTOR) 20 MG tablet, fenofibrate (TRICOR) 145 MG tablet  Hyperlipidemia associated with type 2 diabetes mellitus (  [place])  Hypertension associated with diabetes ([place])  Type 2 diabetes mellitus with hyperglycemia, with long-term  current use of insulin ([place]) - monitored per [name], borderline control. Fglu very high--encouraged to check sugar mornings and evenings - Plan: losartan (COZAAR) 100 MG tablet  Pure hypercholesterolemia - only low dose Crestor. Rec increasing to 20mg  (should get LDL<70) - Plan: rosuvastatin (CRESTOR) 20 MG tablet   Recommended at least 30 minutes of aerobic activity at least 5 days/week, weight-bearing exercise 2x/week; proper sunscreen use reviewed; healthy diet and alcohol recommendations (less than or equal to 2 drinks/day) reviewed; regular seatbelt use; changing batteries in smoke detectors. Immunization recommendations discussed,UTD;continue high dose flu shots; Shingrix recommended and side effects reviewed. Colonoscopy recommendations reviewed--due again 06/2021.    MOST form reviewed and signed; Full Code, Full Care Asked to get [place] copies of his Living Will and healthcare POA.  We will send copies of labs to [name]  Please vary the times that you check your blood sugars--checking some at bedtime (2 hours after dinner) and some mornings, as these other times can be helpful in determining if your evening insulin dose is appropriate.  We are changing your rosuvastatin (Crestor) from 10mg  up to 20mg  (you can take two of your remaining pills you have at home, new prescription is for the higher dose, only take 1). We are also planning to lower your fenofibrate from 145mg  to 48mg , but since you just got the 145mg  refilled, for now just cut the dose in half.   Be sure to limit your sweets, sugar, fried foods, alcohol to help keep the triglycerides down, and limit your cheese, eggs, red meat, etc as we discussed.  Please try and cut back on alcohol--you should not have more than 2 shots/day, and preferably less to help with lowering sugar, triglycerides and weight loss.  Your blood pressure was high today.  We are increasing your losartan to 100mg .  You can finish up the current 50mg   prescription by taking 2 daily (together).  New prescription for 100mg  tablet was sent to your pharmacy.  Return for fasting visit with labs in 2-3 months     Medicare Attestation I have personally reviewed: The patient's medical and social history Their use of alcohol, tobacco or illicit drugs Their current medications and supplements The patient's functional ability including ADLs,fall risks, home safety risks, cognitive, and hearing and visual impairment Diet and physical activities Evidence for depression or mood disorders  The patient's weight, height and BMI have been recorded in the chart.  I have made referrals, counseling, and provided education to the patient based on review of the above and I have provided the patient with a written personalized care plan for preventive services.

## 2018-07-16 NOTE — Patient Instructions (Addendum)
HEALTH MAINTENANCE RECOMMENDATIONS:  It is recommended that you get at least 30 minutes of aerobic exercise at least 5 days/week (for weight loss, you may need as much as 60-90 minutes). This can be any activity that gets your heart rate up. This can be divided in 10-15 minute intervals if needed, but try and build up your endurance at least once a week.  Weight bearing exercise is also recommended twice weekly.  Eat a healthy diet with lots of vegetables, fruits and fiber.  "Colorful" foods have a lot of vitamins (ie green vegetables, tomatoes, red peppers, etc).  Limit sweet tea, regular sodas and alcoholic beverages, all of which has a lot of calories and sugar.  Up to 2 alcoholic drinks daily may be beneficial for men (unless trying to lose weight, watch sugars).  Drink a lot of water.  Sunscreen of at least SPF 30 should be used on all sun-exposed parts of the skin when outside between the hours of 10 am and 4 pm (not just when at beach or pool, but even with exercise, golf, tennis, and yard work!)  Use a sunscreen that says "broad spectrum" so it covers both UVA and UVB rays, and make sure to reapply every 1-2 hours.  Remember to change the batteries in your smoke detectors when changing your clock times in the spring and fall.  Use your seat belt every time you are in a car, and please drive safely and not be distracted with cell phones and texting while driving.    [name] , Thank you for taking time to come for your Medicare Wellness Visit. I appreciate your ongoing commitment to your health goals. Please review the following plan we discussed and let me know if I can assist you in the future.    This is a list of the screening recommended for you and due dates:  Health Maintenance  Topic Date Due  . Hemoglobin A1C  01/26/2018  . Complete foot exam   06/09/2018  . Eye exam for diabetics  11/29/2018  . Colon Cancer Screening  07/07/2021  . Tetanus Vaccine  03/01/2022  . Flu  Shot  Completed  .  Hepatitis C: One time screening is recommended by Center for Disease Control  (CDC) for  adults born from [birth year] through 1964.   Completed  . Pneumonia vaccines  Completed   Your A1c is done by Dr. Buddy Duty (ignore date above). Your foot exams are done by your podiatrist, and was repeated again today (ignore date above).  I recommend getting the new shingles vaccine (Shingrix). You will need to check with your insurance to see if it is covered, and if covered by Medicare Part D, you need to get from the pharmacy rather than our office.  It is a series of 2 injections, spaced 2 months apart.   Please vary the times that you check your blood sugars--checking some at bedtime (2 hours after dinner) and some mornings, as these other times can be helpful in determining if your evening insulin dose is appropriate.  We are changing your rosuvastatin (Crestor) from 10mg  up to 20mg  (you can take two of your remaining pills you have at home, new prescription is for the higher dose, only take 1). We are also planning to lower your fenofibrate from 145mg  to 48mg , but since you just got the 145mg  refilled, for now just cut the dose in half.   Be sure to limit your sweets, sugar, fried foods, alcohol to help  keep the triglycerides down, and limit your cheese, eggs, red meat, etc as we discussed.  Please try and cut back on alcohol--you should not have more than 2 shots/day, and preferably less to help with lowering sugar, triglycerides and weight loss.  Your blood pressure was high today.  We are increasing your losartan to 100mg .  You can finish up the current 50mg  prescription by taking 2 daily (together).  New prescription for 100mg  tablet was sent to your pharmacy.  Return for fasting visit with labs in 2-3 months

## 2018-07-17 ENCOUNTER — Encounter: Payer: Self-pay | Admitting: Family Medicine

## 2018-07-17 ENCOUNTER — Ambulatory Visit (INDEPENDENT_AMBULATORY_CARE_PROVIDER_SITE_OTHER): Payer: Medicare Other | Admitting: Family Medicine

## 2018-07-17 VITALS — BP 160/80 | HR 76 | Ht 68.5 in | Wt 214.2 lb

## 2018-07-17 DIAGNOSIS — E785 Hyperlipidemia, unspecified: Secondary | ICD-10-CM

## 2018-07-17 DIAGNOSIS — E1169 Type 2 diabetes mellitus with other specified complication: Secondary | ICD-10-CM

## 2018-07-17 DIAGNOSIS — Z6832 Body mass index (BMI) 32.0-32.9, adult: Secondary | ICD-10-CM

## 2018-07-17 DIAGNOSIS — G4733 Obstructive sleep apnea (adult) (pediatric): Secondary | ICD-10-CM | POA: Diagnosis not present

## 2018-07-17 DIAGNOSIS — E1159 Type 2 diabetes mellitus with other circulatory complications: Secondary | ICD-10-CM

## 2018-07-17 DIAGNOSIS — Z794 Long term (current) use of insulin: Secondary | ICD-10-CM | POA: Diagnosis not present

## 2018-07-17 DIAGNOSIS — I10 Essential (primary) hypertension: Secondary | ICD-10-CM

## 2018-07-17 DIAGNOSIS — N529 Male erectile dysfunction, unspecified: Secondary | ICD-10-CM

## 2018-07-17 DIAGNOSIS — Z0000 Encounter for general adult medical examination without abnormal findings: Secondary | ICD-10-CM | POA: Diagnosis not present

## 2018-07-17 DIAGNOSIS — E782 Mixed hyperlipidemia: Secondary | ICD-10-CM | POA: Diagnosis not present

## 2018-07-17 DIAGNOSIS — Z9989 Dependence on other enabling machines and devices: Secondary | ICD-10-CM

## 2018-07-17 DIAGNOSIS — R221 Localized swelling, mass and lump, neck: Secondary | ICD-10-CM

## 2018-07-17 DIAGNOSIS — E6609 Other obesity due to excess calories: Secondary | ICD-10-CM | POA: Diagnosis not present

## 2018-07-17 DIAGNOSIS — E1165 Type 2 diabetes mellitus with hyperglycemia: Secondary | ICD-10-CM

## 2018-07-17 DIAGNOSIS — E7800 Pure hypercholesterolemia, unspecified: Secondary | ICD-10-CM

## 2018-07-17 MED ORDER — FENOFIBRATE 145 MG PO TABS: ORAL_TABLET | ORAL | 0 refills | Status: DC

## 2018-07-17 MED ORDER — ROSUVASTATIN CALCIUM 20 MG PO TABS: 20.0000 mg | ORAL_TABLET | Freq: Every day | ORAL | 0 refills | Status: DC

## 2018-07-17 MED ORDER — LOSARTAN POTASSIUM 100 MG PO TABS: 100.0000 mg | ORAL_TABLET | Freq: Every day | ORAL | 0 refills | Status: DC

## 2018-07-26 ENCOUNTER — Ambulatory Visit: Payer: Medicare Other | Admitting: Podiatry

## 2018-07-26 DIAGNOSIS — L821 Other seborrheic keratosis: Secondary | ICD-10-CM | POA: Diagnosis not present

## 2018-07-26 DIAGNOSIS — D229 Melanocytic nevi, unspecified: Secondary | ICD-10-CM | POA: Diagnosis not present

## 2018-07-26 DIAGNOSIS — L738 Other specified follicular disorders: Secondary | ICD-10-CM | POA: Diagnosis not present

## 2018-07-26 DIAGNOSIS — L570 Actinic keratosis: Secondary | ICD-10-CM | POA: Diagnosis not present

## 2018-07-26 DIAGNOSIS — D1801 Hemangioma of skin and subcutaneous tissue: Secondary | ICD-10-CM | POA: Diagnosis not present

## 2018-07-28 ENCOUNTER — Ambulatory Visit: Payer: Medicare Other | Admitting: Podiatry

## 2018-08-04 ENCOUNTER — Encounter: Payer: Self-pay | Admitting: Podiatry

## 2018-08-04 ENCOUNTER — Encounter: Payer: Self-pay | Admitting: Family Medicine

## 2018-08-04 ENCOUNTER — Ambulatory Visit (INDEPENDENT_AMBULATORY_CARE_PROVIDER_SITE_OTHER): Payer: Medicare Other | Admitting: Podiatry

## 2018-08-04 DIAGNOSIS — M79676 Pain in unspecified toe(s): Secondary | ICD-10-CM

## 2018-08-04 DIAGNOSIS — B351 Tinea unguium: Secondary | ICD-10-CM

## 2018-08-04 DIAGNOSIS — E119 Type 2 diabetes mellitus without complications: Secondary | ICD-10-CM

## 2018-08-04 NOTE — Progress Notes (Signed)
Patient ID: [name], male   DOB: July 21, 1945, 73 y.o.   MRN: [id number] Complaint:  Visit Type: Patient returns to my office for continued preventative foot care services. Complaint: Patient states" my nails have grown long and thick and become painful to walk and wear shoes" Patient has been diagnosed with DM with no foot complications. The patient presents for preventative foot care services. No changes to ROS  Podiatric Exam: Vascular: dorsalis pedis and posterior tibial pulses are palpable bilateral. Capillary return is immediate. Temperature gradient is WNL. Skin turgor WNL  Sensorium: Normal Semmes Weinstein monofilament test. Normal tactile sensation bilaterally. Nail Exam: Pt has thick disfigured discolored nails with subungual debris noted bilateral entire nail hallux through fifth toenails Ulcer Exam: There is no evidence of ulcer or pre-ulcerative changes or infection. Orthopedic Exam: Muscle tone and strength are WNL. No limitations in general ROM. No crepitus or effusions noted. Foot type and digits show no abnormalities. Bony prominences are unremarkable. Skin: No Porokeratosis. No infection or ulcers  Diagnosis:  Onychomycosis, , Pain in right toe, pain in left toes  Treatment & Plan Procedures and Treatment: Consent by patient was obtained for treatment procedures. The patient understood the discussion of treatment and procedures well. All questions were answered thoroughly reviewed. Debridement of mycotic and hypertrophic toenails, 1 through 5 bilateral and clearing of subungual debris. No ulceration, no infection noted.   Return Visit-Office Procedure: Patient instructed to return to the office for a follow up visit 9 weeks  for continued evaluation and treatment.  Gardiner Barefoot DPM

## 2018-08-25 ENCOUNTER — Other Ambulatory Visit: Payer: Self-pay | Admitting: Family Medicine

## 2018-08-25 DIAGNOSIS — E1165 Type 2 diabetes mellitus with hyperglycemia: Secondary | ICD-10-CM

## 2018-08-25 DIAGNOSIS — Z794 Long term (current) use of insulin: Secondary | ICD-10-CM

## 2018-08-25 DIAGNOSIS — I10 Essential (primary) hypertension: Secondary | ICD-10-CM

## 2018-08-25 NOTE — Telephone Encounter (Signed)
Pt was switched to losartan 100mg 

## 2018-09-20 ENCOUNTER — Other Ambulatory Visit: Payer: Self-pay | Admitting: Family Medicine

## 2018-09-20 DIAGNOSIS — E1165 Type 2 diabetes mellitus with hyperglycemia: Secondary | ICD-10-CM

## 2018-09-20 DIAGNOSIS — E782 Mixed hyperlipidemia: Secondary | ICD-10-CM

## 2018-09-20 DIAGNOSIS — I10 Essential (primary) hypertension: Secondary | ICD-10-CM

## 2018-09-20 DIAGNOSIS — Z5181 Encounter for therapeutic drug level monitoring: Secondary | ICD-10-CM

## 2018-09-20 DIAGNOSIS — Z794 Long term (current) use of insulin: Secondary | ICD-10-CM

## 2018-10-05 ENCOUNTER — Encounter: Payer: Medicare Other | Admitting: Family Medicine

## 2018-10-11 ENCOUNTER — Ambulatory Visit (INDEPENDENT_AMBULATORY_CARE_PROVIDER_SITE_OTHER): Payer: Medicare Other | Admitting: Podiatry

## 2018-10-11 ENCOUNTER — Other Ambulatory Visit: Payer: Self-pay

## 2018-10-11 ENCOUNTER — Encounter: Payer: Self-pay | Admitting: Podiatry

## 2018-10-11 VITALS — Temp 97.3°F

## 2018-10-11 DIAGNOSIS — E119 Type 2 diabetes mellitus without complications: Secondary | ICD-10-CM

## 2018-10-11 DIAGNOSIS — M79676 Pain in unspecified toe(s): Secondary | ICD-10-CM

## 2018-10-11 DIAGNOSIS — B351 Tinea unguium: Secondary | ICD-10-CM

## 2018-10-11 NOTE — Progress Notes (Signed)
Patient ID: [name], male   DOB: 1946-03-01, 73 y.o.   MRN: [id number] Complaint:  Visit Type: Patient returns to my office for continued preventative foot care services. Complaint: Patient states" my nails have grown long and thick and become painful to walk and wear shoes" Patient has been diagnosed with DM with no foot complications. The patient presents for preventative foot care services. No changes to ROS  Podiatric Exam: Vascular: dorsalis pedis and posterior tibial pulses are palpable bilateral. Capillary return is immediate. Temperature gradient is WNL. Skin turgor WNL  Sensorium: Normal Semmes Weinstein monofilament test. Normal tactile sensation bilaterally. Nail Exam: Pt has thick disfigured discolored nails with subungual debris noted bilateral entire nail hallux through fifth toenails Ulcer Exam: There is no evidence of ulcer or pre-ulcerative changes or infection. Orthopedic Exam: Muscle tone and strength are WNL. No limitations in general ROM. No crepitus or effusions noted. Foot type and digits show no abnormalities. Bony prominences are unremarkable. Skin: No Porokeratosis. No infection or ulcers  Diagnosis:  Onychomycosis, , Pain in right toe, pain in left toes  Treatment & Plan Procedures and Treatment: Consent by patient was obtained for treatment procedures. The patient understood the discussion of treatment and procedures well. All questions were answered thoroughly reviewed. Debridement of mycotic and hypertrophic toenails, 1 through 5 bilateral and clearing of subungual debris. No ulceration, no infection noted.   Return Visit-Office Procedure: Patient instructed to return to the office for a follow up visit 9 weeks  for continued evaluation and treatment.  Gardiner Barefoot DPM

## 2018-10-25 DIAGNOSIS — Z794 Long term (current) use of insulin: Secondary | ICD-10-CM | POA: Diagnosis not present

## 2018-10-25 DIAGNOSIS — E1165 Type 2 diabetes mellitus with hyperglycemia: Secondary | ICD-10-CM | POA: Diagnosis not present

## 2018-10-25 DIAGNOSIS — E669 Obesity, unspecified: Secondary | ICD-10-CM | POA: Diagnosis not present

## 2018-11-02 DIAGNOSIS — H6123 Impacted cerumen, bilateral: Secondary | ICD-10-CM | POA: Diagnosis not present

## 2018-11-04 ENCOUNTER — Other Ambulatory Visit: Payer: Self-pay | Admitting: Family Medicine

## 2018-11-04 DIAGNOSIS — Z794 Long term (current) use of insulin: Secondary | ICD-10-CM

## 2018-11-04 DIAGNOSIS — E1165 Type 2 diabetes mellitus with hyperglycemia: Secondary | ICD-10-CM

## 2018-11-07 ENCOUNTER — Other Ambulatory Visit: Payer: Self-pay | Admitting: Family Medicine

## 2018-11-07 DIAGNOSIS — E1165 Type 2 diabetes mellitus with hyperglycemia: Secondary | ICD-10-CM

## 2018-11-07 DIAGNOSIS — E782 Mixed hyperlipidemia: Secondary | ICD-10-CM

## 2018-11-07 DIAGNOSIS — I10 Essential (primary) hypertension: Secondary | ICD-10-CM

## 2018-11-07 DIAGNOSIS — Z794 Long term (current) use of insulin: Secondary | ICD-10-CM

## 2018-11-07 DIAGNOSIS — E7800 Pure hypercholesterolemia, unspecified: Secondary | ICD-10-CM

## 2018-11-07 NOTE — Telephone Encounter (Signed)
Pt was almost out and I sent in 30 days but coming back as 90 days

## 2018-11-07 NOTE — Telephone Encounter (Signed)
Pt has an upcoming appt

## 2018-11-13 ENCOUNTER — Other Ambulatory Visit: Payer: Medicare Other

## 2018-11-13 ENCOUNTER — Other Ambulatory Visit: Payer: Self-pay

## 2018-11-13 DIAGNOSIS — E782 Mixed hyperlipidemia: Secondary | ICD-10-CM | POA: Diagnosis not present

## 2018-11-13 DIAGNOSIS — E1165 Type 2 diabetes mellitus with hyperglycemia: Secondary | ICD-10-CM

## 2018-11-13 DIAGNOSIS — Z5181 Encounter for therapeutic drug level monitoring: Secondary | ICD-10-CM

## 2018-11-13 DIAGNOSIS — I10 Essential (primary) hypertension: Secondary | ICD-10-CM | POA: Diagnosis not present

## 2018-11-13 DIAGNOSIS — Z794 Long term (current) use of insulin: Secondary | ICD-10-CM | POA: Diagnosis not present

## 2018-11-13 LAB — LIPID PANEL

## 2018-11-14 LAB — LIPID PANEL
Chol/HDL Ratio: 4.1 ratio (ref 0.0–5.0)
Cholesterol, Total: 128 mg/dL (ref 100–199)
HDL: 31 mg/dL — ABNORMAL LOW (ref >39)
LDL Calculated: 64 mg/dL (ref 0–99)
Triglycerides: 163 mg/dL — ABNORMAL HIGH (ref 0–149)
VLDL Cholesterol Cal: 33 mg/dL (ref 5–40)

## 2018-11-14 LAB — COMPREHENSIVE METABOLIC PANEL
ALT: 20 IU/L (ref 0–44)
AST: 17 IU/L (ref 0–40)
Albumin/Globulin Ratio: 2.3 — ABNORMAL HIGH (ref 1.2–2.2)
Albumin: 4.5 g/dL (ref 3.7–4.7)
Alkaline Phosphatase: 56 IU/L (ref 39–117)
BUN/Creatinine Ratio: 13 (ref 10–24)
BUN: 16 mg/dL (ref 8–27)
Bilirubin Total: 0.8 mg/dL (ref 0.0–1.2)
CO2: 21 mmol/L (ref 20–29)
Calcium: 9.9 mg/dL (ref 8.6–10.2)
Chloride: 101 mmol/L (ref 96–106)
Creatinine, Ser: 1.26 mg/dL (ref 0.76–1.27)
GFR calc Af Amer: 65 mL/min/{1.73_m2} (ref >59)
GFR calc non Af Amer: 57 mL/min/{1.73_m2} — ABNORMAL LOW (ref >59)
Globulin, Total: 2 g/dL (ref 1.5–4.5)
Glucose: 265 mg/dL — ABNORMAL HIGH (ref 65–99)
Potassium: 4.8 mmol/L (ref 3.5–5.2)
Sodium: 137 mmol/L (ref 134–144)
Total Protein: 6.5 g/dL (ref 6.0–8.5)

## 2018-11-14 LAB — HEMOGLOBIN A1C
Est. average glucose Bld gHb Est-mCnc: 200 mg/dL
Hgb A1c MFr Bld: 8.6 % — ABNORMAL HIGH (ref 4.8–5.6)

## 2018-11-14 NOTE — Progress Notes (Signed)
Start time: 10:45 End time: 11:10 UNABLE TO DO VIDEO--couldn't connect with his phone.  Changed to telephone visit.   Virtual Visit via Telephone Note  I connected with [name] on 11/15/2018 by telephone (after failing to be able to connect via video) and verified that I am speaking with the correct person using two identifiers.  Location: Patient: home, in room alone (wife also at home). Provider: office   I discussed the limitations of evaluation and management by telemedicine and the availability of in person appointments. The patient expressed understanding and agreed to proceed. He consents to this visit being filed with his insurance.  History of Present Illness:  Hypertension follow-up: He is taking amlodipine daily, and losartan dose was increased from 50 to 100mg  at his last visit in February. At his visit in July he had reported that he stopped taking Losartan HCT about 4-5 months prior.  He was still taking amlodipine daily.  His blood pressure was fine, so instead of restarting Losartan HCT, he was started back only on losartan 50mg  daily. His BP was higher than usual at his November visit, 140/80.  It was 132/84 at Dr. Cindra Eves office in November and BP was 160/80 at his [facility] in February, so losartan dose was increased.   BP 2 weeks ago 135/72.   He denies headaches, dizziness, chest pain, palpitations, edema or shortness of breath.  Hyperlipidemia follow-up: Patient is reportedly following a low-fat, low cholesterol diet.His diet hasn't changed.Compliant with taking his Crestor and Tricor, and denies medication side effects.He had labs checked prior to visit, see below.  DM: managed by Dr. Buddy Duty. His last A1c was7.6% in November.  He states he last saw Dr. Buddy Duty in Feb, and A1c was 7.1% He requested that we check his A1c with labs drawn here, and to forward them to Dr. Buddy Duty (which we did). Labs below.  Increased stress, he took a big hit with the stock market  (though it is improving some).  Denies any significant changes to his diet to account for sugar changes.  He does admit to eating more carbs in the past--eating fewer salads, eating 1/2 sandwich daily. No change in alcohol or exercise. Low sodium [place] or ham    He admits to not checking his sugars very often. He continues onOzempic and70/30 insulin (60 with breakfast, 70 with dinner). Denies hypoglycemia. Denies polydipsia, polyuria.Gets routine care at podiatrist. He denies any neuropathy or lesions. Eye exam is UTD (was 11/2017, due again now, just got notification that he is due).  PMH, PSH, SH reviewed  Outpatient Encounter Medications as of 11/15/2018  Medication Sig Note  . ACCU-CHEK FASTCLIX LANCETS MISC use three times a day WHEN CHECKING BLOOD SUGAR 10/10/2015: Received from: External Pharmacy  . ACCU-CHEK SMARTVIEW test strip 3 (three) times daily. for testing 10/10/2015: Received from: External Pharmacy  . amLODipine (NORVASC) 5 MG tablet Take 1 tablet (5 mg total) by mouth daily.   . B-D INS SYRINGE 0.5CC/31GX5/16 31G X 5/16" 0.5 ML MISC  12/24/2013: Received from: External Pharmacy  . BD INSULIN SYRINGE ULTRAFINE 31G X 15/64" 1 ML MISC  10/16/2014: Received from: External Pharmacy  . fenofibrate (TRICOR) 145 MG tablet Take 1/2 tablet daily   . losartan (COZAAR) 100 MG tablet TAKE 1 TABLET(100 MG) BY MOUTH DAILY   . NOVOLIN 70/30 RELION (70-30) 100 UNIT/ML injection Inject 60 Units into the skin daily with breakfast. And 70 units with dinner 10/16/2014: Received from: External Pharmacy  . OZEMPIC, 0.25  OR 0.5 MG/DOSE, 2 MG/1.5ML SOPN INJ 0.5 MG [place] ONCE PER WK   . rosuvastatin (CRESTOR) 20 MG tablet TAKE 1 TABLET(20 MG) BY MOUTH DAILY   . sildenafil (REVATIO) 20 MG tablet Take 1 tablet (20 mg total) by mouth 3 (three) times daily. 07/17/2018: Uses 60mg  with good results  . [DISCONTINUED] amLODipine (NORVASC) 5 MG tablet TAKE 1 TABLET BY MOUTH EVERY DAY   . PAZEO 0.7 % SOLN 1 drop as  needed.    . [DISCONTINUED] azithromycin (ZITHROMAX) 250 MG tablet     Facility-Administered Encounter Medications as of 11/15/2018  Medication  . 0.9 %  sodium chloride infusion   No Known Allergies  ROS:  No fever, chills, URI symptoms, chest pain, shortness of breath, cough. No headaches, dizziness, nausea, vomiting, bowel changes, heartburn, no urinary complaints. No bleeding, bruising, rashes. Continues to see podiatrist regularly, denies concerns. See HPI.   Observations/Objective:  BP (!) 152/78   Pulse 79   Temp 98.5 F (36.9 C) (Oral)   Ht 5' 8.5" (1.74 m)   Wt 211 lb (95.7 kg)   BMI 31.62 kg/m    Wt Readings from Last 3 Encounters:  11/13/18 211 lb (95.7 kg)  07/17/18 214 lb 3.2 oz (97.2 kg)  04/10/18 212 lb (96.2 kg)   Pleasant male, in no distress He is alert, oriented, in good spirits. I see him walking his dog daily (lives nearby)--normal gait, cranial nerves intact. Exam is otherwise limited at today's visit, due to virtual/telephone nature of the visit.  Lab Results  Component Value Date   HGBA1C 8.6 (H) 11/13/2018   Fasting glucose 265    Chemistry      Component Value Date/Time   NA 137 11/13/2018 0937   K 4.8 11/13/2018 0937   CL 101 11/13/2018 0937   CO2 21 11/13/2018 0937   BUN 16 11/13/2018 0937   CREATININE 1.26 11/13/2018 0937   CREATININE 1.20 (H) 05/20/2017 0821      Component Value Date/Time   CALCIUM 9.9 11/13/2018 0937   ALKPHOS 56 11/13/2018 0937   AST 17 11/13/2018 0937   ALT 20 11/13/2018 0937   BILITOT 0.8 11/13/2018 0937     Lab Results  Component Value Date   CHOL 128 11/13/2018   HDL 31 (L) 11/13/2018   LDLCALC 64 11/13/2018   TRIG 163 (H) 11/13/2018   CHOLHDL 4.1 11/13/2018    Assessment and Plan:  Essential hypertension, benign - elevated in office (done at lab visit); lower elsewhere. Not monitoring regularly. Low sodium diet, cont same meds. f/u 3 mos - Plan: amLODipine (NORVASC) 5 MG tablet  Mixed  hyperlipidemia - TG slightly elevated, likely related to high sugars. Cont same meds  Type 2 diabetes mellitus with hyperglycemia, with long-term current use of insulin (HCC) - discussed proper diet, monitoring, and the need to f/u with Dr. Buddy Duty.    Class 1 obesity with serious comorbidity and body mass index (BMI) of 31.0 to 31.9 in adult, unspecified obesity type  Poorly controlled diabetes mellitus ([place])   HTN--above goal today, possibly related to some dietary changes. Reviewed low Na diet.  Encouraged weight loss.  Encouraged that he monitor BP more regularly, to consider buying home monitor.  F/u with Dr. Buddy Duty re: poorly controlled DM. Encouraged him to monitor glucose regularly (helps keep accountable, and can recognize when running high consistently, to reach out to his endo).   Yearly diabetic eye exam is now due. Remind them to send report to our  office  F/u 3 months Bring monitor (if one is bought) , list of BP's to f/u visit.    Follow Up Instructions:  AVS was mailed to patient.  I discussed the assessment and treatment plan with the patient. The patient was provided an opportunity to ask questions and all were answered. The patient agreed with the plan and demonstrated an understanding of the instructions.   The patient was advised to call back or seek an in-person evaluation if the symptoms worsen or if the condition fails to improve as anticipated.  I provided 25 minutes of non-face-to-face time during this encounter.   Vikki Ports, MD

## 2018-11-15 ENCOUNTER — Other Ambulatory Visit: Payer: Self-pay

## 2018-11-15 ENCOUNTER — Ambulatory Visit (INDEPENDENT_AMBULATORY_CARE_PROVIDER_SITE_OTHER): Payer: Medicare Other | Admitting: Family Medicine

## 2018-11-15 ENCOUNTER — Encounter: Payer: Self-pay | Admitting: Family Medicine

## 2018-11-15 VITALS — BP 152/78 | HR 79 | Temp 98.5°F | Ht 68.5 in | Wt 211.0 lb

## 2018-11-15 DIAGNOSIS — E669 Obesity, unspecified: Secondary | ICD-10-CM | POA: Diagnosis not present

## 2018-11-15 DIAGNOSIS — Z6831 Body mass index (BMI) 31.0-31.9, adult: Secondary | ICD-10-CM | POA: Diagnosis not present

## 2018-11-15 DIAGNOSIS — E782 Mixed hyperlipidemia: Secondary | ICD-10-CM

## 2018-11-15 DIAGNOSIS — Z794 Long term (current) use of insulin: Secondary | ICD-10-CM | POA: Diagnosis not present

## 2018-11-15 DIAGNOSIS — E1165 Type 2 diabetes mellitus with hyperglycemia: Secondary | ICD-10-CM

## 2018-11-15 DIAGNOSIS — I10 Essential (primary) hypertension: Secondary | ICD-10-CM

## 2018-11-15 MED ORDER — AMLODIPINE BESYLATE 5 MG PO TABS: 5.0000 mg | ORAL_TABLET | Freq: Every day | ORAL | 1 refills | Status: DC

## 2018-11-15 NOTE — Progress Notes (Signed)
FAX of lab  Results sent electronically.

## 2018-11-15 NOTE — Patient Instructions (Addendum)
Your blood pressure was elevated at your recent visit.  Please try and check your blood pressure more frequently, so that we can better assess whether or not your medications need to be adjusted.  Goal blood pressure is 130/80 or less. I recommend purchasing a blood pressure cuff to make it easier for you to monitor at home.  If you do buy one, please bring it with you to your next visit so that we can verify the accuracy of the numbers (you can also always schedule a nurse visit to check the monitor, rather than waiting for your next visit).  Please limit the sodium in the diet, continue to get regular exercise, and work hard on trying to lose some of the weight from your waist/abdomen.  Your diabetes is not well controlled. We sent your labs to Dr. Buddy Duty.  Please call his office to schedule follow-up.  Also call to schedule your diabetes eye exam. Please start checking your blood sugars more regularly.  Having these values can help determine what foods you may be eating that may be contributing to the higher sugars.  If you can eliminate/cut back on those foods, perhaps you won't need as much insulin to keep the sugars down.  It is a good feedback to have--if you numbers are running high, trying to make adjustments in your diet/exercise to get them down, and if they remain high, contacting Dr. Buddy Duty for adjustments in your medication.  We will see you back in 3 months to re-address your blood pressure.  Please bring a list of all blood pressure checked elsewhere, and bring your monitor if you purchase one.  Stay well!   Low-Sodium Eating Plan Sodium, which is an element that makes up salt, helps you maintain a healthy balance of fluids in your body. Too much sodium can increase your blood pressure and cause fluid and waste to be held in your body. Your health care provider or dietitian may recommend following this plan if you have high blood pressure (hypertension), kidney disease, liver disease, or  heart failure. Eating less sodium can help lower your blood pressure, reduce swelling, and protect your heart, liver, and kidneys. What are tips for following this plan? General guidelines  Most people on this plan should limit their sodium intake to 1,500-2,000 mg (milligrams) of sodium each day. Reading food labels   The Nutrition Facts label lists the amount of sodium in one serving of the food. If you eat more than one serving, you must multiply the listed amount of sodium by the number of servings.  Choose foods with less than 140 mg of sodium per serving.  Avoid foods with 300 mg of sodium or more per serving. Shopping  Look for lower-sodium products, often labeled as "low-sodium" or "no salt added."  Always check the sodium content even if foods are labeled as "unsalted" or "no salt added".  Buy fresh foods. ? Avoid canned foods and premade or frozen meals. ? Avoid canned, cured, or processed meats  Buy breads that have less than 80 mg of sodium per slice. Cooking  Eat more home-cooked food and less restaurant, buffet, and fast food.  Avoid adding salt when cooking. Use salt-free seasonings or herbs instead of table salt or sea salt. Check with your health care provider or pharmacist before using salt substitutes.  Cook with plant-based oils, such as canola, sunflower, or olive oil. Meal planning  When eating at a restaurant, ask that your food be prepared with less salt  or no salt, if possible.  Avoid foods that contain MSG (monosodium glutamate). MSG is sometimes added to [place] food, bouillon, and some canned foods. What foods are recommended? The items listed may not be a complete list. Talk with your dietitian about what dietary choices are best for you. Grains Low-sodium cereals, including oats, puffed wheat and rice, and shredded wheat. Low-sodium crackers. Unsalted rice. Unsalted pasta. Low-sodium bread. Whole-grain breads and whole-grain  pasta. Vegetables Fresh or frozen vegetables. "No salt added" canned vegetables. "No salt added" tomato sauce and paste. Low-sodium or reduced-sodium tomato and vegetable juice. Fruits Fresh, frozen, or canned fruit. Fruit juice. Meats and other protein foods Fresh or frozen (no salt added) meat, poultry, seafood, and fish. Low-sodium canned tuna and salmon. Unsalted nuts. Dried peas, beans, and lentils without added salt. Unsalted canned beans. Eggs. Unsalted nut butters. Dairy Milk. Soy milk. Cheese that is naturally low in sodium, such as ricotta cheese, fresh mozzarella, or Swiss cheese Low-sodium or reduced-sodium cheese. Cream cheese. Yogurt. Fats and oils Unsalted butter. Unsalted margarine with no trans fat. Vegetable oils such as canola or olive oils. Seasonings and other foods Fresh and dried herbs and spices. Salt-free seasonings. Low-sodium mustard and ketchup. Sodium-free salad dressing. Sodium-free light mayonnaise. Fresh or refrigerated horseradish. Lemon juice. Vinegar. Homemade, reduced-sodium, or low-sodium soups. Unsalted popcorn and pretzels. Low-salt or salt-free chips. What foods are not recommended? The items listed may not be a complete list. Talk with your dietitian about what dietary choices are best for you. Grains Instant hot cereals. Bread stuffing, pancake, and biscuit mixes. Croutons. Seasoned rice or pasta mixes. Noodle soup cups. Boxed or frozen macaroni and cheese. Regular salted crackers. Self-rising flour. Vegetables Sauerkraut, pickled vegetables, and relishes. Olives. [place] fries. Onion rings. Regular canned vegetables (not low-sodium or reduced-sodium). Regular canned tomato sauce and paste (not low-sodium or reduced-sodium). Regular tomato and vegetable juice (not low-sodium or reduced-sodium). Frozen vegetables in sauces. Meats and other protein foods Meat or fish that is salted, canned, smoked, spiced, or pickled. Bacon, ham, sausage, hotdogs, corned  beef, chipped beef, packaged lunch meats, salt pork, jerky, pickled herring, anchovies, regular canned tuna, sardines, salted nuts. Dairy Processed cheese and cheese spreads. Cheese curds. Blue cheese. Feta cheese. String cheese. Regular cottage cheese. Buttermilk. Canned milk. Fats and oils Salted butter. Regular margarine. Ghee. Bacon fat. Seasonings and other foods Onion salt, garlic salt, seasoned salt, table salt, and sea salt. Canned and packaged gravies. Worcestershire sauce. Tartar sauce. Barbecue sauce. Teriyaki sauce. Soy sauce, including reduced-sodium. Steak sauce. Fish sauce. Oyster sauce. Cocktail sauce. Horseradish that you find on the shelf. Regular ketchup and mustard. Meat flavorings and tenderizers. Bouillon cubes. Hot sauce and Tabasco sauce. Premade or packaged marinades. Premade or packaged taco seasonings. Relishes. Regular salad dressings. Salsa. Potato and tortilla chips. Corn chips and puffs. Salted popcorn and pretzels. Canned or dried soups. Pizza. Frozen entrees and pot pies. Summary  Eating less sodium can help lower your blood pressure, reduce swelling, and protect your heart, liver, and kidneys.  Most people on this plan should limit their sodium intake to 1,500-2,000 mg (milligrams) of sodium each day.  Canned, boxed, and frozen foods are high in sodium. Restaurant foods, fast foods, and pizza are also very high in sodium. You also get sodium by adding salt to food.  Try to cook at home, eat more fresh fruits and vegetables, and eat less fast food, canned, processed, or prepared foods. This information is not intended to replace advice given to  you by your health care provider. Make sure you discuss any questions you have with your health care provider. Document Released: 11/13/2001 Document Revised: 05/17/2016 Document Reviewed: 05/17/2016 Elsevier Interactive Patient Education  2019 [employer]   Preventing Diabetes Mellitus Complications You can take  action to prevent or slow down problems that are caused by diabetes (diabetes mellitus). Following your diabetes plan and taking care of yourself can reduce your risk of serious or life-threatening complications. What actions can I take to prevent diabetes complications? Manage your diabetes   Follow instructions from your health care providers about managing your diabetes. Your diabetes may be managed by a team of health care providers who can teach you how to care for yourself and can answer questions that you have.  Educate yourself about your condition so you can make healthy choices about eating and physical activity.  Check your blood sugar (glucose) levels as often as directed. Your health care provider will help you decide how often to check your blood glucose level depending on your treatment goals and how well you are meeting them.  Ask your health care provider if you should take low-dose aspirin daily and what dose is recommended for you. Taking low-dose aspirin daily is recommended to help prevent cardiovascular disease. Do not use nicotine or tobacco Do not use any products that contain nicotine or tobacco, such as cigarettes and e-cigarettes. If you need help quitting, ask your health care provider. Nicotine raises your risk for diabetes problems. If you quit using nicotine:  You will lower your risk for heart attack, stroke, nerve disease, and kidney disease.  Your cholesterol and blood pressure may improve.  Your blood circulation will improve. Keep your blood pressure under control Your personal target blood pressure is determined based on:  Your age.  Your medicines.  How long you have had diabetes.  Any other medical conditions you have. To control your blood pressure:  Follow instructions from your health care provider about meal planning, exercise, and medicines.  Make sure your health care provider checks your blood pressure at every medical visit.  Monitor  your blood pressure at home as told by your health care provider.  Keep your cholesterol under control To control your cholesterol:  Follow instructions from your health care provider about meal planning, exercise, and medicines.  Have your cholesterol checked at least once a year.  You may be prescribed medicine to lower cholesterol (statin). If you are not taking a statin, ask your health care provider if you should be. Controlling your cholesterol may:  Help prevent heart disease and stroke. These are the most common health problems for people with diabetes.  Improve your blood flow. Schedule and keep yearly physical exams and eye exams Your health care provider will tell you how often you need medical visits depending on your diabetes management plan. Keep all follow-up visits as directed. This is important so possible problems can be identified early and complications can be avoided or treated.  Every visit with your health care provider should include measuring your: ? Weight. ? Blood pressure. ? Blood glucose control.  Your A1c (hemoglobin A1c) level should be checked: ? At least 2 times a year, if you are meeting your treatment goals. ? 4 times a year, if you are not meeting treatment goals or if your treatment goals have changed.  Your blood lipids (lipid profile) should be checked yearly. You should also be checked yearly for protein in your urine (urine microalbumin).  If  you have type 1 diabetes, get an eye exam 3-5 years after you are diagnosed, and then once a year after your first exam.  If you have type 2 diabetes, get an eye exam as soon as you are diagnosed, and then once a year after your first exam. Keep your vaccines current It is recommended that you receive:  A flu (influenza) vaccine every year.  A pneumonia (pneumococcal) vaccine and a hepatitis B vaccine. If you are age 50 or older, you may get the pneumonia vaccine as a series of two separate  shots. Ask your health care provider which other vaccines may be recommended. Take care of your feet Diabetes may cause you to have poor blood circulation to your legs and feet. Because of this, taking care of your feet is very important. Diabetes can cause:  The skin on the feet to get thinner, break more easily, and heal more slowly.  Nerve damage in your legs and feet, which results in decreased feeling. You may not notice minor injuries that could lead to serious problems. To avoid foot problems:  Check your skin and feet every day for cuts, bruises, redness, blisters, or sores.  Schedule a foot exam with your health care provider once every year. This exam includes: ? Inspecting of the structure and skin of your feet. ? Checking the pulses and sensation in your feet.  Make sure that your health care provider performs a visual foot exam at every medical visit.  Take care of your teeth People with poorly controlled diabetes are more likely to have gum (periodontal) disease. Diabetes can make periodontal diseases harder to control. If not treated, periodontal diseases can lead to tooth loss. To prevent this:  Brush your teeth twice a day.  Floss at least once a day.  Visit your dentist 2 times a year. Drink responsibly Limit alcohol intake to no more than 1 drink a day for nonpregnant women and 2 drinks a day for men. One drink equals 12 oz of beer, 5 oz of wine, or 1 oz of hard liquor.  It is important to eat food when you drink alcohol to avoid low blood glucose (hypoglycemia). Avoid alcohol if you:  Have a history of alcohol abuse or dependence.  Are pregnant.  Have liver disease, pancreatitis, advanced neuropathy, or severe hypertriglyceridemia. Lessen stress Living with diabetes can be stressful. When you are experiencing stress, your blood glucose may be affected in two ways:  Stress hormones may cause your blood glucose to rise.  You may be distracted from taking  good care of yourself. Be aware of your stress level and make changes to help you manage challenging situations. To lower your stress levels:  Consider joining a support group.  Do planned relaxation or meditation.  Do a hobby that you enjoy.  Maintain healthy relationships.  Exercise regularly.  Work with your health care provider or a mental health professional. Summary  You can take action to prevent or slow down problems that are caused by diabetes (diabetes mellitus). Following your diabetes plan and taking care of yourself can reduce your risk of serious or life-threatening complications.  Follow instructions from your health care providers about managing your diabetes. Your diabetes may be managed by a team of health care providers who can teach you how to care for yourself and can answer questions that you have.  Your health care provider will tell you how often you need medical visits depending on your diabetes management plan. Keep all  follow-up visits as directed. This is important so possible problems can be identified early and complications can be avoided or treated. This information is not intended to replace advice given to you by your health care provider. Make sure you discuss any questions you have with your health care provider. Document Released: 02/09/2011 Document Revised: 01/11/2017 Document Reviewed: 02/21/2016 Elsevier Interactive Patient Education  2019 [employer].

## 2018-11-15 NOTE — Progress Notes (Signed)
Done pt is coming in Sept 24 but pt wants to know if he needs blood work , states he is a diabetic and can not wait until a 9.15 appt  To have labs done

## 2018-11-16 NOTE — Progress Notes (Signed)
Called and informed pt.  

## 2018-11-16 NOTE — Progress Notes (Signed)
Informed pt .

## 2018-12-12 ENCOUNTER — Other Ambulatory Visit: Payer: Self-pay | Admitting: Family Medicine

## 2018-12-12 DIAGNOSIS — E782 Mixed hyperlipidemia: Secondary | ICD-10-CM

## 2018-12-13 ENCOUNTER — Other Ambulatory Visit: Payer: Self-pay

## 2018-12-13 ENCOUNTER — Encounter: Payer: Self-pay | Admitting: Podiatry

## 2018-12-13 ENCOUNTER — Ambulatory Visit (INDEPENDENT_AMBULATORY_CARE_PROVIDER_SITE_OTHER): Payer: Medicare Other | Admitting: Podiatry

## 2018-12-13 DIAGNOSIS — Z794 Long term (current) use of insulin: Secondary | ICD-10-CM

## 2018-12-13 DIAGNOSIS — M79674 Pain in right toe(s): Secondary | ICD-10-CM | POA: Diagnosis not present

## 2018-12-13 DIAGNOSIS — M79675 Pain in left toe(s): Secondary | ICD-10-CM

## 2018-12-13 DIAGNOSIS — E1165 Type 2 diabetes mellitus with hyperglycemia: Secondary | ICD-10-CM

## 2018-12-13 DIAGNOSIS — B351 Tinea unguium: Secondary | ICD-10-CM | POA: Diagnosis not present

## 2018-12-13 NOTE — Progress Notes (Signed)
Patient ID: [name], male   DOB: [birth year], 73 y.o.   MRN: [id number] Complaint:  Visit Type: Patient returns to my office for continued preventative foot care services. Complaint: Patient states" my nails have grown long and thick and become painful to walk and wear shoes" Patient has been diagnosed with DM with no foot complications. The patient presents for preventative foot care services. No changes to ROS  Podiatric Exam: Vascular: dorsalis pedis and posterior tibial pulses are palpable bilateral. Capillary return is immediate. Temperature gradient is WNL. Skin turgor WNL  Sensorium: Normal Semmes Weinstein monofilament test. Normal tactile sensation bilaterally. Nail Exam: Pt has thick disfigured discolored nails with subungual debris noted bilateral entire nail hallux through fifth toenails Ulcer Exam: There is no evidence of ulcer or pre-ulcerative changes or infection. Orthopedic Exam: Muscle tone and strength are WNL. No limitations in general ROM. No crepitus or effusions noted. Foot type and digits show no abnormalities. Bony prominences are unremarkable. Skin: No Porokeratosis. No infection or ulcers  Diagnosis:  Onychomycosis, , Pain in right toe, pain in left toes  Treatment & Plan Procedures and Treatment: Consent by patient was obtained for treatment procedures. The patient understood the discussion of treatment and procedures well. All questions were answered thoroughly reviewed. Debridement of mycotic and hypertrophic toenails, 1 through 5 bilateral and clearing of subungual debris. No ulceration, no infection noted.   Return Visit-Office Procedure: Patient instructed to return to the office for a follow up visit 9 weeks  for continued evaluation and treatment.  Gardiner Barefoot DPM

## 2019-01-16 DIAGNOSIS — E109 Type 1 diabetes mellitus without complications: Secondary | ICD-10-CM | POA: Diagnosis not present

## 2019-01-16 LAB — HM DIABETES EYE EXAM

## 2019-02-09 ENCOUNTER — Other Ambulatory Visit: Payer: Self-pay | Admitting: Family Medicine

## 2019-02-09 DIAGNOSIS — I10 Essential (primary) hypertension: Secondary | ICD-10-CM

## 2019-02-09 DIAGNOSIS — E782 Mixed hyperlipidemia: Secondary | ICD-10-CM

## 2019-02-09 DIAGNOSIS — E1165 Type 2 diabetes mellitus with hyperglycemia: Secondary | ICD-10-CM

## 2019-02-09 DIAGNOSIS — E7800 Pure hypercholesterolemia, unspecified: Secondary | ICD-10-CM

## 2019-02-16 DIAGNOSIS — H1045 Other chronic allergic conjunctivitis: Secondary | ICD-10-CM | POA: Diagnosis not present

## 2019-02-21 ENCOUNTER — Ambulatory Visit: Payer: Medicare Other | Admitting: Podiatry

## 2019-02-21 DIAGNOSIS — Z23 Encounter for immunization: Secondary | ICD-10-CM | POA: Diagnosis not present

## 2019-02-21 DIAGNOSIS — H15113 Episcleritis periodica fugax, bilateral: Secondary | ICD-10-CM | POA: Diagnosis not present

## 2019-02-22 ENCOUNTER — Encounter: Payer: Self-pay | Admitting: *Deleted

## 2019-02-22 ENCOUNTER — Telehealth: Payer: Self-pay | Admitting: *Deleted

## 2019-02-22 MED ORDER — SILDENAFIL CITRATE 20 MG PO TABS: 20.0000 mg | ORAL_TABLET | Freq: Three times a day (TID) | ORAL | 0 refills | Status: DC

## 2019-02-22 NOTE — Telephone Encounter (Signed)
Refill done.  

## 2019-02-22 NOTE — Telephone Encounter (Signed)
Called to confirm med check for next Thursday and patient cancelled as he said he would be out of town. He could not reschedule at the time, said he will call me when he gets back into town. He did get a bp machine and will bring to the next visit. He asked for a refill on sildenafil 20mg  to please be sent to [facility]. Thanks.

## 2019-03-01 ENCOUNTER — Encounter: Payer: Medicare Other | Admitting: Family Medicine

## 2019-03-02 ENCOUNTER — Ambulatory Visit (INDEPENDENT_AMBULATORY_CARE_PROVIDER_SITE_OTHER): Payer: Medicare Other | Admitting: Podiatry

## 2019-03-02 ENCOUNTER — Other Ambulatory Visit: Payer: Self-pay

## 2019-03-02 ENCOUNTER — Encounter: Payer: Self-pay | Admitting: Podiatry

## 2019-03-02 DIAGNOSIS — B351 Tinea unguium: Secondary | ICD-10-CM | POA: Diagnosis not present

## 2019-03-02 DIAGNOSIS — M79675 Pain in left toe(s): Secondary | ICD-10-CM

## 2019-03-02 DIAGNOSIS — E1165 Type 2 diabetes mellitus with hyperglycemia: Secondary | ICD-10-CM

## 2019-03-02 DIAGNOSIS — M79674 Pain in right toe(s): Secondary | ICD-10-CM | POA: Diagnosis not present

## 2019-03-02 DIAGNOSIS — Z794 Long term (current) use of insulin: Secondary | ICD-10-CM | POA: Diagnosis not present

## 2019-03-02 NOTE — Progress Notes (Signed)
Patient ID: [name], male   DOB: 08/20/1945, 73 y.o.   MRN: [id number] Complaint:  Visit Type: Patient returns to my office for continued preventative foot care services. Complaint: Patient states" my nails have grown long and thick and become painful to walk and wear shoes" Patient has been diagnosed with DM with no foot complications. The patient presents for preventative foot care services. No changes to ROS  Podiatric Exam: Vascular: dorsalis pedis and posterior tibial pulses are palpable bilateral. Capillary return is immediate. Temperature gradient is WNL. Skin turgor WNL  Sensorium: Normal Semmes Weinstein monofilament test. Normal tactile sensation bilaterally. Nail Exam: Pt has thick disfigured discolored nails with subungual debris noted bilateral entire nail hallux through fifth toenails Ulcer Exam: There is no evidence of ulcer or pre-ulcerative changes or infection. Orthopedic Exam: Muscle tone and strength are WNL. No limitations in general ROM. No crepitus or effusions noted. Foot type and digits show no abnormalities. Bony prominences are unremarkable. Skin: No Porokeratosis. No infection or ulcers  Diagnosis:  Onychomycosis, , Pain in right toe, pain in left toes  Treatment & Plan Procedures and Treatment: Consent by patient was obtained for treatment procedures. The patient understood the discussion of treatment and procedures well. All questions were answered thoroughly reviewed. Debridement of mycotic and hypertrophic toenails, 1 through 5 bilateral and clearing of subungual debris. No ulceration, no infection noted.   Return Visit-Office Procedure: Patient instructed to return to the office for a follow up visit 9 weeks  for continued evaluation and treatment.  Faline Langer DPM 

## 2019-03-14 ENCOUNTER — Other Ambulatory Visit: Payer: Self-pay | Admitting: Family Medicine

## 2019-03-14 DIAGNOSIS — E782 Mixed hyperlipidemia: Secondary | ICD-10-CM

## 2019-04-11 DIAGNOSIS — H16223 Keratoconjunctivitis sicca, not specified as Sjogren's, bilateral: Secondary | ICD-10-CM | POA: Diagnosis not present

## 2019-04-17 DIAGNOSIS — Z794 Long term (current) use of insulin: Secondary | ICD-10-CM | POA: Diagnosis not present

## 2019-04-17 DIAGNOSIS — E1165 Type 2 diabetes mellitus with hyperglycemia: Secondary | ICD-10-CM | POA: Diagnosis not present

## 2019-04-17 DIAGNOSIS — E669 Obesity, unspecified: Secondary | ICD-10-CM | POA: Diagnosis not present

## 2019-04-17 LAB — MICROALBUMIN, URINE: Microalb, Ur: 24.4

## 2019-04-17 LAB — HEMOGLOBIN A1C: Hemoglobin A1C: 8.5

## 2019-04-17 LAB — HM DIABETES FOOT EXAM: HM Diabetic Foot Exam: NORMAL

## 2019-04-25 DIAGNOSIS — H16223 Keratoconjunctivitis sicca, not specified as Sjogren's, bilateral: Secondary | ICD-10-CM | POA: Diagnosis not present

## 2019-05-09 ENCOUNTER — Encounter: Payer: Self-pay | Admitting: Podiatry

## 2019-05-09 ENCOUNTER — Ambulatory Visit (INDEPENDENT_AMBULATORY_CARE_PROVIDER_SITE_OTHER): Payer: Medicare Other | Admitting: Podiatry

## 2019-05-09 ENCOUNTER — Other Ambulatory Visit: Payer: Self-pay

## 2019-05-09 DIAGNOSIS — B351 Tinea unguium: Secondary | ICD-10-CM

## 2019-05-09 DIAGNOSIS — E1165 Type 2 diabetes mellitus with hyperglycemia: Secondary | ICD-10-CM

## 2019-05-09 DIAGNOSIS — M79674 Pain in right toe(s): Secondary | ICD-10-CM | POA: Diagnosis not present

## 2019-05-09 DIAGNOSIS — M79675 Pain in left toe(s): Secondary | ICD-10-CM

## 2019-05-09 DIAGNOSIS — Z794 Long term (current) use of insulin: Secondary | ICD-10-CM | POA: Diagnosis not present

## 2019-05-09 NOTE — Progress Notes (Signed)
Patient ID: [name], male   DOB: 1946/04/07, 73 y.o.   MRN: [id number] Complaint:  Visit Type: Patient returns to my office for continued preventative foot care services. Complaint: Patient states" my nails have grown long and thick and become painful to walk and wear shoes" Patient has been diagnosed with DM with no foot complications. The patient presents for preventative foot care services. No changes to ROS  Podiatric Exam: Vascular: dorsalis pedis and posterior tibial pulses are palpable bilateral. Capillary return is immediate. Temperature gradient is WNL. Skin turgor WNL  Sensorium: Normal Semmes Weinstein monofilament test. Normal tactile sensation bilaterally. Nail Exam: Pt has thick disfigured discolored nails with subungual debris noted bilateral entire nail hallux through fifth toenails Ulcer Exam: There is no evidence of ulcer or pre-ulcerative changes or infection. Orthopedic Exam: Muscle tone and strength are WNL. No limitations in general ROM. No crepitus or effusions noted. Foot type and digits show no abnormalities. Bony prominences are unremarkable. Skin: No Porokeratosis. No infection or ulcers  Diagnosis:  Onychomycosis, , Pain in right toe, pain in left toes  Treatment & Plan Procedures and Treatment: Consent by patient was obtained for treatment procedures. The patient understood the discussion of treatment and procedures well. All questions were answered thoroughly reviewed. Debridement of mycotic and hypertrophic toenails, 1 through 5 bilateral and clearing of subungual debris. No ulceration, no infection noted.   Return Visit-Office Procedure: Patient instructed to return to the office for a follow up visit 9 weeks  for continued evaluation and treatment.  Gardiner Barefoot DPM

## 2019-05-12 ENCOUNTER — Other Ambulatory Visit: Payer: Self-pay | Admitting: Family Medicine

## 2019-05-12 DIAGNOSIS — I10 Essential (primary) hypertension: Secondary | ICD-10-CM

## 2019-05-12 DIAGNOSIS — E7800 Pure hypercholesterolemia, unspecified: Secondary | ICD-10-CM

## 2019-05-12 DIAGNOSIS — Z794 Long term (current) use of insulin: Secondary | ICD-10-CM

## 2019-05-12 DIAGNOSIS — E1165 Type 2 diabetes mellitus with hyperglycemia: Secondary | ICD-10-CM

## 2019-05-12 DIAGNOSIS — E782 Mixed hyperlipidemia: Secondary | ICD-10-CM

## 2019-06-01 IMAGING — US US SOFT TISSUE HEAD/NECK
1 series · 14 of 14 positions shown · non-contrast
Comparison: None.

CLINICAL DATA: Neck mass.

EXAM:
ULTRASOUND OF HEAD/NECK SOFT TISSUES
TECHNIQUE: Ultrasound examination of the head and neck soft tissues was
performed in the area of clinical concern.

[Series 1: us soft tissue head/neck · 0.06mm/px · 14 acquisitions, 14 frames shown]
[im 1/14]
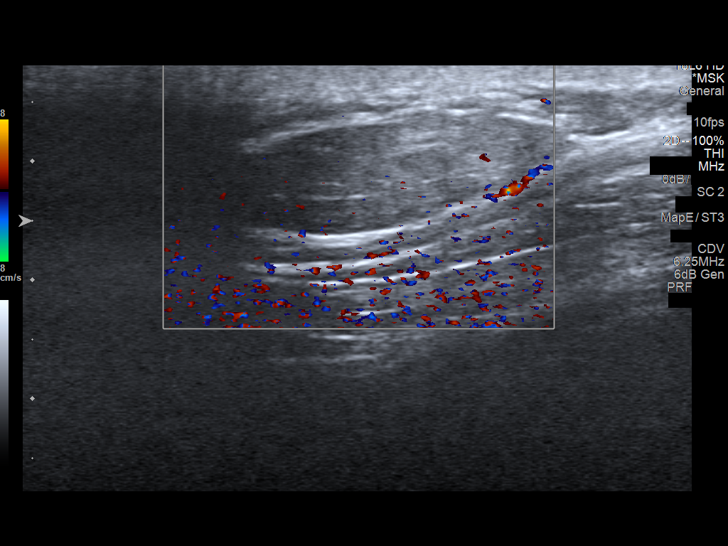
[im 2/14]
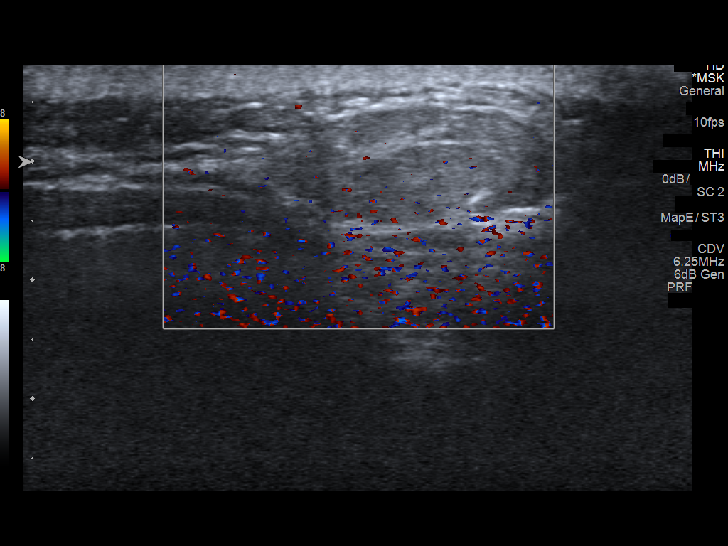
[im 3/14]
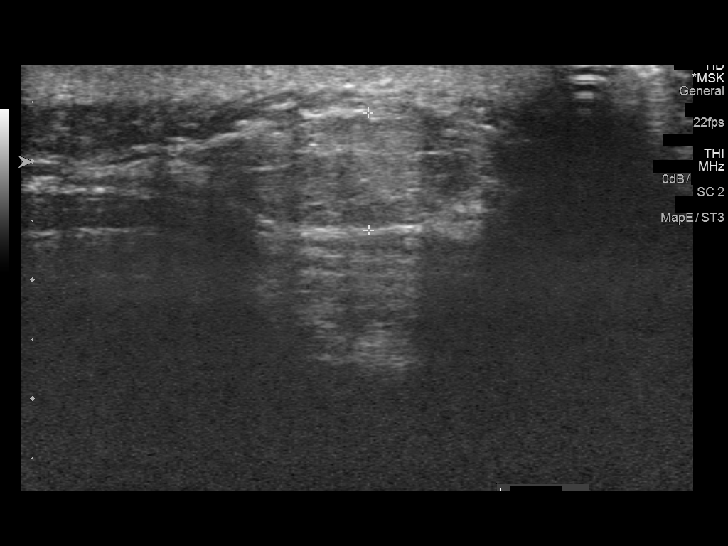
[im 4/14]
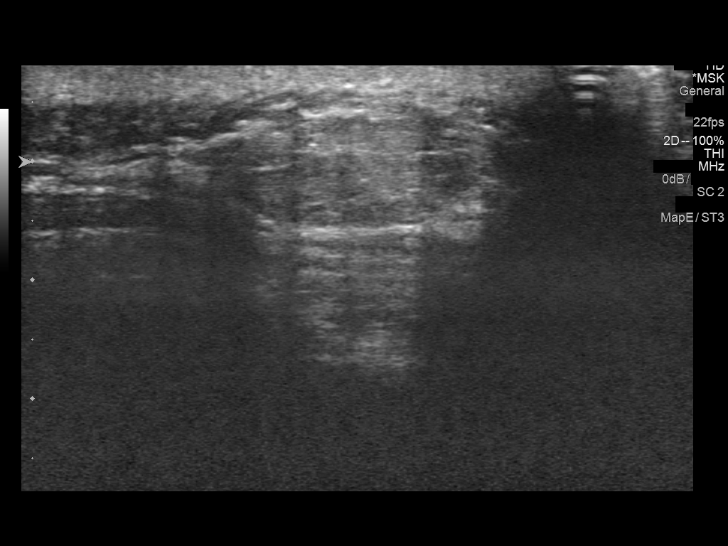
[im 5/14]
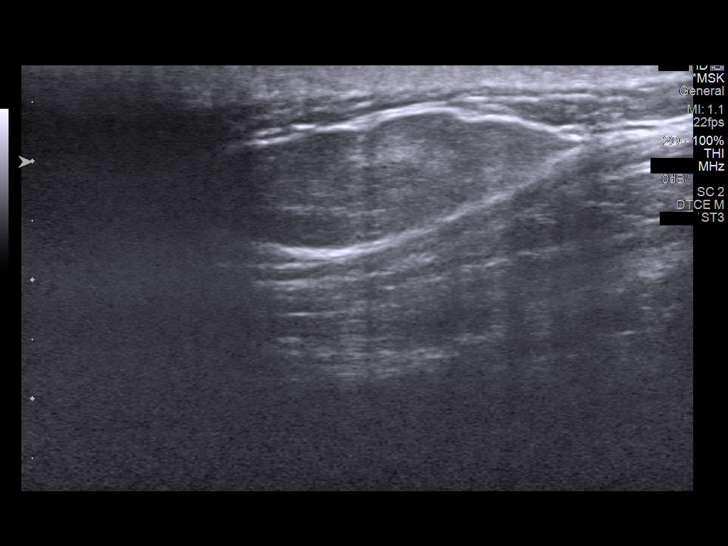
[im 6/14]
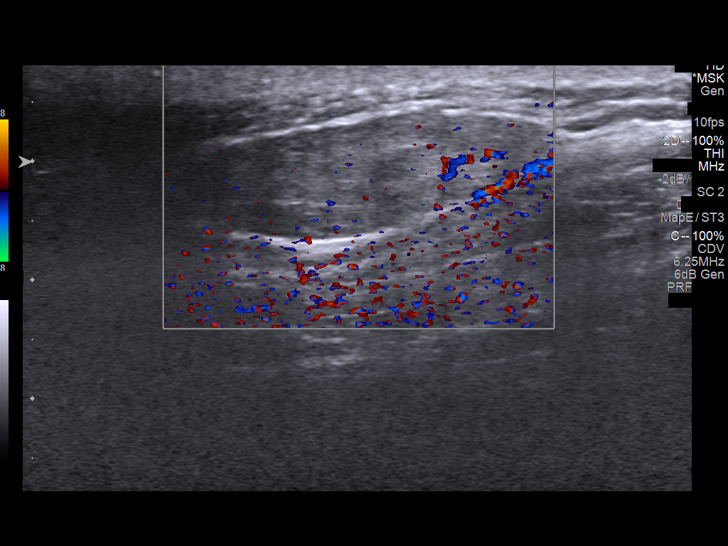
[im 7/14]
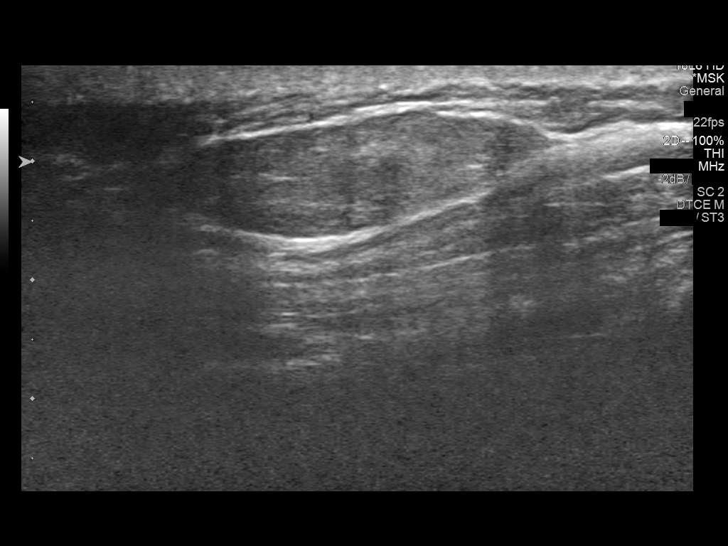
[im 8/14]
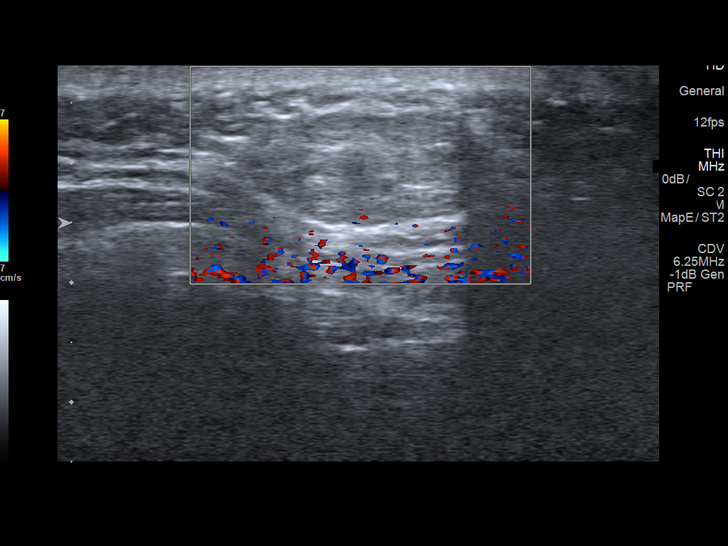
[im 9/14]
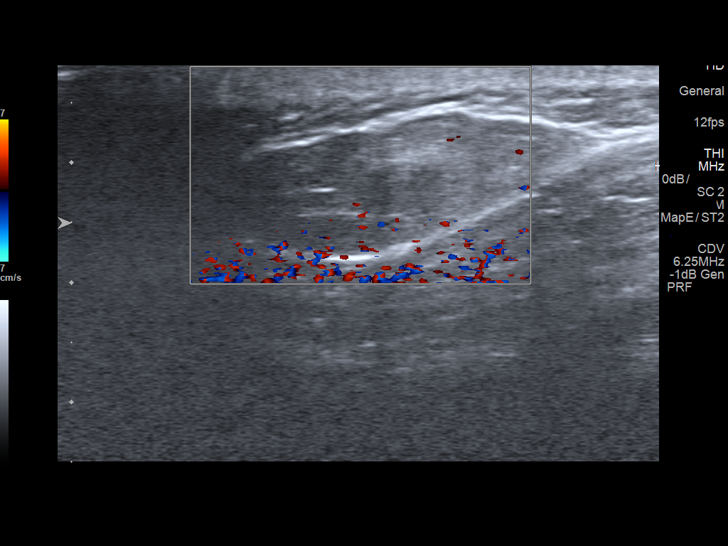
[im 10/14]
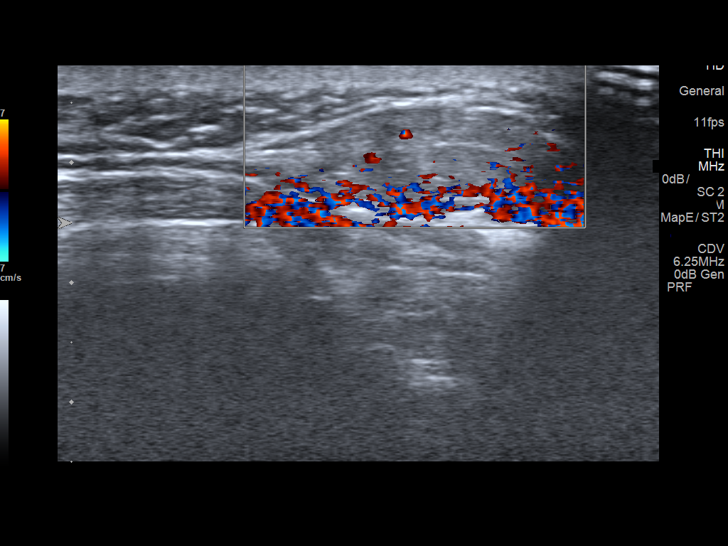
[im 11/14]
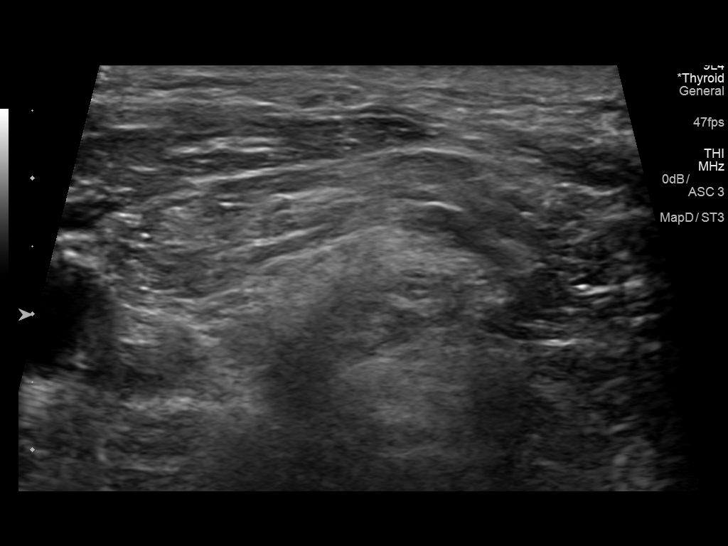
[im 12/14]
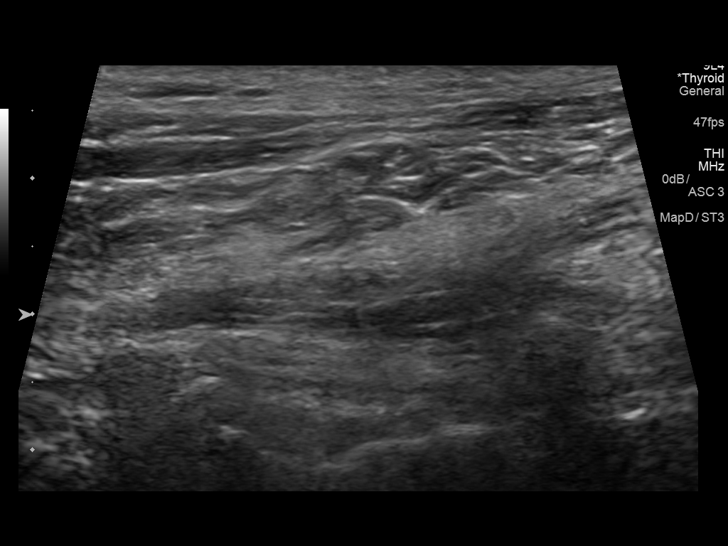
[im 13/14]
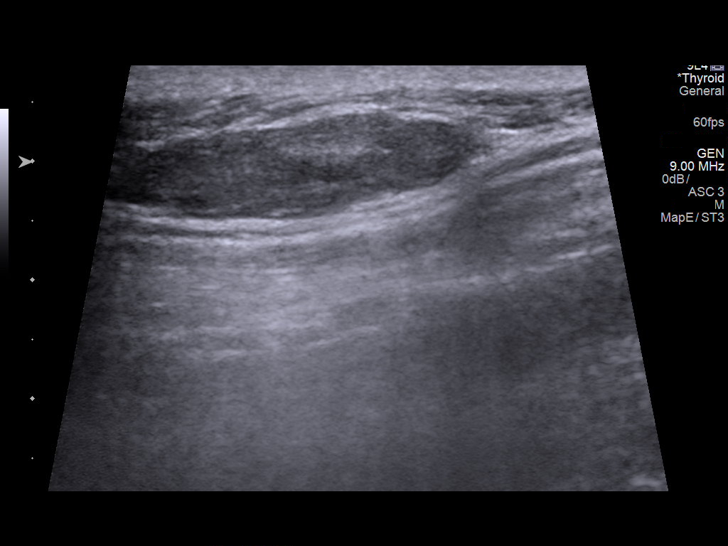
[im 14/14]
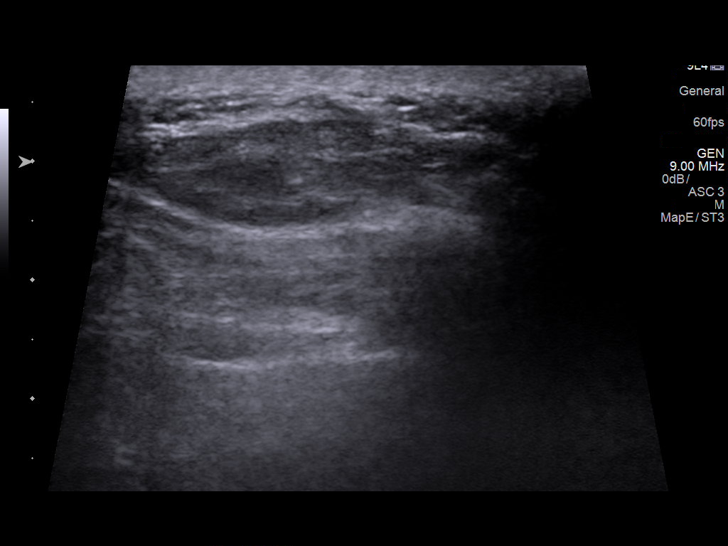

[14 of 14 positions shown; findings below may reference images not displayed]

FINDINGS: Subcutaneous ovoid mass with homogeneous internal architecture
measuring 1 cm in thickness by 3 cm in length. No hilar fat or
vessel is seen. No cavitation.
IMPRESSION: 3 x 1 cm ovoid subcutaneous mass in the left occipital neck. A
lipoma would have this appearance, but this would not explain a
three-week history of symptoms. If there is unexpected persistence
or progression, repeat imaging or biopsy is recommended.

## 2019-06-08 HISTORY — PX: OTHER SURGICAL HISTORY: SHX169

## 2019-06-20 ENCOUNTER — Other Ambulatory Visit: Payer: Self-pay | Admitting: Family Medicine

## 2019-06-20 DIAGNOSIS — I10 Essential (primary) hypertension: Secondary | ICD-10-CM

## 2019-07-04 DIAGNOSIS — H11823 Conjunctivochalasis, bilateral: Secondary | ICD-10-CM | POA: Diagnosis not present

## 2019-07-04 DIAGNOSIS — H0102A Squamous blepharitis right eye, upper and lower eyelids: Secondary | ICD-10-CM | POA: Diagnosis not present

## 2019-07-04 DIAGNOSIS — H0102B Squamous blepharitis left eye, upper and lower eyelids: Secondary | ICD-10-CM | POA: Diagnosis not present

## 2019-07-04 DIAGNOSIS — H04123 Dry eye syndrome of bilateral lacrimal glands: Secondary | ICD-10-CM | POA: Diagnosis not present

## 2019-07-13 ENCOUNTER — Other Ambulatory Visit: Payer: Self-pay

## 2019-07-13 ENCOUNTER — Ambulatory Visit (INDEPENDENT_AMBULATORY_CARE_PROVIDER_SITE_OTHER): Payer: Medicare Other | Admitting: Podiatry

## 2019-07-13 ENCOUNTER — Encounter: Payer: Self-pay | Admitting: Podiatry

## 2019-07-13 DIAGNOSIS — B351 Tinea unguium: Secondary | ICD-10-CM | POA: Diagnosis not present

## 2019-07-13 DIAGNOSIS — M79675 Pain in left toe(s): Secondary | ICD-10-CM

## 2019-07-13 DIAGNOSIS — M79674 Pain in right toe(s): Secondary | ICD-10-CM | POA: Diagnosis not present

## 2019-07-13 DIAGNOSIS — E1165 Type 2 diabetes mellitus with hyperglycemia: Secondary | ICD-10-CM

## 2019-07-13 DIAGNOSIS — Z794 Long term (current) use of insulin: Secondary | ICD-10-CM

## 2019-07-13 NOTE — Progress Notes (Signed)
Patient ID: [name], male   DOB: Feb 28, 1946, 74 y.o.   MRN: [id number] Complaint:  Visit Type: Patient returns to my office for continued preventative foot care services. Complaint: Patient states" my nails have grown long and thick and become painful to walk and wear shoes" Patient has been diagnosed with DM with no foot complications. The patient presents for preventative foot care services. No changes to ROS  Podiatric Exam: Vascular: dorsalis pedis and posterior tibial pulses are palpable bilateral. Capillary return is immediate. Temperature gradient is WNL. Skin turgor WNL  Sensorium: Normal Semmes Weinstein monofilament test. Normal tactile sensation bilaterally. Nail Exam: Pt has thick disfigured discolored nails with subungual debris noted bilateral entire nail hallux through fifth toenails Ulcer Exam: There is no evidence of ulcer or pre-ulcerative changes or infection. Orthopedic Exam: Muscle tone and strength are WNL. No limitations in general ROM. No crepitus or effusions noted. Foot type and digits show no abnormalities. Bony prominences are unremarkable. Skin: No Porokeratosis. No infection or ulcers  Diagnosis:  Onychomycosis, , Pain in right toe, pain in left toes  Treatment & Plan Procedures and Treatment: Consent by patient was obtained for treatment procedures. The patient understood the discussion of treatment and procedures well. All questions were answered thoroughly reviewed. Debridement of mycotic and hypertrophic toenails, 1 through 5 bilateral and clearing of subungual debris. No ulceration, no infection noted.   Return Visit-Office Procedure: Patient instructed to return to the office for a follow up visit 9 weeks  for continued evaluation and treatment.  Gardiner Barefoot DPM

## 2019-07-18 DIAGNOSIS — E669 Obesity, unspecified: Secondary | ICD-10-CM | POA: Diagnosis not present

## 2019-07-18 DIAGNOSIS — Z7189 Other specified counseling: Secondary | ICD-10-CM | POA: Diagnosis not present

## 2019-07-18 DIAGNOSIS — Z794 Long term (current) use of insulin: Secondary | ICD-10-CM | POA: Diagnosis not present

## 2019-07-18 DIAGNOSIS — E1165 Type 2 diabetes mellitus with hyperglycemia: Secondary | ICD-10-CM | POA: Diagnosis not present

## 2019-07-18 NOTE — Progress Notes (Signed)
Chief Complaint  Patient presents with  . Medicare Wellness    fasting CPE. (blood in lab) No new concerns.     [name] is a 74 y.o. male who presents for annual wellness visit and follow-up on chronic medical conditions.   Hypertension follow-up:He is taking amlodipine and Losartan.  He denies any side effects. He denies headaches, dizziness, chest pain, palpitations, edema or shortness of breath. BP's are running 130-135/70-75 at home.  Yesterday at Dr. Cindra Eves office he recalls it was around 135/78.  Hyperlipidemia follow-up: Patient is reportedly following a low-fat, low cholesterol diet.His diet hasn't changed.Compliant with taking his Crestor and Tricor, and denies medication side effects.LDL was at goal on last check (Crestor dose was increased last year) Lab Results  Component Value Date   CHOL 128 11/13/2018   HDL 31 (L) 11/13/2018   LDLCALC 64 11/13/2018   TRIG 163 (H) 11/13/2018   CHOLHDL 4.1 11/13/2018    DM: managed by Dr. Buddy Duty. He saw him yesterday, but hasn't gotten any lab results yet, expects to today.  His last A1c was 8.5 in 04/2019 with Dr. Buddy Duty. Denies any change in medications being made after that visit. Sugars are running 140's fasting.  Admits that he doesn't check his sugars daily His regimen has beenOzempic (stopped in 06/2019 due to cost, over $500; plans to restart after deductible is met) and70/30 insulin(60 with breakfast, 70 with dinner).  Denies hypoglycemia. Denies polydipsia, polyuria.Gets routine care at podiatrist. He denies any neuropathy or lesions. Last eye exam was in August (report requested and received today), going frequently to Dr. [place], who referred him to Dr. Lucita Ferrara. May need surgery on his tear duct (due to dry eyes).  ED: has been using sildenafil 45m tablets; 668mseems to be effective.   OSA: Uses CPAP machine nightly. He feels better when using it. Denies any problems, tolerating it well. Feels refreshed  when he wakes up. He continues to get benefit from CPAP use.  Possible lipoma at L occipital neck--he hasn't noticed any change since his last visit.  Obesity:  He reports eating pretty healthy, no changes in diet or exercise during the pandemic.  As he is describing his typical eating pattern, he realizes that he overeats at dinner (appetizers and portions). Typically eats cereal for breakfast, some PB and crackers with soup, or sometimes salad or 1/2 sandwich for lunch.  Eats light during the day, heavier dinner.  Chicken, rice or beans with salad for dinner.  Admits to larger portion of rice than he should.  Admits to eating a lot of hors d'heuvres before dinner (nuts (2 handfuls of cashews, unsalted), cheese and crackers).  Immunization History  Administered Date(s) Administered  . Influenza Split 04/19/2011, 03/01/2012  . Influenza, High Dose Seasonal PF 03/07/2016, 03/29/2017, 02/28/2018, 02/21/2019  . Influenza-Unspecified 04/07/2014, 02/04/2015  . PFIZER SARS-COV-2 Vaccination 07/03/2019  . Pneumococcal Conjugate-13 01/22/2015  . Pneumococcal Polysaccharide-23 06/07/2004, 03/01/2012  . Td 06/07/2002  . Tdap 03/01/2012  . Zoster 04/18/2008   Last colonoscopy:06/2016 tubular adenoma, repeat in 5 years Last PSA: Lab Results  Component Value Date   PSA1 1.8 07/03/2018   PSA 1.4 05/20/2017   PSA 1.8 06/15/2016   PSA 3.5 04/12/2016   Dentist: once or twice yearly Ophtho: once yearly (more frequently due to dry eye issues) Exercise: walksdog 30-35 minutes in the morning daily, 20-25 minutes in the evening 3x/week, slower. Plays golf when weather allows (2x/week). No weight-bearing exercise. Has some weights at home, uses  them sporadically.  Other doctors caring for patient include: Dr. Buddy Duty for diabetes.  Dr. [place] for ophtho, and Dr. Lucita Ferrara  Dr. Mallie Mussel Danis(GI) Dr. Woodfin Ganja (dentist) Dr. Pearline Cables (dermatologist) Dr. Mayer--podiatrist Dr. Redmond Baseman (ENT; cleans his  ears)  Depression screen: negative Fall screen: negative Functional status survey unremarkable Mini-Cog screen: normal See full screens in Epic  End of Life Discussion: Patient hasa living will and medical power of attorney.   PMH, PSH, SH and FH reviewed  Outpatient Encounter Medications as of 07/19/2019  Medication Sig Note  . ACCU-CHEK FASTCLIX LANCETS MISC use three times a day WHEN CHECKING BLOOD SUGAR 10/10/2015: Received from: External Pharmacy  . ACCU-CHEK SMARTVIEW test strip 3 (three) times daily. for testing 10/10/2015: Received from: External Pharmacy  . amLODipine (NORVASC) 5 MG tablet TAKE 1 TABLET(5 MG) BY MOUTH DAILY   . B-D INS SYRINGE 0.5CC/31GX5/16 31G X 5/16" 0.5 ML MISC  12/24/2013: Received from: External Pharmacy  . BD INSULIN SYRINGE ULTRAFINE 31G X 15/64" 1 ML MISC  10/16/2014: Received from: External Pharmacy  . fenofibrate (TRICOR) 145 MG tablet TAKE 1 TABLET(145 MG) BY MOUTH DAILY   . Lifitegrast (XIIDRA) 5 % SOLN Apply 1 drop to eye 2 (two) times daily.   Marland Kitchen losartan (COZAAR) 100 MG tablet TAKE 1 TABLET(100 MG) BY MOUTH DAILY   . NOVOLIN 70/30 RELION (70-30) 100 UNIT/ML injection Inject 60 Units into the skin daily with breakfast. And 70 units with dinner 10/16/2014: Received from: External Pharmacy  . rosuvastatin (CRESTOR) 20 MG tablet TAKE 1 TABLET(20 MG) BY MOUTH DAILY   . sildenafil (REVATIO) 20 MG tablet Take 1 tablet (20 mg total) by mouth 3 (three) times daily.   . SYSTANE ULTRA 0.4-0.3 % SOLN Apply 1 drop to eye 3 (three) times daily.   . CEQUA 0.09 % SOLN Apply 1 drop to eye 2 (two) times daily.   Marland Kitchen OZEMPIC, 1 MG/DOSE, 2 MG/1.5ML SOPN  07/19/2019: Hasn't taken for a few weeks due to cost  . [DISCONTINUED] BEPREVE 1.5 % SOLN INT 1 GTT INTO  OU BID.   . [DISCONTINUED] doxycycline (ADOXA) 50 MG tablet    . [DISCONTINUED] LOTEMAX SM 0.38 % GEL Apply 1 drop to eye 4 (four) times daily.   . [DISCONTINUED] OZEMPIC, 0.25 OR 0.5 MG/DOSE, 2 MG/1.5ML SOPN INJ 0.5  MG [place] ONCE PER WK   . [DISCONTINUED] PAZEO 0.7 % SOLN 1 drop as needed.     Facility-Administered Encounter Medications as of 07/19/2019  Medication  . 0.9 %  sodium chloride infusion    No Known Allergies  ROS: The patient denies anorexia, fever, headaches, vision loss, decreased hearing, ear pain, hoarseness, chest pain, palpitations, dizziness, syncope, dyspnea on exertion, cough, swelling, nausea, vomiting, constipation, melena, hematochezia, indigestion/heartburn, hematuria, incontinence, nocturia, weakened urine stream, dysuria, genital lesions, joint pains (just some morning stiffness, back), numbness, tingling, weakness, tremor, suspicious skin lesions, depression, anxiety, abnormal bleeding/bruising, or enlarged lymph nodes.  +erectile dysfunction  Occasional hesitancy. Some frequency at 4-6pm, but sleeps through the night. No change to soft tissue mass on back of neck. Occasional gas/bloating (less since off metformin, but still gets intermittently) Dry eyes    PHYSICAL EXAM:  BP 140/86   Pulse 72   Temp 97.8 F (36.6 C) (Other (Comment))   Ht '5\' 8"'$  (1.727 m)   Wt 212 lb (96.2 kg)   BMI 32.23 kg/m   Wt Readings from Last 3 Encounters:  07/19/19 212 lb (96.2 kg)  11/15/18 211 lb (95.7  kg)  11/13/18 211 lb (95.7 kg)   144/68 on repeat by MD, RA   General Appearance:  Alert, cooperative, no distress, appears stated age   Head:  Normocephalic, without obvious abnormality, atraumatic   Eyes:  PERRL, conjunctiva/corneas clear, EOM's intact, fundi benign   Ears:  Normal TM's and external ear canals Some scarring noted on left.  Nose:  Not examined, wearing mask due to COVID-19 pandemic  Throat:  Not examined, wearing mask due to COVID-19 pandemic   Neck:  Supple, no lymphadenopathy; thyroid: no enlargement/ tenderness/nodules; no carotid bruit or JVD. 3x3 cm soft tissue mass left posterior neck. Nontender, unchanged  Back:  Spine nontender, no curvature,  ROM normal, no CVA tenderness   Lungs:  Clear to auscultation bilaterally without wheezes, rales or ronchi; respirations unlabored   Chest Wall:  No tenderness or deformity   Heart:  Regular rate and rhythm, S1 and S2 normal, no murmur, rub  or gallop   Breast Exam:  No chest wall tenderness, masses or gynecomastia   Abdomen:  Soft, non-tender, nondistended, normoactive bowel sounds, no masses, no hepatosplenomegaly   Genitalia:  Normal male external genitalia without lesions. Testicles without masses. No inguinal hernias.   Rectal:  Normal sphincter tone, no masses or tenderness; guaiac negative stool. Prostate smooth, no nodules, mild-mod enlarged.No nodule/mass.   Extremities:  No clubbing, cyanosis or edema   Pulses:  2+ and symmetric all extremities   Skin:  Skin color, texture, turgor normal, no rashes.White, hyperkeratotic papules and AK's on bilateral LE's. Onychomycotic toenails, and decreased monofilament sensation at R heel (some callous/thickening of skin present).  Lymph nodes:  Cervical, supraclavicular, and axillary nodes normal   Neurologic:  CNII-XII intact, normal strength, sensation and gait; reflexes 2+ and symmetric throughout    Psych: Normal mood, affect, hygiene and grooming.  Diabetic foot exam: Decreased monofilament at R heel.  Some callous present. onychomycotic toenails.   ASSESSMENT/PLAN:  Medicare annual wellness visit, subsequent  Essential hypertension, benign - Borderline today, lower at home. Counseled re: wt loss. Cont losartan and amlodipine at current doses  Mixed hyperlipidemia - cont fenofibrate and rosuvastatin. Counseled re: diet and sugars (last LDL at goal, TG elevated) - Plan: Lipid panel  Type 2 diabetes mellitus with hyperglycemia, with long-term current use of insulin (HCC) - waiting on results of A1c from labs with Dr. Buddy Duty yest--suspect too high based on sugars. Encouraged him to monitor  more frequently, diet reviewed, wt loss - Plan: Comprehensive metabolic panel, TSH  Poorly controlled diabetes mellitus ([place]) - discussed reasons to monitor sugars more regularly, to get better control. Weight loss discussed in detail. f/u with Dr. Buddy Duty re: A1c and med changes  Medication monitoring encounter - Plan: CBC with Differential/Platelet, Comprehensive metabolic panel, Lipid panel  OSA on CPAP - compliant, continue use.  Wt loss recommended  Erectile dysfunction, unspecified erectile dysfunction type - continue sildenafil prn, may increase up to 151m, if needed  Hyperlipidemia associated with type 2 diabetes mellitus ([place]  Hypertension associated with diabetes ([place]  Special screening for malignant neoplasm of prostate - counseled re: PSA (and that screening would be stopped at age 74 - Plan: PSA  Class 1 obesity due to excess calories with serious comorbidity and body mass index (BMI) of 32.0 to 32.9 in adult - counseled re: portions, healthy diet, exercise, wt loss.    c-met, cbc, TSH, lipids, PSA Looks like A1c, urine microalb and Cr done 04/2019 by Dr. KBuddy DutyHad labs repeated yesterday  at Dr. Cindra Eves (he thinks just A1c)  He stopped taking aspirin last year, after reading an article about it.  Discussed potential risks/SE, primary vs secondary prevention.  He prefers to stay off for now.  Counseled extensively regarding portion, diet and weight loss, including alcohol portions/use. Encouraged him to check sugars daily, preferably 2x/d--he will see the impact of his diet on his sugars, and more likely to make changes, which will help his blood sugars (especially if not currently taking ozempic).   Recommended at least 30 minutes of aerobic activity at least 5 days/week, weight-bearing exercise 2x/week; proper sunscreen use reviewed; healthy diet and alcohol recommendations (less than or equal to 2 drinks/day) reviewed; regular seatbelt use; changing batteries in smoke  detectors. Immunization recommendations discussed,UTD;continuehigh dose flu shots; Get 2nd COVID vaccine as planned.  Shingrix recommended and side effects reviewed, to wait 4 weeks after last COVID vaccine, needs to get from pharmacy. Colonoscopy recommendations reviewed--due again 06/2021.    Full Code, Full Care Asked to get [place] copies of his Living Will and healthcare POA.  6 mos med check, 1 year AWV   Medicare Attestation I have personally reviewed: The patient's medical and social history Their use of alcohol, tobacco or illicit drugs Their current medications and supplements The patient's functional ability including ADLs,fall risks, home safety risks, cognitive, and hearing and visual impairment Diet and physical activities Evidence for depression or mood disorders  The patient's weight, height, BMI have been recorded in the chart.  I have made referrals, counseling, and provided education to the patient based on review of the above and I have provided the patient with a written personalized care plan for preventive services.

## 2019-07-18 NOTE — Patient Instructions (Addendum)
HEALTH MAINTENANCE RECOMMENDATIONS:  It is recommended that you get at least 30 minutes of aerobic exercise at least 5 days/week (for weight loss, you may need as much as 60-90 minutes). This can be any activity that gets your heart rate up. This can be divided in 10-15 minute intervals if needed, but try and build up your endurance at least once a week.  Weight bearing exercise is also recommended twice weekly.  Eat a healthy diet with lots of vegetables, fruits and fiber.  "Colorful" foods have a lot of vitamins (ie green vegetables, tomatoes, red peppers, etc).  Limit sweet tea, regular sodas and alcoholic beverages, all of which has a lot of calories and sugar.  Up to 2 alcoholic drinks daily may be beneficial for men (unless trying to lose weight, watch sugars).  Drink a lot of water.  Sunscreen of at least SPF 30 should be used on all sun-exposed parts of the skin when outside between the hours of 10 am and 4 pm (not just when at beach or pool, but even with exercise, golf, tennis, and yard work!)  Use a sunscreen that says "broad spectrum" so it covers both UVA and UVB rays, and make sure to reapply every 1-2 hours.  Remember to change the batteries in your smoke detectors when changing your clock times in the spring and fall. Carbon monoxide detectors are recommended for your home.  Use your seat belt every time you are in a car, and please drive safely and not be distracted with cell phones and texting while driving.    Mr. Biby , Thank you for taking time to come for your Medicare Wellness Visit. I appreciate your ongoing commitment to your health goals. Please review the following plan we discussed and let me know if I can assist you in the future.    This is a list of the screening recommended for you and due dates:  Health Maintenance  Topic Date Due  . Eye exam for diabetics  11/29/2018  . Hemoglobin A1C  10/15/2019  . Complete foot exam   04/16/2020  . Colon Cancer  Screening  07/07/2021  . Tetanus Vaccine  03/01/2022  . Flu Shot  Completed  .  Hepatitis C: One time screening is recommended by Center for Disease Control  (CDC) for  adults born from [birth year] through 1964.   Completed  . Pneumonia vaccines  Completed   Continue to get eye exams yearly, and have them send [place] copies of the diabetic eye exam report. (we know you aren't past due, we just don't have the results in your chart).  Get your second COVID vaccine as planned.  Shingrix is recommended--you need to get this from the pharmacy, as it is covered by Medicare Part D.  You need to wait 4 weeks after your last COVID vaccine before getting Shingrix.  It is a series of 2 vaccines, given 2 months apart.  Please check your sugars more frequently, as this information can help you realize which foods to limit, as well as help Dr. Buddy Duty to make dosage adjustments. Checking 2 hours after a meal or at bedtime can be very helpful, in addition to fasting sugars.  I will keep my eyes out for the results from yesterday's visit.  You can increase to a total dose of 100mg  max (5 tablets) of the sildenafil, if needed, for improvement in erectile function.  At your convenience, please get [place] copies of your living will and healthcare power of  attorney to scan into your chart.  Cut back on snacking before dinner . Nuts are tough to limit portions--ideally just 1/4 cup (1 handful or less).  Try snacking on grapes or something lighter and smaller before dinner, to help limit calories and aid in weight loss. Also ensure that you are limiting to 1-2 drinks daily (with only 1 shot equalling 1 drink--so either 2 shots and no beer, or one shot of whiskey plus a beer).

## 2019-07-19 ENCOUNTER — Other Ambulatory Visit: Payer: Self-pay

## 2019-07-19 ENCOUNTER — Encounter: Payer: Self-pay | Admitting: Family Medicine

## 2019-07-19 ENCOUNTER — Ambulatory Visit (INDEPENDENT_AMBULATORY_CARE_PROVIDER_SITE_OTHER): Payer: Medicare Other | Admitting: Family Medicine

## 2019-07-19 VITALS — BP 140/86 | HR 72 | Temp 97.8°F | Ht 68.0 in | Wt 212.0 lb

## 2019-07-19 DIAGNOSIS — E1169 Type 2 diabetes mellitus with other specified complication: Secondary | ICD-10-CM

## 2019-07-19 DIAGNOSIS — E6609 Other obesity due to excess calories: Secondary | ICD-10-CM

## 2019-07-19 DIAGNOSIS — Z9989 Dependence on other enabling machines and devices: Secondary | ICD-10-CM | POA: Diagnosis not present

## 2019-07-19 DIAGNOSIS — Z125 Encounter for screening for malignant neoplasm of prostate: Secondary | ICD-10-CM | POA: Diagnosis not present

## 2019-07-19 DIAGNOSIS — G4733 Obstructive sleep apnea (adult) (pediatric): Secondary | ICD-10-CM | POA: Diagnosis not present

## 2019-07-19 DIAGNOSIS — E7800 Pure hypercholesterolemia, unspecified: Secondary | ICD-10-CM | POA: Diagnosis not present

## 2019-07-19 DIAGNOSIS — N529 Male erectile dysfunction, unspecified: Secondary | ICD-10-CM

## 2019-07-19 DIAGNOSIS — Z0000 Encounter for general adult medical examination without abnormal findings: Secondary | ICD-10-CM

## 2019-07-19 DIAGNOSIS — Z5181 Encounter for therapeutic drug level monitoring: Secondary | ICD-10-CM | POA: Diagnosis not present

## 2019-07-19 DIAGNOSIS — Z794 Long term (current) use of insulin: Secondary | ICD-10-CM | POA: Diagnosis not present

## 2019-07-19 DIAGNOSIS — E1159 Type 2 diabetes mellitus with other circulatory complications: Secondary | ICD-10-CM | POA: Diagnosis not present

## 2019-07-19 DIAGNOSIS — E782 Mixed hyperlipidemia: Secondary | ICD-10-CM

## 2019-07-19 DIAGNOSIS — I152 Hypertension secondary to endocrine disorders: Secondary | ICD-10-CM

## 2019-07-19 DIAGNOSIS — Z6832 Body mass index (BMI) 32.0-32.9, adult: Secondary | ICD-10-CM

## 2019-07-19 DIAGNOSIS — E66811 Obesity, class 1: Secondary | ICD-10-CM

## 2019-07-19 DIAGNOSIS — I10 Essential (primary) hypertension: Secondary | ICD-10-CM | POA: Diagnosis not present

## 2019-07-19 DIAGNOSIS — E1165 Type 2 diabetes mellitus with hyperglycemia: Secondary | ICD-10-CM

## 2019-07-19 DIAGNOSIS — E785 Hyperlipidemia, unspecified: Secondary | ICD-10-CM

## 2019-07-19 MED ORDER — LOSARTAN POTASSIUM 100 MG PO TABS: ORAL_TABLET | ORAL | 1 refills | Status: DC

## 2019-07-20 ENCOUNTER — Encounter: Payer: Self-pay | Admitting: Family Medicine

## 2019-07-20 LAB — CBC WITH DIFFERENTIAL/PLATELET
Basophils Absolute: 0 10*3/uL (ref 0.0–0.2)
Basos: 1 %
EOS (ABSOLUTE): 0.2 10*3/uL (ref 0.0–0.4)
Eos: 4 %
Hematocrit: 41.8 % (ref 37.5–51.0)
Hemoglobin: 14.3 g/dL (ref 13.0–17.7)
Immature Grans (Abs): 0 10*3/uL (ref 0.0–0.1)
Immature Granulocytes: 1 %
Lymphocytes Absolute: 1.5 10*3/uL (ref 0.7–3.1)
Lymphs: 32 %
MCH: 32.3 pg (ref 26.6–33.0)
MCHC: 34.2 g/dL (ref 31.5–35.7)
MCV: 94 fL (ref 79–97)
Monocytes Absolute: 0.4 10*3/uL (ref 0.1–0.9)
Monocytes: 9 %
Neutrophils Absolute: 2.5 10*3/uL (ref 1.4–7.0)
Neutrophils: 53 %
Platelets: 198 10*3/uL (ref 150–450)
RBC: 4.43 x10E6/uL (ref 4.14–5.80)
RDW: 12.7 % (ref 11.6–15.4)
WBC: 4.6 10*3/uL (ref 3.4–10.8)

## 2019-07-20 LAB — LIPID PANEL
Chol/HDL Ratio: 4.2 ratio (ref 0.0–5.0)
Cholesterol, Total: 126 mg/dL (ref 100–199)
HDL: 30 mg/dL — ABNORMAL LOW (ref >39)
LDL Chol Calc (NIH): 63 mg/dL (ref 0–99)
Triglycerides: 197 mg/dL — ABNORMAL HIGH (ref 0–149)
VLDL Cholesterol Cal: 33 mg/dL (ref 5–40)

## 2019-07-20 LAB — COMPREHENSIVE METABOLIC PANEL
ALT: 30 IU/L (ref 0–44)
AST: 25 IU/L (ref 0–40)
Albumin/Globulin Ratio: 2.3 — ABNORMAL HIGH (ref 1.2–2.2)
Albumin: 4.6 g/dL (ref 3.7–4.7)
Alkaline Phosphatase: 46 IU/L (ref 39–117)
BUN/Creatinine Ratio: 13 (ref 10–24)
BUN: 16 mg/dL (ref 8–27)
Bilirubin Total: 0.9 mg/dL (ref 0.0–1.2)
CO2: 23 mmol/L (ref 20–29)
Calcium: 10.2 mg/dL (ref 8.6–10.2)
Chloride: 102 mmol/L (ref 96–106)
Creatinine, Ser: 1.23 mg/dL (ref 0.76–1.27)
GFR calc Af Amer: 67 mL/min/{1.73_m2} (ref >59)
GFR calc non Af Amer: 58 mL/min/{1.73_m2} — ABNORMAL LOW (ref >59)
Globulin, Total: 2 g/dL (ref 1.5–4.5)
Glucose: 219 mg/dL — ABNORMAL HIGH (ref 65–99)
Potassium: 4.9 mmol/L (ref 3.5–5.2)
Sodium: 142 mmol/L (ref 134–144)
Total Protein: 6.6 g/dL (ref 6.0–8.5)

## 2019-07-20 LAB — TSH: TSH: 1.12 u[IU]/mL (ref 0.450–4.500)

## 2019-07-20 LAB — PSA: Prostate Specific Ag, Serum: 2.1 ng/mL (ref 0.0–4.0)

## 2019-07-20 MED ORDER — ROSUVASTATIN CALCIUM 20 MG PO TABS: ORAL_TABLET | ORAL | 3 refills | Status: DC

## 2019-08-02 DIAGNOSIS — H02403 Unspecified ptosis of bilateral eyelids: Secondary | ICD-10-CM | POA: Diagnosis not present

## 2019-08-02 DIAGNOSIS — H0289 Other specified disorders of eyelid: Secondary | ICD-10-CM | POA: Diagnosis not present

## 2019-08-29 DIAGNOSIS — I10 Essential (primary) hypertension: Secondary | ICD-10-CM | POA: Insufficient documentation

## 2019-08-30 DIAGNOSIS — H02112 Cicatricial ectropion of right lower eyelid: Secondary | ICD-10-CM | POA: Diagnosis not present

## 2019-08-30 DIAGNOSIS — H05243 Constant exophthalmos, bilateral: Secondary | ICD-10-CM | POA: Diagnosis not present

## 2019-08-30 DIAGNOSIS — H16212 Exposure keratoconjunctivitis, left eye: Secondary | ICD-10-CM | POA: Diagnosis not present

## 2019-08-30 DIAGNOSIS — H16211 Exposure keratoconjunctivitis, right eye: Secondary | ICD-10-CM | POA: Diagnosis not present

## 2019-08-30 DIAGNOSIS — H02132 Senile ectropion of right lower eyelid: Secondary | ICD-10-CM | POA: Diagnosis not present

## 2019-08-30 DIAGNOSIS — H02135 Senile ectropion of left lower eyelid: Secondary | ICD-10-CM | POA: Diagnosis not present

## 2019-08-30 DIAGNOSIS — H04123 Dry eye syndrome of bilateral lacrimal glands: Secondary | ICD-10-CM | POA: Diagnosis not present

## 2019-08-30 DIAGNOSIS — H02535 Eyelid retraction left lower eyelid: Secondary | ICD-10-CM | POA: Diagnosis not present

## 2019-08-30 DIAGNOSIS — H02532 Eyelid retraction right lower eyelid: Secondary | ICD-10-CM | POA: Diagnosis not present

## 2019-08-30 DIAGNOSIS — H02115 Cicatricial ectropion of left lower eyelid: Secondary | ICD-10-CM | POA: Diagnosis not present

## 2019-08-30 DIAGNOSIS — H16213 Exposure keratoconjunctivitis, bilateral: Secondary | ICD-10-CM | POA: Diagnosis not present

## 2019-08-31 DIAGNOSIS — H05243 Constant exophthalmos, bilateral: Secondary | ICD-10-CM | POA: Diagnosis not present

## 2019-09-03 ENCOUNTER — Other Ambulatory Visit: Payer: Self-pay | Admitting: Ophthalmology

## 2019-09-03 DIAGNOSIS — H05243 Constant exophthalmos, bilateral: Secondary | ICD-10-CM

## 2019-09-17 ENCOUNTER — Other Ambulatory Visit: Payer: Self-pay | Admitting: Family Medicine

## 2019-09-17 DIAGNOSIS — E782 Mixed hyperlipidemia: Secondary | ICD-10-CM

## 2019-09-17 DIAGNOSIS — I10 Essential (primary) hypertension: Secondary | ICD-10-CM

## 2019-09-20 ENCOUNTER — Ambulatory Visit
Admission: RE | Admit: 2019-09-20 | Discharge: 2019-09-20 | Disposition: A | Discharge status: Home / Self Care | Payer: Medicare Other | Source: Ambulatory Visit | Attending: Ophthalmology | Admitting: Ophthalmology

## 2019-09-20 ENCOUNTER — Other Ambulatory Visit: Payer: Self-pay

## 2019-09-20 DIAGNOSIS — H05243 Constant exophthalmos, bilateral: Secondary | ICD-10-CM

## 2019-09-28 ENCOUNTER — Ambulatory Visit (INDEPENDENT_AMBULATORY_CARE_PROVIDER_SITE_OTHER): Payer: Medicare Other | Admitting: Podiatry

## 2019-09-28 ENCOUNTER — Encounter: Payer: Self-pay | Admitting: Podiatry

## 2019-09-28 ENCOUNTER — Other Ambulatory Visit: Payer: Self-pay

## 2019-09-28 VITALS — Temp 96.3°F

## 2019-09-28 DIAGNOSIS — M79674 Pain in right toe(s): Secondary | ICD-10-CM | POA: Diagnosis not present

## 2019-09-28 DIAGNOSIS — M79675 Pain in left toe(s): Secondary | ICD-10-CM

## 2019-09-28 DIAGNOSIS — B351 Tinea unguium: Secondary | ICD-10-CM | POA: Diagnosis not present

## 2019-09-28 DIAGNOSIS — E1165 Type 2 diabetes mellitus with hyperglycemia: Secondary | ICD-10-CM

## 2019-09-28 DIAGNOSIS — Z794 Long term (current) use of insulin: Secondary | ICD-10-CM

## 2019-09-28 NOTE — Progress Notes (Signed)
This patient returns to my office for at risk foot care.  This patient requires this care by a professional since this patient will be at risk due to having type 2 diabetes.  This patient is unable to cut nails himself since the patient cannot reach his nails.These nails are painful walking and wearing shoes.  This patient presents for at risk foot care today.  General Appearance  Alert, conversant and in no acute stress.  Vascular  Dorsalis pedis and posterior tibial  pulses are palpable  bilaterally.  Capillary return is within normal limits  bilaterally. Temperature is within normal limits  bilaterally.  Neurologic  Senn-Weinstein monofilament wire test within normal limits  bilaterally. Muscle power within normal limits bilaterally.  Nails Thick disfigured discolored nails with subungual debris  from hallux to fifth toes bilaterally. No evidence of bacterial infection or drainage bilaterally.  Orthopedic  No limitations of motion  feet .  No crepitus or effusions noted.  No bony pathology or digital deformities noted.  Skin  normotropic skin with no porokeratosis noted bilaterally.  No signs of infections or ulcers noted.     Onychomycosis  Pain in right toes  Pain in left toes  Consent was obtained for treatment procedures.   Mechanical debridement of nails 1-5  bilaterally performed with a nail nipper.  Filed with dremel without incident.    Return office visit    9 weeks                  Told patient to return for periodic foot care and evaluation due to potential at risk complications.   Jenasis Straley DPM  

## 2019-10-19 DIAGNOSIS — Z794 Long term (current) use of insulin: Secondary | ICD-10-CM | POA: Diagnosis not present

## 2019-10-19 DIAGNOSIS — E669 Obesity, unspecified: Secondary | ICD-10-CM | POA: Diagnosis not present

## 2019-10-19 DIAGNOSIS — E1165 Type 2 diabetes mellitus with hyperglycemia: Secondary | ICD-10-CM | POA: Diagnosis not present

## 2019-10-19 LAB — HEMOGLOBIN A1C: Hemoglobin A1C: 9

## 2019-10-31 ENCOUNTER — Encounter: Payer: Self-pay | Admitting: Family Medicine

## 2019-10-31 DIAGNOSIS — H05243 Constant exophthalmos, bilateral: Secondary | ICD-10-CM | POA: Diagnosis not present

## 2019-10-31 DIAGNOSIS — H02532 Eyelid retraction right lower eyelid: Secondary | ICD-10-CM | POA: Diagnosis not present

## 2019-10-31 DIAGNOSIS — H02535 Eyelid retraction left lower eyelid: Secondary | ICD-10-CM | POA: Diagnosis not present

## 2019-10-31 DIAGNOSIS — H16213 Exposure keratoconjunctivitis, bilateral: Secondary | ICD-10-CM | POA: Diagnosis not present

## 2019-12-07 ENCOUNTER — Other Ambulatory Visit: Payer: Self-pay

## 2019-12-07 ENCOUNTER — Ambulatory Visit (INDEPENDENT_AMBULATORY_CARE_PROVIDER_SITE_OTHER): Payer: Medicare Other | Admitting: Podiatry

## 2019-12-07 ENCOUNTER — Encounter: Payer: Self-pay | Admitting: Podiatry

## 2019-12-07 DIAGNOSIS — M79675 Pain in left toe(s): Secondary | ICD-10-CM | POA: Diagnosis not present

## 2019-12-07 DIAGNOSIS — Z794 Long term (current) use of insulin: Secondary | ICD-10-CM

## 2019-12-07 DIAGNOSIS — E1165 Type 2 diabetes mellitus with hyperglycemia: Secondary | ICD-10-CM | POA: Diagnosis not present

## 2019-12-07 DIAGNOSIS — M79674 Pain in right toe(s): Secondary | ICD-10-CM

## 2019-12-07 DIAGNOSIS — B351 Tinea unguium: Secondary | ICD-10-CM

## 2019-12-07 NOTE — Progress Notes (Signed)
This patient returns to my office for at risk foot care.  This patient requires this care by a professional since this patient will be at risk due to having type 2 diabetes.  This patient is unable to cut nails himself since the patient cannot reach his nails.These nails are painful walking and wearing shoes.  This patient presents for at risk foot care today.  General Appearance  Alert, conversant and in no acute stress.  Vascular  Dorsalis pedis and posterior tibial  pulses are palpable  bilaterally.  Capillary return is within normal limits  bilaterally. Temperature is within normal limits  bilaterally.  Neurologic  Senn-Weinstein monofilament wire test within normal limits  bilaterally. Muscle power within normal limits bilaterally.  Nails Thick disfigured discolored nails with subungual debris  from hallux to fifth toes bilaterally. No evidence of bacterial infection or drainage bilaterally.  Orthopedic  No limitations of motion  feet .  No crepitus or effusions noted.  No bony pathology or digital deformities noted.  Skin  normotropic skin with no porokeratosis noted bilaterally.  No signs of infections or ulcers noted.     Onychomycosis  Pain in right toes  Pain in left toes  Consent was obtained for treatment procedures.   Mechanical debridement of nails 1-5  bilaterally performed with a nail nipper.  Filed with dremel without incident.    Return office visit    9 weeks                  Told patient to return for periodic foot care and evaluation due to potential at risk complications.   Mckinzee Spirito DPM  

## 2019-12-19 ENCOUNTER — Other Ambulatory Visit: Payer: Self-pay | Admitting: Family Medicine

## 2019-12-19 DIAGNOSIS — E782 Mixed hyperlipidemia: Secondary | ICD-10-CM

## 2019-12-19 DIAGNOSIS — H02126 Mechanical ectropion of left eye, unspecified eyelid: Secondary | ICD-10-CM | POA: Diagnosis not present

## 2019-12-19 DIAGNOSIS — I10 Essential (primary) hypertension: Secondary | ICD-10-CM

## 2019-12-19 DIAGNOSIS — H02123 Mechanical ectropion of right eye, unspecified eyelid: Secondary | ICD-10-CM | POA: Diagnosis not present

## 2019-12-19 DIAGNOSIS — H11823 Conjunctivochalasis, bilateral: Secondary | ICD-10-CM | POA: Diagnosis not present

## 2019-12-19 DIAGNOSIS — H04123 Dry eye syndrome of bilateral lacrimal glands: Secondary | ICD-10-CM | POA: Diagnosis not present

## 2019-12-25 DIAGNOSIS — H2513 Age-related nuclear cataract, bilateral: Secondary | ICD-10-CM | POA: Diagnosis not present

## 2019-12-25 LAB — HM DIABETES EYE EXAM

## 2020-01-20 NOTE — Progress Notes (Signed)
Chief Complaint  Patient presents with  . Hypertension    med check(patient is not fasting). No new concerns. Has eye exam with Dr. Macarthur Critchley 3 weeks ago.     Patient presents for 6 month med check.  Hypertension follow-up:He is taking amlodipine and Losartan.  He denies any side effects. He denies headaches, dizziness, chest pain, palpitations, edema or shortness of breath. BP's are running 120-130/70-80 at home.  Was 140/86 at last visit here. Has had eye surgery, getting BP checked frequently and they have been normal per patient.  Hyperlipidemia follow-up: Patient is reportedly following a low-fat, low cholesterol diet.His diet hasn't changed.Compliant with taking his Crestor and Tricor, and denies medication side effects.TG was elevated on last check, but he isn't fasting today.  LDL was at goal. Lab Results  Component Value Date   CHOL 126 07/19/2019   HDL 30 (L) 07/19/2019   LDLCALC 63 07/19/2019   TRIG 197 (H) 07/19/2019   CHOLHDL 4.2 07/19/2019   DM: managed by Dr. Buddy Duty. He saw him last in 10/2019, and A1c was high, 9%.  He is currently taking 70/30 insulin at a dose of 70 U twice daily.  He never restarted Ozempic (took Ozempic at one point, stopped due to cost over $500, was going to restart once deductible met)  He has f/u on Friday with Dr. Buddy Duty.  He is seeing high values in the mornings, with some hypoglycemia later in the mornings (seeing 240 when he wakes up, down to 50 sometimes late in the mornings; once recalled getting confused while out for a walk).  He now is being better about carrying glucose tablets and checking sugars before going out (ie walking, golfing).  Denies polydipsia, polyuria. +hypoglycemia in the mornings--once every 2 weeks.  Gets routine care at podiatrist.He denies any neuropathy or lesions. Last eye exam was 3 weeks ago.  He had 2 surgeries--he states he had cauterization for his dry eyes.  Got referred to other specialists for cosmetic  surgery--tightened the eyes so that he could retain the fluid in the eyes better.  ED:has been using sildenafil 69m tablets; 666mseems to be effective most of the time, not always.  Gets a headache sometimes after taking it.  OSA: Uses CPAP machine nightly. He feels better when using it. Denies any problems, tolerating it well. Feels refreshed when he wakes up most of the time, some days he doesn't.He continues to get benefit from CPAP use. He thinks he wants to try a new mask (that just came out), which might be more comfortable.  He plans to check with DME supplier.  Possible lipoma at L occipital neck--he hasn't noticed any change since his last visit.  Obesity:  He reports eating pretty healthy, no changes in diet. He has exercised a lot less recently due to multiple eye surgeries.  He is now back to walking.  Hd Reports eating well for breakfast and lunch, but having hors d'heuvres (2 handfuls of unsalted cashews, or cheese and crackers--eating cheese and crackers less often), followed by a heavier dinner.  Since last visit he cut back on rice intake. Alcohol--he continues to have 2 drinks/night.  PMH, PSH, SH reviewed  Outpatient Encounter Medications as of 01/21/2020  Medication Sig Note  . ACCU-CHEK FASTCLIX LANCETS MISC use three times a day WHEN CHECKING BLOOD SUGAR 10/10/2015: Received from: External Pharmacy  . ACCU-CHEK SMARTVIEW test strip 3 (three) times daily. for testing 10/10/2015: Received from: External Pharmacy  . amLODipine (NORVASC) 5  MG tablet TAKE 1 TABLET(5 MG) BY MOUTH DAILY   . B-D INS SYRINGE 0.5CC/31GX5/16 31G X 5/16" 0.5 ML MISC  12/24/2013: Received from: External Pharmacy  . BD INSULIN SYRINGE ULTRAFINE 31G X 15/64" 1 ML MISC  10/16/2014: Received from: External Pharmacy  . Continuous Blood Gluc Sensor (FREESTYLE LIBRE 2 SENSOR) MISC    . fenofibrate (TRICOR) 145 MG tablet TAKE 1 TABLET(145 MG) BY MOUTH DAILY   . insulin NPH-regular Human (NOVOLIN 70/30) (70-30)  100 UNIT/ML injection Novolin 70/30 U-100 Insulin 100 unit/mL subcutaneous suspension  INJECT 70 UNITS 30 MINUTES BEFORE BREAKFAST AND 70 UNITS BEFORE EVENING MEAL TWICE A DAY SUBCUTANEOUSLY   . Lifitegrast (XIIDRA) 5 % SOLN Apply 1 drop to eye 2 (two) times daily.   Marland Kitchen losartan (COZAAR) 100 MG tablet TAKE 1 TABLET(100 MG) BY MOUTH DAILY   . rosuvastatin (CRESTOR) 20 MG tablet TAKE 1 TABLET(20 MG) BY MOUTH DAILY   . sildenafil (REVATIO) 20 MG tablet Take 1 tablet (20 mg total) by mouth 3 (three) times daily. 01/21/2020: Takes 3 tablets prn  . SYSTANE ULTRA 0.4-0.3 % SOLN Apply 1 drop to eye 3 (three) times daily.   Marland Kitchen UNABLE TO FIND Droplet Insulin Syringe 1 mL 31 gauge x 15/64"   . [DISCONTINUED] losartan (COZAAR) 100 MG tablet TAKE 1 TABLET(100 MG) BY MOUTH DAILY   . [DISCONTINUED] Semaglutide,0.25 or 0.5MG/DOS, (OZEMPIC, 0.25 OR 0.5 MG/DOSE,) 2 MG/1.5ML SOPN Ozempic 0.25 mg or 0.5 mg (2 mg/1.5 mL) subcutaneous pen injector  INJ 0.5 MG [place] ONCE PER WK   . EYSUVIS 0.25 % SUSP  (Patient not taking: Reported on 01/21/2020)   . [DISCONTINUED] CEQUA 0.09 % SOLN Apply 1 drop to eye 2 (two) times daily.   . [DISCONTINUED] erythromycin ophthalmic ointment    . [DISCONTINUED] neomycin-polymyxin b-dexamethasone (MAXITROL) 3.5-10000-0.1 OINT     Facility-Administered Encounter Medications as of 01/21/2020  Medication  . 0.9 %  sodium chloride infusion   No Known Allergies  ROS: no fever, chills, headaches, dizziness, URI symptoms, shortness of breath, chest pain. Denies edema. Denies bleeding, bruising, rash, GI or GU complaints other than ED, which is controlled with meds). Moods are good. See HPI   PHYSICAL EXAM:  BP (!) 152/80   Pulse 68   Ht 5' 8"  (1.727 m)   Wt 210 lb 9.6 oz (95.5 kg)   BMI 32.02 kg/m   Wt Readings from Last 3 Encounters:  01/21/20 210 lb 9.6 oz (95.5 kg)  07/19/19 212 lb (96.2 kg)  11/15/18 211 lb (95.7 kg)   Well developed, pleasant, overweight male in no distress, in  good spirits HEENT: PERRL, EOMI, conjunctiva and sclera are clear. Wearing mask Neck: no lymphadenopathy, thyromegaly or carotid bruit. STS L neck unchanged, nontender Heart: regular rate and rhythm Lungs: clear bilaterally Back: no CVA tenderness, SI or spinal tenderness Abdomen: soft, nontender, no organomegaly or mass Extremities: no edema, normal pulses.  Psych: normal mood, affect, hygiene and grooming Neuro: alert and oriented,Normal gait Skin: no visible rashes, normal turgor   ASSESSMENT/PLAN:  Uncontrolled type 2 diabetes mellitus with hypoglycemia without coma ([place]) - to f/u with Dr. Buddy Duty.  Discussed proper diet (cut back on alcohol, smaller portions at night, exercise, wt loss)--may need to stop combo insulin therapy  Poorly controlled diabetes mellitus ([facility])  Mixed hyperlipidemia - TG elevated on last check. Not fasting today, and with high sugars, TG likely still high.  Can recheck in 6 mos, to work on diet  Essential hypertension,  benign - elevated in office (rushing to get home to workers at house); normal at come. Cont current regimen - Plan: losartan (COZAAR) 100 MG tablet  Hypertension associated with diabetes ([place])  Hyperlipidemia associated with type 2 diabetes mellitus (HCC)  OSA on CPAP - continue CPAP use.  He can check with DME provider if wanting to try a different mask   Type 2 diabetes mellitus with hyperglycemia, with long-term current use of insulin ([place]) - per Dr. Buddy Duty. Rec checking other times of day (morning/after dinner or HS), record and bring to endo f/u. Cut back on alcohol; wt loss rec - Plan: losartan (COZAAR) 100 MG tablet  Essential hypertension, benign - elevated today; increase losartan to 116m (if needed, change back to Losartan HCT if BP's not at goal) - Plan: losartan (COZAAR) 100 MG tablet  Class 1 obesity due to excess calories with serious comorbidity and body mass index (BMI) of 32.0 to 32.9 in adult - comorbidity--DM, OSA, HLD, HTN.  Counseled re: diet, exercise, wt loss, risks  Labs prior to CPE--c-met, lipids, PSA, TSH, urine microalb, CBC

## 2020-01-21 ENCOUNTER — Encounter: Payer: Self-pay | Admitting: Family Medicine

## 2020-01-21 ENCOUNTER — Ambulatory Visit (INDEPENDENT_AMBULATORY_CARE_PROVIDER_SITE_OTHER): Payer: Medicare Other | Admitting: Family Medicine

## 2020-01-21 VITALS — BP 152/80 | HR 68 | Ht 68.0 in | Wt 210.6 lb

## 2020-01-21 DIAGNOSIS — I10 Essential (primary) hypertension: Secondary | ICD-10-CM | POA: Diagnosis not present

## 2020-01-21 DIAGNOSIS — E785 Hyperlipidemia, unspecified: Secondary | ICD-10-CM

## 2020-01-21 DIAGNOSIS — E1159 Type 2 diabetes mellitus with other circulatory complications: Secondary | ICD-10-CM | POA: Diagnosis not present

## 2020-01-21 DIAGNOSIS — Z9989 Dependence on other enabling machines and devices: Secondary | ICD-10-CM | POA: Diagnosis not present

## 2020-01-21 DIAGNOSIS — G4733 Obstructive sleep apnea (adult) (pediatric): Secondary | ICD-10-CM | POA: Diagnosis not present

## 2020-01-21 DIAGNOSIS — Z5181 Encounter for therapeutic drug level monitoring: Secondary | ICD-10-CM

## 2020-01-21 DIAGNOSIS — E1165 Type 2 diabetes mellitus with hyperglycemia: Secondary | ICD-10-CM | POA: Diagnosis not present

## 2020-01-21 DIAGNOSIS — E1169 Type 2 diabetes mellitus with other specified complication: Secondary | ICD-10-CM | POA: Diagnosis not present

## 2020-01-21 DIAGNOSIS — Z794 Long term (current) use of insulin: Secondary | ICD-10-CM

## 2020-01-21 DIAGNOSIS — E782 Mixed hyperlipidemia: Secondary | ICD-10-CM | POA: Diagnosis not present

## 2020-01-21 DIAGNOSIS — E11649 Type 2 diabetes mellitus with hypoglycemia without coma: Secondary | ICD-10-CM | POA: Diagnosis not present

## 2020-01-21 DIAGNOSIS — Z125 Encounter for screening for malignant neoplasm of prostate: Secondary | ICD-10-CM

## 2020-01-21 DIAGNOSIS — E6609 Other obesity due to excess calories: Secondary | ICD-10-CM

## 2020-01-21 DIAGNOSIS — Z6832 Body mass index (BMI) 32.0-32.9, adult: Secondary | ICD-10-CM

## 2020-01-21 MED ORDER — LOSARTAN POTASSIUM 100 MG PO TABS: ORAL_TABLET | ORAL | 1 refills | Status: DC

## 2020-01-21 NOTE — Patient Instructions (Signed)
Follow up with Dr. Buddy Duty as planned.  Sounds like they may need to stop the 70/30 combination, and use separate basal and regular insulin in order to get better control without the hypoglycemia.

## 2020-01-25 DIAGNOSIS — E1165 Type 2 diabetes mellitus with hyperglycemia: Secondary | ICD-10-CM | POA: Diagnosis not present

## 2020-01-25 DIAGNOSIS — H6123 Impacted cerumen, bilateral: Secondary | ICD-10-CM | POA: Diagnosis not present

## 2020-01-25 DIAGNOSIS — E669 Obesity, unspecified: Secondary | ICD-10-CM | POA: Diagnosis not present

## 2020-01-25 DIAGNOSIS — Z794 Long term (current) use of insulin: Secondary | ICD-10-CM | POA: Diagnosis not present

## 2020-02-01 ENCOUNTER — Encounter: Payer: Self-pay | Admitting: Family Medicine

## 2020-02-13 ENCOUNTER — Other Ambulatory Visit: Payer: Self-pay

## 2020-02-13 ENCOUNTER — Ambulatory Visit (INDEPENDENT_AMBULATORY_CARE_PROVIDER_SITE_OTHER): Payer: Medicare Other | Admitting: Podiatry

## 2020-02-13 ENCOUNTER — Encounter: Payer: Self-pay | Admitting: Podiatry

## 2020-02-13 DIAGNOSIS — E1165 Type 2 diabetes mellitus with hyperglycemia: Secondary | ICD-10-CM

## 2020-02-13 DIAGNOSIS — Z794 Long term (current) use of insulin: Secondary | ICD-10-CM

## 2020-02-13 DIAGNOSIS — B351 Tinea unguium: Secondary | ICD-10-CM | POA: Diagnosis not present

## 2020-02-13 DIAGNOSIS — M79675 Pain in left toe(s): Secondary | ICD-10-CM

## 2020-02-13 DIAGNOSIS — M79674 Pain in right toe(s): Secondary | ICD-10-CM | POA: Diagnosis not present

## 2020-02-13 NOTE — Progress Notes (Signed)
This patient returns to my office for at risk foot care.  This patient requires this care by a professional since this patient will be at risk due to having type 2 diabetes.  This patient is unable to cut nails himself since the patient cannot reach his nails.These nails are painful walking and wearing shoes.  This patient presents for at risk foot care today.  General Appearance  Alert, conversant and in no acute stress.  Vascular  Dorsalis pedis and posterior tibial  pulses are palpable  bilaterally.  Capillary return is within normal limits  bilaterally. Temperature is within normal limits  bilaterally.  Neurologic  Senn-Weinstein monofilament wire test within normal limits  bilaterally. Muscle power within normal limits bilaterally.  Nails Thick disfigured discolored nails with subungual debris  from hallux to fifth toes bilaterally. No evidence of bacterial infection or drainage bilaterally.  Orthopedic  No limitations of motion  feet .  No crepitus or effusions noted.  No bony pathology or digital deformities noted.  Skin  normotropic skin with no porokeratosis noted bilaterally.  No signs of infections or ulcers noted.     Onychomycosis  Pain in right toes  Pain in left toes  Consent was obtained for treatment procedures.   Mechanical debridement of nails 1-5  bilaterally performed with a nail nipper.  Filed with dremel without incident.    Return office visit    9 weeks                  Told patient to return for periodic foot care and evaluation due to potential at risk complications.   Anayelli Lai DPM  

## 2020-02-27 DIAGNOSIS — H02535 Eyelid retraction left lower eyelid: Secondary | ICD-10-CM | POA: Diagnosis not present

## 2020-02-27 DIAGNOSIS — H04123 Dry eye syndrome of bilateral lacrimal glands: Secondary | ICD-10-CM | POA: Diagnosis not present

## 2020-02-27 DIAGNOSIS — H0520 Unspecified exophthalmos: Secondary | ICD-10-CM | POA: Diagnosis not present

## 2020-02-27 DIAGNOSIS — H02532 Eyelid retraction right lower eyelid: Secondary | ICD-10-CM | POA: Diagnosis not present

## 2020-02-27 DIAGNOSIS — Z09 Encounter for follow-up examination after completed treatment for conditions other than malignant neoplasm: Secondary | ICD-10-CM | POA: Diagnosis not present

## 2020-03-22 ENCOUNTER — Other Ambulatory Visit: Payer: Self-pay | Admitting: Family Medicine

## 2020-03-22 DIAGNOSIS — E782 Mixed hyperlipidemia: Secondary | ICD-10-CM

## 2020-03-22 DIAGNOSIS — I10 Essential (primary) hypertension: Secondary | ICD-10-CM

## 2020-03-29 DIAGNOSIS — Z23 Encounter for immunization: Secondary | ICD-10-CM | POA: Diagnosis not present

## 2020-04-28 DIAGNOSIS — E1165 Type 2 diabetes mellitus with hyperglycemia: Secondary | ICD-10-CM | POA: Diagnosis not present

## 2020-04-28 DIAGNOSIS — R3915 Urgency of urination: Secondary | ICD-10-CM | POA: Diagnosis not present

## 2020-04-28 DIAGNOSIS — Z794 Long term (current) use of insulin: Secondary | ICD-10-CM | POA: Diagnosis not present

## 2020-04-28 DIAGNOSIS — N1831 Chronic kidney disease, stage 3a: Secondary | ICD-10-CM | POA: Diagnosis not present

## 2020-04-28 DIAGNOSIS — E669 Obesity, unspecified: Secondary | ICD-10-CM | POA: Diagnosis not present

## 2020-05-14 ENCOUNTER — Ambulatory Visit (INDEPENDENT_AMBULATORY_CARE_PROVIDER_SITE_OTHER): Payer: Medicare Other | Admitting: Podiatry

## 2020-05-14 ENCOUNTER — Other Ambulatory Visit: Payer: Self-pay

## 2020-05-14 ENCOUNTER — Encounter: Payer: Self-pay | Admitting: Podiatry

## 2020-05-14 DIAGNOSIS — B351 Tinea unguium: Secondary | ICD-10-CM | POA: Diagnosis not present

## 2020-05-14 DIAGNOSIS — E1165 Type 2 diabetes mellitus with hyperglycemia: Secondary | ICD-10-CM

## 2020-05-14 DIAGNOSIS — M79675 Pain in left toe(s): Secondary | ICD-10-CM

## 2020-05-14 DIAGNOSIS — Z794 Long term (current) use of insulin: Secondary | ICD-10-CM | POA: Diagnosis not present

## 2020-05-14 DIAGNOSIS — M79674 Pain in right toe(s): Secondary | ICD-10-CM | POA: Diagnosis not present

## 2020-05-14 NOTE — Progress Notes (Signed)
This patient returns to my office for at risk foot care.  This patient requires this care by a professional since this patient will be at risk due to having type 2 diabetes.  This patient is unable to cut nails himself since the patient cannot reach his nails.These nails are painful walking and wearing shoes.  This patient presents for at risk foot care today.  General Appearance  Alert, conversant and in no acute stress.  Vascular  Dorsalis pedis and posterior tibial  pulses are palpable  bilaterally.  Capillary return is within normal limits  bilaterally. Temperature is within normal limits  bilaterally.  Neurologic  Senn-Weinstein monofilament wire test within normal limits  bilaterally. Muscle power within normal limits bilaterally.  Nails Thick disfigured discolored nails with subungual debris  from hallux to fifth toes bilaterally. No evidence of bacterial infection or drainage bilaterally.  Orthopedic  No limitations of motion  feet .  No crepitus or effusions noted.  No bony pathology or digital deformities noted.  Skin  normotropic skin with no porokeratosis noted bilaterally.  No signs of infections or ulcers noted.     Onychomycosis  Pain in right toes  Pain in left toes  Consent was obtained for treatment procedures.   Mechanical debridement of nails 1-5  bilaterally performed with a nail nipper.  Filed with dremel without incident.    Return office visit    9 weeks                  Told patient to return for periodic foot care and evaluation due to potential at risk complications.   Lashae Wollenberg DPM  

## 2020-06-17 DIAGNOSIS — E119 Type 2 diabetes mellitus without complications: Secondary | ICD-10-CM | POA: Diagnosis not present

## 2020-06-19 ENCOUNTER — Other Ambulatory Visit: Payer: Self-pay | Admitting: Family Medicine

## 2020-06-19 DIAGNOSIS — E782 Mixed hyperlipidemia: Secondary | ICD-10-CM

## 2020-06-19 DIAGNOSIS — I10 Essential (primary) hypertension: Secondary | ICD-10-CM

## 2020-07-16 ENCOUNTER — Other Ambulatory Visit: Payer: Self-pay

## 2020-07-16 ENCOUNTER — Ambulatory Visit (INDEPENDENT_AMBULATORY_CARE_PROVIDER_SITE_OTHER): Payer: Medicare Other | Admitting: Podiatry

## 2020-07-16 ENCOUNTER — Encounter: Payer: Self-pay | Admitting: Podiatry

## 2020-07-16 DIAGNOSIS — H04123 Dry eye syndrome of bilateral lacrimal glands: Secondary | ICD-10-CM | POA: Diagnosis not present

## 2020-07-16 DIAGNOSIS — M79674 Pain in right toe(s): Secondary | ICD-10-CM | POA: Diagnosis not present

## 2020-07-16 DIAGNOSIS — M79675 Pain in left toe(s): Secondary | ICD-10-CM | POA: Diagnosis not present

## 2020-07-16 DIAGNOSIS — H02123 Mechanical ectropion of right eye, unspecified eyelid: Secondary | ICD-10-CM | POA: Diagnosis not present

## 2020-07-16 DIAGNOSIS — Z794 Long term (current) use of insulin: Secondary | ICD-10-CM | POA: Diagnosis not present

## 2020-07-16 DIAGNOSIS — B351 Tinea unguium: Secondary | ICD-10-CM

## 2020-07-16 DIAGNOSIS — H11823 Conjunctivochalasis, bilateral: Secondary | ICD-10-CM | POA: Diagnosis not present

## 2020-07-16 DIAGNOSIS — H02126 Mechanical ectropion of left eye, unspecified eyelid: Secondary | ICD-10-CM | POA: Diagnosis not present

## 2020-07-16 DIAGNOSIS — E1165 Type 2 diabetes mellitus with hyperglycemia: Secondary | ICD-10-CM | POA: Diagnosis not present

## 2020-07-16 NOTE — Progress Notes (Signed)
This patient returns to my office for at risk foot care.  This patient requires this care by a professional since this patient will be at risk due to having type 2 diabetes.  This patient is unable to cut nails himself since the patient cannot reach his nails.These nails are painful walking and wearing shoes.  This patient presents for at risk foot care today.  General Appearance  Alert, conversant and in no acute stress.  Vascular  Dorsalis pedis and posterior tibial  pulses are palpable  bilaterally.  Capillary return is within normal limits  bilaterally. Temperature is within normal limits  bilaterally.  Neurologic  Senn-Weinstein monofilament wire test within normal limits  bilaterally. Muscle power within normal limits bilaterally.  Nails Thick disfigured discolored nails with subungual debris  from hallux to fifth toes bilaterally. No evidence of bacterial infection or drainage bilaterally.  Orthopedic  No limitations of motion  feet .  No crepitus or effusions noted.  No bony pathology or digital deformities noted.  Skin  normotropic skin with no porokeratosis noted bilaterally.  No signs of infections or ulcers noted.     Onychomycosis  Pain in right toes  Pain in left toes  Consent was obtained for treatment procedures.   Mechanical debridement of nails 1-5  bilaterally performed with a nail nipper.  Filed with dremel without incident.    Return office visit    9 weeks                  Told patient to return for periodic foot care and evaluation due to potential at risk complications.   Shayde Gervacio DPM  

## 2020-07-22 ENCOUNTER — Other Ambulatory Visit: Payer: Medicare Other

## 2020-07-25 ENCOUNTER — Other Ambulatory Visit: Payer: Medicare Other

## 2020-07-25 ENCOUNTER — Other Ambulatory Visit: Payer: Self-pay

## 2020-07-25 DIAGNOSIS — Z5181 Encounter for therapeutic drug level monitoring: Secondary | ICD-10-CM | POA: Diagnosis not present

## 2020-07-25 DIAGNOSIS — E782 Mixed hyperlipidemia: Secondary | ICD-10-CM | POA: Diagnosis not present

## 2020-07-25 DIAGNOSIS — Z125 Encounter for screening for malignant neoplasm of prostate: Secondary | ICD-10-CM | POA: Diagnosis not present

## 2020-07-25 DIAGNOSIS — E1165 Type 2 diabetes mellitus with hyperglycemia: Secondary | ICD-10-CM | POA: Diagnosis not present

## 2020-07-26 LAB — COMPREHENSIVE METABOLIC PANEL
ALT: 17 IU/L (ref 0–44)
AST: 16 IU/L (ref 0–40)
Albumin/Globulin Ratio: 2.6 — ABNORMAL HIGH (ref 1.2–2.2)
Albumin: 4.6 g/dL (ref 3.7–4.7)
Alkaline Phosphatase: 40 IU/L — ABNORMAL LOW (ref 44–121)
BUN/Creatinine Ratio: 16 (ref 10–24)
BUN: 20 mg/dL (ref 8–27)
Bilirubin Total: 0.8 mg/dL (ref 0.0–1.2)
CO2: 18 mmol/L — ABNORMAL LOW (ref 20–29)
Calcium: 9.7 mg/dL (ref 8.6–10.2)
Chloride: 101 mmol/L (ref 96–106)
Creatinine, Ser: 1.25 mg/dL (ref 0.76–1.27)
GFR calc Af Amer: 66 mL/min/{1.73_m2} (ref >59)
GFR calc non Af Amer: 57 mL/min/{1.73_m2} — ABNORMAL LOW (ref >59)
Globulin, Total: 1.8 g/dL (ref 1.5–4.5)
Glucose: 278 mg/dL — ABNORMAL HIGH (ref 65–99)
Potassium: 4.2 mmol/L (ref 3.5–5.2)
Sodium: 138 mmol/L (ref 134–144)
Total Protein: 6.4 g/dL (ref 6.0–8.5)

## 2020-07-26 LAB — CBC WITH DIFFERENTIAL/PLATELET
Basophils Absolute: 0 10*3/uL (ref 0.0–0.2)
Basos: 1 %
EOS (ABSOLUTE): 0.1 10*3/uL (ref 0.0–0.4)
Eos: 3 %
Hematocrit: 41.5 % (ref 37.5–51.0)
Hemoglobin: 13.9 g/dL (ref 13.0–17.7)
Immature Grans (Abs): 0 10*3/uL (ref 0.0–0.1)
Immature Granulocytes: 0 %
Lymphocytes Absolute: 1.4 10*3/uL (ref 0.7–3.1)
Lymphs: 30 %
MCH: 31.3 pg (ref 26.6–33.0)
MCHC: 33.5 g/dL (ref 31.5–35.7)
MCV: 94 fL (ref 79–97)
Monocytes Absolute: 0.5 10*3/uL (ref 0.1–0.9)
Monocytes: 9 %
Neutrophils Absolute: 2.8 10*3/uL (ref 1.4–7.0)
Neutrophils: 57 %
Platelets: 184 10*3/uL (ref 150–450)
RBC: 4.44 x10E6/uL (ref 4.14–5.80)
RDW: 12.1 % (ref 11.6–15.4)
WBC: 4.8 10*3/uL (ref 3.4–10.8)

## 2020-07-26 LAB — MICROALBUMIN / CREATININE URINE RATIO
Creatinine, Urine: 70.5 mg/dL
Microalb/Creat Ratio: 14 mg/g creat (ref 0–29)
Microalbumin, Urine: 9.9 ug/mL

## 2020-07-26 LAB — LIPID PANEL
Chol/HDL Ratio: 4.3 ratio (ref 0.0–5.0)
Cholesterol, Total: 111 mg/dL (ref 100–199)
HDL: 26 mg/dL — ABNORMAL LOW (ref >39)
LDL Chol Calc (NIH): 58 mg/dL (ref 0–99)
Triglycerides: 157 mg/dL — ABNORMAL HIGH (ref 0–149)
VLDL Cholesterol Cal: 27 mg/dL (ref 5–40)

## 2020-07-26 LAB — PSA: Prostate Specific Ag, Serum: 1.6 ng/mL (ref 0.0–4.0)

## 2020-07-26 LAB — TSH: TSH: 1.39 u[IU]/mL (ref 0.450–4.500)

## 2020-07-27 NOTE — Progress Notes (Signed)
Chief Complaint  Patient presents with  . Medicare Wellness    Non fasting AWV/CPE-no new concerns. -Seeing Dr.Kerr Wednesday and will have A1c. Thinks he did urine micro 90 days ago-I called for records they are faxing.    [name] is a 75 y.o. male who presents for annual wellness visit and follow-up on chronic medical conditions. See below for labs done prior to today's visit.  Hypertension follow-up:He is taking amlodipineand Losartan. He denies any side effects. He denies headaches, dizziness, chest pain, palpitations, edema or shortness of breath. BP's are running130-140/65-70 at home.  BP checked frequently at eye doctors, recalls it was 125/65 last week.  Hyperlipidemia follow-up: Patient is reportedly following a low-fat, low cholesterol diet.His diet hasn't changed.Compliant with taking his Crestor and Tricor, and denies medication side effects.  DM: managed by Dr. Buddy Duty.Records haven't been received from Dr. Buddy Duty since 10/2019, when A1c was 9%. He went in November, and has appt later this week. At his last visit his dose was increased to 80 U at night (from 70), 70 U in the morning of 70/30 (never restarted Ozempic, due to cost).  Sugars are running 225 in the morning.  Has only had 1 low value in the last 90d (50). Prev had more frequent hypoglycemia (every 2 weeks), less often now. Denies polydipsia, polyuria. Gets routine care at podiatrist.He denies any neuropathy or lesions. Last eye examwas 12/2019 He had 2 surgeries--he states he had cauterization for his dry eyes.  Got referred to Dr. Sabino Dick, Dr. Micheline Chapman for cosmetic surgery--tightened the eyes so that he could retain the fluid in the eyes better, done in June/July 2021 bilaterally.  It helped for a while, but has had some flares of dry eyes.  Last saw Dr. Lucita Ferrara a few weeks ago.  Dr. Buddy Duty records from 04/28/20 were received at the end of his visit.  Last A1c (04/28/20) was 9.3%, prior A1c was 8.2  in august. Urine microalb/Cr 15.1 Cr 1.24, urine with 3+ glu  ED:has been using sildenafil 20mg tablets;60mg  seems to be effective most of the time, not always.    OSA: Uses CPAP machine nightly. He feels better when using it. Denies any problems, tolerating it well. Feels refreshed when he wakes up.He continues to get benefit from CPAP use.  Possible lipoma at L occipital neck--hehasn't noticed any change since his last visit.  Obesity:He reports eating pretty healthy for breakfast and lunch, but has reported having hors d'heuvres (2 handfuls of unsalted cashews, or cheese and crackers--eating cheese and crackers less often), followed by a heavier dinner.   Continues to limit rice portions. Today he reports that 2 weeks ago he cut back on alcohol--only having 2 drinks/night on Fri and Sat nights, and a beer after golfing (rather than 2 daily).  He was hoping this helped his dry eyes.  While he doesn't notice any difference in his eyes, he notices his sugars are a little better. He also seems less hungry, not having dessert now that he doesn't have alcohol during the week.  Immunization History  Administered Date(s) Administered  . Influenza Split 03/31/2005, 04/01/2007, 04/21/2009, 04/08/2011, 04/19/2011, 03/01/2012, 03/07/2013, 01/31/2015, 03/12/2016, 03/31/2017  . Influenza, High Dose Seasonal PF 04/17/2014, 03/07/2016, 03/29/2017, 02/28/2018, 02/21/2019, 02/06/2020  . Influenza-Unspecified 04/07/2014, 02/04/2015  . PFIZER(Purple Top)SARS-COV-2 Vaccination 07/03/2019, 07/24/2019, 03/14/2020  . Pneumococcal Conjugate-13 01/22/2015  . Pneumococcal Polysaccharide-23 06/07/2004, 04/21/2005, 10/11/2011, 03/01/2012  . Td 06/07/2002, 04/22/2003  . Tdap 03/01/2012  . Zoster 04/21/2008   Last colonoscopy:06/2016 tubular adenoma,  repeat in 5 years Last PSA: Lab Results  Component Value Date   PSA1 1.6 07/25/2020   PSA1 2.1 07/19/2019   PSA1 1.8 07/03/2018   PSA 1.4 05/20/2017    PSA 1.8 06/15/2016   PSA 3.5 04/12/2016   Dentist: once yearly Ophtho: yearly (more often due to dry eyes) Exercise: walks30 minutes twice daily 2 days/week, 30 minutes each morning on the other 5 days/week.  Plays golf 2x/week Has some weights at home, uses them sporadically.  Other doctors caring for patient include: Dr. Buddy Duty for diabetes.  Dr.Scott for ophtho, and Dr. Lucita Ferrara  Oculofacial plastic surgery: Dr. Sabino Dick, Dr. Micheline Chapman  Dr. Mallie Mussel Danis(GI) Dr. Woodfin Ganja (dentist) Dr. Pearline Cables (dermatologist) Dr. Eulah Pont Dr. Redmond Baseman (ENT; cleans his ears)  Depression screen: negative Fall screen: negative Functional status survey unremarkable Mini-Cog screen: normal See full screens in Epic  End of Life Discussion: Patient hasa living will and medical power of attorney.    PMH, PSH, SH and FH reviewed  Outpatient Encounter Medications as of 07/28/2020  Medication Sig Note  . ACCU-CHEK FASTCLIX LANCETS MISC use three times a day WHEN CHECKING BLOOD SUGAR 10/10/2015: Received from: External Pharmacy  . ACCU-CHEK SMARTVIEW test strip 3 (three) times daily. for testing 10/10/2015: Received from: External Pharmacy  . amLODipine (NORVASC) 5 MG tablet TAKE 1 TABLET(5 MG) BY MOUTH DAILY   . B-D INS SYRINGE 0.5CC/31GX5/16 31G X 5/16" 0.5 ML MISC  12/24/2013: Received from: External Pharmacy  . BD INSULIN SYRINGE ULTRAFINE 31G X 15/64" 1 ML MISC  10/16/2014: Received from: External Pharmacy  . Continuous Blood Gluc Sensor (FREESTYLE LIBRE 2 SENSOR) MISC    . fenofibrate (TRICOR) 145 MG tablet TAKE 1 TABLET(145 MG) BY MOUTH DAILY   . insulin NPH-regular Human (NOVOLIN 70/30) (70-30) 100 UNIT/ML injection Novolin 70/30 U-100 Insulin 100 unit/mL subcutaneous suspension  INJECT 70 UNITS 30 MINUTES BEFORE BREAKFAST AND 70 UNITS BEFORE EVENING MEAL TWICE A DAY SUBCUTANEOUSLY 07/28/2020: 70 U in the morning, 80 U before dinner  . Lifitegrast (XIIDRA) 5 % SOLN Apply 1 drop to eye 2 (two)  times daily.   Marland Kitchen losartan (COZAAR) 100 MG tablet TAKE 1 TABLET(100 MG) BY MOUTH DAILY   . rosuvastatin (CRESTOR) 20 MG tablet TAKE 1 TABLET(20 MG) BY MOUTH DAILY   . sildenafil (REVATIO) 20 MG tablet Take 1 tablet (20 mg total) by mouth 3 (three) times daily. 01/21/2020: Takes 3 tablets prn  . SYSTANE ULTRA 0.4-0.3 % SOLN Apply 1 drop to eye 3 (three) times daily.   Marland Kitchen UNABLE TO FIND Droplet Insulin Syringe 1 mL 31 gauge x 15/64"   . [DISCONTINUED] EYSUVIS 0.25 % SUSP     Facility-Administered Encounter Medications as of 07/28/2020  Medication  . 0.9 %  sodium chloride infusion   Also taking a new eye drop for dry eyes, cannot recall the name  No Known Allergies  ROS: The patient denies anorexia, fever, headaches, vision loss, decreased hearing, ear pain, hoarseness, chest pain, palpitations, dizziness, syncope, dyspnea on exertion, cough, swelling, nausea, vomiting, constipation, melena, hematochezia, indigestion/heartburn, hematuria, incontinence, nocturia, weakened urine stream, dysuria, genital lesions, joint pains (just some morning stiffness, back), numbness, tingling, weakness, tremor, suspicious skin lesions, depression, anxiety, abnormal bleeding/bruising, or enlarged lymph nodes.  +erectile dysfunction  No change to soft tissue mass on back of neck. Dry eyes, burning discomfort.  Some back soreness/stiffnessin the mornings; takes advil prior to playing golf. No radiation of pain or weakness.   PHYSICAL EXAM:  BP 140/70  Pulse 80   Ht 5' 8.5" (1.74 m)   Wt 213 lb 3.2 oz (96.7 kg)   BMI 31.95 kg/m   134/70  Wt Readings from Last 3 Encounters:  07/28/20 213 lb 3.2 oz (96.7 kg)  01/21/20 210 lb 9.6 oz (95.5 kg)  07/19/19 212 lb (96.2 kg)    General Appearance:  Alert, cooperative, no distress, appears stated age   Head:  Normocephalic, without obvious abnormality, atraumatic   Eyes:  PERRL; mild conjunctival injection bilaterally, upper eyelashes on the right  pointing downward. EOM's intact, fundi benign   Ears:  Normal TM's and external ear canals Some scarring noted on left.  Nose:  Not examined, wearing mask due to COVID-19 pandemic  Throat:  Not examined, wearing mask due to COVID-19 pandemic   Neck:  Supple, no lymphadenopathy; thyroid: no enlargement/ tenderness/nodules; no carotid bruit or JVD. 3x2 cm soft tissue mass left posterior neck. Nontender, unchanged  Back:  Spine nontender, no curvature, ROM normal, no CVA tenderness   Lungs:  Clear to auscultation bilaterally without wheezes, rales or ronchi; respirations unlabored   Chest Wall:  No tenderness or deformity   Heart:  Regular rate and rhythm, S1 and S2 normal, no murmur, rub  or gallop   Breast Exam:  No chest wall tenderness, masses or gynecomastia   Abdomen:  Soft, non-tender, nondistended, normoactive bowel sounds, no masses, no hepatosplenomegaly   Genitalia:  Normal male external genitalia without lesions. Testicles without masses. No inguinal hernias.   Rectal:  Normal sphincter tone, no masses or tenderness; guaiac negative stool. Prostate smooth, no nodules, mildly enlarged.No nodule/mass.   Extremities:  No clubbing, cyanosis or edema   Pulses:  2+ and symmetric all extremities   Skin:  Skin color, texture, turgor normal, no rashes.Many hyperkeratotic lesions on BLE (some are verruccous, some possible AK's) Onychomycotic toenails. Dry skin on legs.  Lymph nodes:  Cervical, supraclavicular, inguinal and axillary nodes normal   Neurologic:  Normal strength, sensation and gait; reflexes 2+ and symmetric throughout     Psych:   Normal mood, affect, hygiene and grooming.  Diabetic foot exam: performed recently by podiatrist     Chemistry      Component Value Date/Time   NA 138 07/25/2020 0819   K 4.2 07/25/2020 0819   CL 101 07/25/2020 0819   CO2 18 (L) 07/25/2020 0819   BUN 20 07/25/2020 0819   CREATININE 1.25  07/25/2020 0819   CREATININE 1.20 (H) 05/20/2017 0821      Component Value Date/Time   CALCIUM 9.7 07/25/2020 0819   ALKPHOS 40 (L) 07/25/2020 0819   AST 16 07/25/2020 0819   ALT 17 07/25/2020 0819   BILITOT 0.8 07/25/2020 0819     Fasting glucose 278  Lab Results  Component Value Date   CHOL 111 07/25/2020   HDL 26 (L) 07/25/2020   LDLCALC 58 07/25/2020   TRIG 157 (H) 07/25/2020   CHOLHDL 4.3 07/25/2020   Lab Results  Component Value Date   WBC 4.8 07/25/2020   HGB 13.9 07/25/2020   HCT 41.5 07/25/2020   MCV 94 07/25/2020   PLT 184 07/25/2020   PSA 1.6  Lab Results  Component Value Date   TSH 1.390 07/25/2020     ASSESSMENT/PLAN:  Medicare annual wellness visit, subsequent  Essential hypertension, benign - cont current meds, daily exercise; weight loss and low Na diet encouraged - Plan: losartan (COZAAR) 100 MG tablet  Poorly controlled diabetes mellitus ([place]) - has f/u  with endo this week; encouraged him to continue to limit alcohol  Hypertension associated with diabetes ([place])  Hyperlipidemia associated with type 2 diabetes mellitus ([facility])  OSA on CPAP - cont CPAP.  weight loss encouraged  Type 2 diabetes mellitus with hyperglycemia, with long-term current use of insulin ([facility]) - Plan: losartan (COZAAR) 100 MG tablet  Type 2 diabetes mellitus with hyperglycemia, with long-term current use of insulin ([facility]) - per Dr. Buddy Duty. Rec checking other times of day (morning/after dinner or HS), record and bring to endo f/u. Cut back on alcohol; wt loss rec - Plan: losartan (COZAAR) 100 MG tablet  Mixed hyperlipidemia - cont Crestor, Tricor.  TG borderline elevated. Cont lowfat, low cholesterol diet - Plan: rosuvastatin (CRESTOR) 20 MG tablet  Class 1 obesity due to excess calories with serious comorbidity and body mass index (BMI) of 31.0 to 31.9 in adult - comorbidities--DM, HTN, HLD  Chronic bilateral low back pain without sciatica - shown stretches. Encouraged core  strengthening, stretching, wt loss    Recommended at least 30 minutes of aerobic activity at least 5 days/week, weight-bearing exercise 2x/week; proper sunscreen use reviewed; healthy diet and alcohol recommendations (less than or equal to 2 drinks/day) reviewed; regular seatbelt use; changing batteries in smoke detectors. Immunization recommendations discussed,UTD;continuehigh dose flu shots yearly.  Shingrix recommended, needs to get from pharmacy. Discussed pneumovax booster--declined today, prefers to wait until next year.  TdaP will be due next year, from pharmacy. Colonoscopy recommendations reviewed--due again 06/2021.   Avoid NSAIDs, try tylenol prn pain (before golf) Discussed/taught stretches for back, encouraged core strengthening.  MOST form reviewed, Full Code, Full Care Asked to get [place] copies of his Living Will and healthcare POA.  6 mos med check, 1 year AWV   Medicare Attestation I have personally reviewed: The patient's medical and social history Their use of alcohol, tobacco or illicit drugs Their current medications and supplements The patient's functional ability including ADLs,fall risks, home safety risks, cognitive, and hearing and visual impairment Diet and physical activities Evidence for depression or mood disorders  The patient's weight, height, BMI have been recorded in the chart.  I have made referrals, counseling, and provided education to the patient based on review of the above and I have provided the patient with a written personalized care plan for preventive services.

## 2020-07-27 NOTE — Patient Instructions (Incomplete)
HEALTH MAINTENANCE RECOMMENDATIONS:  It is recommended that you get at least 30 minutes of aerobic exercise at least 5 days/week (for weight loss, you may need as much as 60-90 minutes). This can be any activity that gets your heart rate up. This can be divided in 10-15 minute intervals if needed, but try and build up your endurance at least once a week.  Weight bearing exercise is also recommended twice weekly.  Eat a healthy diet with lots of vegetables, fruits and fiber.  "Colorful" foods have a lot of vitamins (ie green vegetables, tomatoes, red peppers, etc).  Limit sweet tea, regular sodas and alcoholic beverages, all of which has a lot of calories and sugar.  Up to 2 alcoholic drinks daily may be beneficial for men (unless trying to lose weight, watch sugars).  Drink a lot of water.  Sunscreen of at least SPF 30 should be used on all sun-exposed parts of the skin when outside between the hours of 10 am and 4 pm (not just when at beach or pool, but even with exercise, golf, tennis, and yard work!)  Use a sunscreen that says "broad spectrum" so it covers both UVA and UVB rays, and make sure to reapply every 1-2 hours.  Remember to change the batteries in your smoke detectors when changing your clock times in the spring and fall.  Carbon monoxide detectors are recommended for your home.  Use your seat belt every time you are in a car, and please drive safely and not be distracted with cell phones and texting while driving.    [name] , Thank you for taking time to come for your Medicare Wellness Visit. I appreciate your ongoing commitment to your health goals. Please review the following plan we discussed and let me know if I can assist you in the future.    This is a list of the screening recommended for you and due dates:  Health Maintenance  Topic Date Due  . COVID-19 Vaccine (3 - Booster for Pfizer series) 01/21/2020  . Hemoglobin A1C  04/20/2020  . Eye exam for diabetics   12/24/2020  . Colon Cancer Screening  07/07/2021  . Complete foot exam   07/16/2021  . Tetanus Vaccine  03/01/2022  . Flu Shot  Completed  .  Hepatitis C: One time screening is recommended by Center for Disease Control  (CDC) for  adults born from [birth year] through 1964.   Completed  . Pneumonia vaccines  Completed    I recommend getting the new shingles vaccine (Shingrix). Since you have Medicare, you will need to get this from the pharmacy, as it is covered by Part D. This is a series of 2 injections, spaced 2 months apart.  It needs to be separated from other vaccines by at least 2-4 weeks.  I recommend checking with your eye doctors to see if any of them think that your CPAP could be affecting your dry eyes. If so, let me know and I can put in a referral to ENT for a consult to discuss Inspire to treat your sleep apnea instead.   Core Strength Exercises Ask your health care provider which exercises are safe for you. Do exercises exactly as told by your health care provider and adjust them as directed. It is normal to feel mild stretching, pulling, tightness, or discomfort as you do these exercises. Stop right away if you feel sudden pain or your pain gets worse. Do not begin these exercises until told by your  health care provider. Benefits of core strength exercises Core exercises help to build strength in the muscles between your ribs and your hips (abdominal muscles). These muscles help to support your body and keep your spine stable. It is important to maintain strength in your core to prevent injury and pain. Some activities, such as yoga and Pilates, can help to strengthen core muscles. You can also strengthen core muscles with exercises at home. It is important to talk to your health care provider before you start a new exercise routine. Core strength exercises can:  Reduce back pain.  Help to rebuild strength after a back or spine injury.  Help to prevent injury during physical  activity, especially injuries to the back, hips, and knees. How to do core strength exercises Repeat these exercises 10-15 times, or until you are tired. Stop if you feel any pain while doing these exercises. Contact your health care provider if your pain continues or gets worse while doing or after doing core strength exercises. For strength exercises that are done on the floor, use a padded yoga mat or an exercise mat. Bridging 1. Lie on your back on a firm surface with your knees bent and your feet flat on the floor. 2. Raise your hips so that your knees, hips, and shoulders together form a straight line. Do not excessively arch your back. Keep your abdominal muscles tight. 3. Hold this position for 3-5 seconds. 4. Slowly lower your hips to the starting position. 5. Let your muscles relax completely between repetitions.   Single-leg bridge 1. Lie on your back on a firm surface with your knees bent and your feet flat on the floor. 2. Raise your hips so that your knees, hips, and shoulders together form a straight line. Do not excessively arch your back. Keep your abdominal muscles tight. 3. Lift one foot off the floor while maintaining alignment in your knees, hips, and shoulders. Then, completely straighten the lifted leg. 4. Hold this position for 3-5 seconds. 5. Put the straight leg back down in the bent position. 6. Slowly lower your hips to the starting position. 7. Repeat these steps using your other leg. Side bridge 1. Lie on your side with your knees bent. Prop yourself up on the elbow that is near the floor. 2. Using your abdominal muscles and the elbow you are propped up on, raise your body off the floor. Raise your hip so that your shoulder, hip, and foot together form a straight line. 3. Hold this position for 10 seconds. Keep your head and neck raised and away from your shoulder (in their normal, neutral position). Keep your abdominal muscles tight. 4. Slowly lower your hip to  the starting position. 5. Repeat and try to hold this position longer, working your way up to 30 seconds. Abdominal crunch 1. Lie on your back on a firm surface. Bend your knees and keep your feet flat on the floor. 2. Cross your arms over your chest. 3. Without bending your neck, tip your chin slightly toward your chest. 4. Tighten your abdominal muscles as you lift your chest just high enough to lift your shoulder blades off the floor. Do not hold your breath. You can do this with short lifts or long lifts. 5. Slowly return to the starting position. Bird dog 1. Get on your hands and knees, with your legs shoulder-width apart and your arms under your shoulders. Keep your back straight. 2. Tighten your abdominal muscles. 3. Raise one of your legs  off the floor and straighten it. Try to keep it parallel to the floor. 4. Slowly lower your leg to the starting position. 5. Raise one of your arms off the floor and straighten it. Try to keep it parallel to the floor. 6. Slowly lower your arm to the starting position. 7. Repeat with the other arm and leg. If possible, try raising a leg and an arm at the same time, on opposite sides of the body. For example, raise your left hand and your right leg.   Plank 1. Lie on your belly. 2. Prop up your body onto your forearms and your feet, keeping your legs straight. Your body should make a straight line between your shoulders and feet. 3. Hold this position for 10 seconds while keeping your abdominal muscles tight. 4. Lower your body to the starting position. 5. Repeat and try to hold this position longer, working your way up to 30 seconds.   Cross-core strengthening 1. Stand with your feet shoulder-width apart. 2. Hold a ball out in front of you. Keep your arms straight. 3. Tighten your abdominal muscles and slowly rotate at your waist from side to side. Keep your feet flat. 4. Once you are comfortable, try repeating this exercise with a heavier  ball. Top core strengthening 1. Stand about 18 inches (46 cm) out from a wall, with your back to the wall. 2. Keep your feet flat and shoulder-width apart. 3. Tighten your abdominal muscles. 4. Bend your hips and knees. 5. Slowly reach between your legs to touch the wall behind you. 6. Slowly stand back up. 7. Raise your arms over your head and reach behind you. 8. Return to the starting position. General tips  Do not do any exercises that cause pain. If you have pain while exercising, talk to your health care provider.  Always stretch before and after doing these exercises. This can help prevent injury.  Maintain a healthy weight. Ask your health care provider what weight is healthy for you. Contact a health care provider if:  You have back pain that gets worse or does not go away.  You feel pain while doing core strength exercises. Get help right away if:  You have severe pain that does not get better with medicine. Summary  Core exercises help to build strength in the muscles between your ribs and your waist.  Core muscles help to support your body and keep your spine stable.  Some activities, such as yoga and Pilates, can help to strengthen core muscles.  Core strength exercises can help back pain and can prevent injury.  Stop if you feel any pain while doing core strength exercises. This information is not intended to replace advice given to you by your health care provider. Make sure you discuss any questions you have with your health care provider. Document Revised: 02/19/2020 Document Reviewed: 02/19/2020 Elsevier Patient Education  2021 [employer].

## 2020-07-28 ENCOUNTER — Ambulatory Visit (INDEPENDENT_AMBULATORY_CARE_PROVIDER_SITE_OTHER): Payer: Medicare Other | Admitting: Family Medicine

## 2020-07-28 ENCOUNTER — Other Ambulatory Visit: Payer: Self-pay

## 2020-07-28 ENCOUNTER — Encounter: Payer: Self-pay | Admitting: Family Medicine

## 2020-07-28 VITALS — BP 134/70 | HR 80 | Ht 68.5 in | Wt 213.2 lb

## 2020-07-28 DIAGNOSIS — E1165 Type 2 diabetes mellitus with hyperglycemia: Secondary | ICD-10-CM

## 2020-07-28 DIAGNOSIS — Z794 Long term (current) use of insulin: Secondary | ICD-10-CM

## 2020-07-28 DIAGNOSIS — M545 Low back pain, unspecified: Secondary | ICD-10-CM | POA: Diagnosis not present

## 2020-07-28 DIAGNOSIS — E782 Mixed hyperlipidemia: Secondary | ICD-10-CM | POA: Diagnosis not present

## 2020-07-28 DIAGNOSIS — Z9989 Dependence on other enabling machines and devices: Secondary | ICD-10-CM | POA: Diagnosis not present

## 2020-07-28 DIAGNOSIS — Z0000 Encounter for general adult medical examination without abnormal findings: Secondary | ICD-10-CM | POA: Diagnosis not present

## 2020-07-28 DIAGNOSIS — E785 Hyperlipidemia, unspecified: Secondary | ICD-10-CM

## 2020-07-28 DIAGNOSIS — E1159 Type 2 diabetes mellitus with other circulatory complications: Secondary | ICD-10-CM

## 2020-07-28 DIAGNOSIS — E6609 Other obesity due to excess calories: Secondary | ICD-10-CM

## 2020-07-28 DIAGNOSIS — E1169 Type 2 diabetes mellitus with other specified complication: Secondary | ICD-10-CM | POA: Diagnosis not present

## 2020-07-28 DIAGNOSIS — I152 Hypertension secondary to endocrine disorders: Secondary | ICD-10-CM | POA: Diagnosis not present

## 2020-07-28 DIAGNOSIS — G8929 Other chronic pain: Secondary | ICD-10-CM | POA: Diagnosis not present

## 2020-07-28 DIAGNOSIS — I10 Essential (primary) hypertension: Secondary | ICD-10-CM

## 2020-07-28 DIAGNOSIS — Z6831 Body mass index (BMI) 31.0-31.9, adult: Secondary | ICD-10-CM

## 2020-07-28 DIAGNOSIS — G4733 Obstructive sleep apnea (adult) (pediatric): Secondary | ICD-10-CM | POA: Diagnosis not present

## 2020-07-28 MED ORDER — ROSUVASTATIN CALCIUM 20 MG PO TABS: ORAL_TABLET | ORAL | 3 refills | Status: DC

## 2020-07-28 MED ORDER — LOSARTAN POTASSIUM 100 MG PO TABS: ORAL_TABLET | ORAL | 1 refills | Status: DC

## 2020-07-30 DIAGNOSIS — E669 Obesity, unspecified: Secondary | ICD-10-CM | POA: Diagnosis not present

## 2020-07-30 DIAGNOSIS — Z794 Long term (current) use of insulin: Secondary | ICD-10-CM | POA: Diagnosis not present

## 2020-07-30 DIAGNOSIS — E1165 Type 2 diabetes mellitus with hyperglycemia: Secondary | ICD-10-CM | POA: Diagnosis not present

## 2020-07-30 DIAGNOSIS — N1831 Chronic kidney disease, stage 3a: Secondary | ICD-10-CM | POA: Diagnosis not present

## 2020-08-07 ENCOUNTER — Other Ambulatory Visit: Payer: Self-pay | Admitting: Family Medicine

## 2020-08-07 DIAGNOSIS — I10 Essential (primary) hypertension: Secondary | ICD-10-CM

## 2020-08-07 DIAGNOSIS — E1165 Type 2 diabetes mellitus with hyperglycemia: Secondary | ICD-10-CM

## 2020-09-13 ENCOUNTER — Other Ambulatory Visit: Payer: Self-pay | Admitting: Family Medicine

## 2020-09-13 DIAGNOSIS — I10 Essential (primary) hypertension: Secondary | ICD-10-CM

## 2020-09-13 DIAGNOSIS — E782 Mixed hyperlipidemia: Secondary | ICD-10-CM

## 2020-09-24 ENCOUNTER — Encounter: Payer: Self-pay | Admitting: Podiatry

## 2020-09-24 ENCOUNTER — Ambulatory Visit (INDEPENDENT_AMBULATORY_CARE_PROVIDER_SITE_OTHER): Payer: Medicare Other | Admitting: Podiatry

## 2020-09-24 ENCOUNTER — Other Ambulatory Visit: Payer: Self-pay

## 2020-09-24 DIAGNOSIS — N1831 Chronic kidney disease, stage 3a: Secondary | ICD-10-CM | POA: Insufficient documentation

## 2020-09-24 DIAGNOSIS — M79675 Pain in left toe(s): Secondary | ICD-10-CM | POA: Diagnosis not present

## 2020-09-24 DIAGNOSIS — E1165 Type 2 diabetes mellitus with hyperglycemia: Secondary | ICD-10-CM

## 2020-09-24 DIAGNOSIS — B351 Tinea unguium: Secondary | ICD-10-CM | POA: Diagnosis not present

## 2020-09-24 DIAGNOSIS — E119 Type 2 diabetes mellitus without complications: Secondary | ICD-10-CM | POA: Insufficient documentation

## 2020-09-24 DIAGNOSIS — M79674 Pain in right toe(s): Secondary | ICD-10-CM

## 2020-09-24 DIAGNOSIS — Z794 Long term (current) use of insulin: Secondary | ICD-10-CM | POA: Diagnosis not present

## 2020-09-24 DIAGNOSIS — E669 Obesity, unspecified: Secondary | ICD-10-CM | POA: Insufficient documentation

## 2020-09-24 NOTE — Progress Notes (Signed)
This patient returns to my office for at risk foot care.  This patient requires this care by a professional since this patient will be at risk due to having type 2 diabetes.  This patient is unable to cut nails himself since the patient cannot reach his nails.These nails are painful walking and wearing shoes.  This patient presents for at risk foot care today.  General Appearance  Alert, conversant and in no acute stress.  Vascular  Dorsalis pedis and posterior tibial  pulses are palpable  bilaterally.  Capillary return is within normal limits  bilaterally. Temperature is within normal limits  bilaterally.  Neurologic  Senn-Weinstein monofilament wire test within normal limits  bilaterally. Muscle power within normal limits bilaterally.  Nails Thick disfigured discolored nails with subungual debris  from hallux to fifth toes bilaterally. No evidence of bacterial infection or drainage bilaterally.  Orthopedic  No limitations of motion  feet .  No crepitus or effusions noted.  No bony pathology or digital deformities noted.  Skin  normotropic skin with no porokeratosis noted bilaterally.  No signs of infections or ulcers noted.     Onychomycosis  Pain in right toes  Pain in left toes  Consent was obtained for treatment procedures.   Mechanical debridement of nails 1-5  bilaterally performed with a nail nipper.  Filed with dremel without incident.    Return office visit    9 weeks                  Told patient to return for periodic foot care and evaluation due to potential at risk complications.   Carthel Castille DPM  

## 2020-09-30 ENCOUNTER — Telehealth: Payer: Self-pay

## 2020-09-30 ENCOUNTER — Other Ambulatory Visit: Payer: Self-pay | Admitting: Family Medicine

## 2020-09-30 ENCOUNTER — Telehealth: Payer: Self-pay | Admitting: Family Medicine

## 2020-09-30 ENCOUNTER — Other Ambulatory Visit: Payer: Self-pay

## 2020-09-30 ENCOUNTER — Encounter: Payer: Self-pay | Admitting: Family Medicine

## 2020-09-30 ENCOUNTER — Telehealth (INDEPENDENT_AMBULATORY_CARE_PROVIDER_SITE_OTHER): Payer: Medicare Other | Admitting: Family Medicine

## 2020-09-30 DIAGNOSIS — U071 COVID-19: Secondary | ICD-10-CM

## 2020-09-30 DIAGNOSIS — R0602 Shortness of breath: Secondary | ICD-10-CM

## 2020-09-30 MED ORDER — PAXLOVID 20 X 150 MG & 10 X 100MG PO TBPK: 1.0000 | ORAL_TABLET | Freq: Two times a day (BID) | ORAL | 0 refills | Status: AC

## 2020-09-30 NOTE — Telephone Encounter (Signed)
This needs a virtual visit, or possibly e-visit if nobody has room today to see him. Other option would be for someone to put in the referral to the COVID treatment--they are doing prescriptions for oral meds as well as still doing infusions, not sure how they decide.

## 2020-09-30 NOTE — Progress Notes (Signed)
   Subjective:  Documentation for virtual audio and video telecommunications through [employer] encounter:  The patient was located at home. 2 patient identifiers used.  The provider was located in the office. The patient did consent to this visit and is aware of possible charges through their insurance for this visit.  The other persons participating in this telemedicine service were his wife. Time spent on call was 15 minutes and in review of previous records 20 minutes total.  This virtual service is not related to other E/M service within previous 7 days.   Patient ID: [name], male    DOB: 30-Jul-1945, 75 y.o.   MRN: [id number]  HPI Chief Complaint  Patient presents with  . Covid Positive    Had covid vaccine . Started feeling bad on sat. Tested Sunday and was negative and tested today it was positive.   States he had a positive Covid test today. Onset of symptoms 4 days ago. Symptoms include URI symptoms with nasal congestion, rhinorrhea, sneezing, and coughing. He has body aches and today has shortness of breath.   No fever, chills, chest pain, palpitations, abdominal pain, N/V/D.   He did receive 3 Covid vaccines.   Underlying DM, HL, HTN and OSA on CPAP.   Reviewed allergies, medications, past medical, surgical, family, and social history.    Review of Systems Pertinent positives and negatives in the history of present illness.     Objective:   Physical Exam Temp 98.5 F (36.9 C)   Wt 210 lb (95.3 kg)   BMI 31.47 kg/m   Alert and oriented in no acute distress.  Respirations unlabored.  He is able to speak in complete senses without difficulty.      Assessment & Plan:  COVID-19 virus infection - Plan: Ambulatory referral for Covid Treatment  Shortness of breath - Plan: Ambulatory referral for Covid Treatment  Discussed treatment options including referral to [facility] to determine if he meets criteria for IV monoclonal antibodies versus oral  antiviral.  I have referred him and discussed the fact that time is of the essence since today is day 4 of his symptoms.  He will need to start treatment by tomorrow which will be day 5.  Advised to continue with symptomatic treatment and to let me know if he does not hear from the [facility] clinic by 4 pm today and I will prescribe oral Paxlovid for him.

## 2020-09-30 NOTE — Telephone Encounter (Signed)
Pt. Called stating he wanted to let you know he tested positive for covid this morning. He started feeling bad Saturday and tested Sunday but it was Negative then(probably tested to early). He stated he has chest congestion, a lot of coughing, and a tickle in his throat. He orginally thought it was allergies because all of the pollen. He said he would like for you to call in the new [place] pill they have to treat covid with and he uses [facility] on [address]. I WILL BE LEAVING AT 10 AM TODAY SO PLEASE SEND RESPONSE BACK TO SOMEONE ELSE. Thank you.

## 2020-09-30 NOTE — Telephone Encounter (Signed)
Appt with Vickie today. [place]

## 2020-09-30 NOTE — Telephone Encounter (Signed)
Please call him and let him know that I will go ahead and send in a prescription for the oral antiviral called Paxlovid.  We need to find out if his pharmacy has this medication or where I can send it.  Let him know that if the [facility] calls him this evening and tells him to not start the medication then follow their instructions.

## 2020-09-30 NOTE — Telephone Encounter (Signed)
Pt was notified.  Please send med to [facility] and [facility] [address]

## 2020-09-30 NOTE — Telephone Encounter (Signed)
Pt called and stated that you told him to call back at 4 if he hadn't heard from the infusion clinic. He stated that if you call back and cant reach him he is having trouble with his phone but he will try to answer.

## 2020-10-01 ENCOUNTER — Telehealth (HOSPITAL_COMMUNITY): Payer: Self-pay

## 2020-10-01 NOTE — Telephone Encounter (Signed)
Called to discuss with patient about COVID-19 symptoms and the use of one of the available treatments for those with mild to moderate Covid symptoms and at a high risk of hospitalization.  Pt appears to qualify for outpatient treatment due to co-morbid conditions and/or a member of an at-risk group in accordance with the FDA Emergency Use Authorization.    Pt stated his MD called him in paxlovid last night and he is feeling better.   Janine Ores, RN

## 2020-10-28 DIAGNOSIS — Z794 Long term (current) use of insulin: Secondary | ICD-10-CM | POA: Diagnosis not present

## 2020-10-28 DIAGNOSIS — N1831 Chronic kidney disease, stage 3a: Secondary | ICD-10-CM | POA: Diagnosis not present

## 2020-10-28 DIAGNOSIS — E1165 Type 2 diabetes mellitus with hyperglycemia: Secondary | ICD-10-CM | POA: Diagnosis not present

## 2020-10-28 DIAGNOSIS — E669 Obesity, unspecified: Secondary | ICD-10-CM | POA: Diagnosis not present

## 2020-10-28 LAB — HEMOGLOBIN A1C: Hemoglobin A1C: 8.4

## 2020-11-05 ENCOUNTER — Encounter: Payer: Self-pay | Admitting: *Deleted

## 2020-11-05 DIAGNOSIS — H11823 Conjunctivochalasis, bilateral: Secondary | ICD-10-CM | POA: Diagnosis not present

## 2020-11-05 DIAGNOSIS — H02123 Mechanical ectropion of right eye, unspecified eyelid: Secondary | ICD-10-CM | POA: Diagnosis not present

## 2020-11-05 DIAGNOSIS — H0102A Squamous blepharitis right eye, upper and lower eyelids: Secondary | ICD-10-CM | POA: Diagnosis not present

## 2020-11-05 DIAGNOSIS — H04123 Dry eye syndrome of bilateral lacrimal glands: Secondary | ICD-10-CM | POA: Diagnosis not present

## 2020-11-07 ENCOUNTER — Ambulatory Visit: Payer: Medicare Other | Admitting: Podiatry

## 2020-12-09 ENCOUNTER — Encounter: Payer: Self-pay | Admitting: Podiatry

## 2020-12-09 ENCOUNTER — Ambulatory Visit (INDEPENDENT_AMBULATORY_CARE_PROVIDER_SITE_OTHER): Payer: Medicare Other | Admitting: Podiatry

## 2020-12-09 ENCOUNTER — Other Ambulatory Visit: Payer: Self-pay

## 2020-12-09 DIAGNOSIS — E1165 Type 2 diabetes mellitus with hyperglycemia: Secondary | ICD-10-CM

## 2020-12-09 DIAGNOSIS — Z794 Long term (current) use of insulin: Secondary | ICD-10-CM

## 2020-12-09 DIAGNOSIS — M79675 Pain in left toe(s): Secondary | ICD-10-CM

## 2020-12-09 DIAGNOSIS — M79674 Pain in right toe(s): Secondary | ICD-10-CM

## 2020-12-09 DIAGNOSIS — B351 Tinea unguium: Secondary | ICD-10-CM | POA: Diagnosis not present

## 2020-12-09 DIAGNOSIS — N1831 Chronic kidney disease, stage 3a: Secondary | ICD-10-CM

## 2020-12-09 NOTE — Progress Notes (Signed)
This patient returns to my office for at risk foot care.  This patient requires this care by a professional since this patient will be at risk due to having type 2 diabetes.  This patient is unable to cut nails himself since the patient cannot reach his nails.These nails are painful walking and wearing shoes.  This patient presents for at risk foot care today.  General Appearance  Alert, conversant and in no acute stress.  Vascular  Dorsalis pedis and posterior tibial  pulses are palpable  bilaterally.  Capillary return is within normal limits  bilaterally. Temperature is within normal limits  bilaterally.  Neurologic  Senn-Weinstein monofilament wire test within normal limits  bilaterally. Muscle power within normal limits bilaterally.  Nails Thick disfigured discolored nails with subungual debris  from hallux to fifth toes bilaterally. No evidence of bacterial infection or drainage bilaterally.  Orthopedic  No limitations of motion  feet .  No crepitus or effusions noted.  No bony pathology or digital deformities noted.  Skin  normotropic skin with no porokeratosis noted bilaterally.  No signs of infections or ulcers noted.     Onychomycosis  Pain in right toes  Pain in left toes  Consent was obtained for treatment procedures.   Mechanical debridement of nails 1-5  bilaterally performed with a nail nipper.  Filed with dremel without incident.    Return office visit    9 weeks                  Told patient to return for periodic foot care and evaluation due to potential at risk complications.   Rafay Dahan DPM  

## 2020-12-14 ENCOUNTER — Other Ambulatory Visit: Payer: Self-pay | Admitting: Family Medicine

## 2020-12-14 DIAGNOSIS — I10 Essential (primary) hypertension: Secondary | ICD-10-CM

## 2020-12-14 DIAGNOSIS — E782 Mixed hyperlipidemia: Secondary | ICD-10-CM

## 2020-12-29 DIAGNOSIS — H2513 Age-related nuclear cataract, bilateral: Secondary | ICD-10-CM | POA: Diagnosis not present

## 2020-12-29 LAB — HM DIABETES EYE EXAM

## 2021-01-12 DIAGNOSIS — H02889 Meibomian gland dysfunction of unspecified eye, unspecified eyelid: Secondary | ICD-10-CM | POA: Diagnosis not present

## 2021-01-25 NOTE — Progress Notes (Signed)
Chief Complaint  Patient presents with   Hypertension    Nonfasting med check. No concerns. Had diabetic eye exam with Dr. Nicki Reaper about 3 weeks ago-will call for report.     Patient presents for 6 month follow-up on chronic problems. Since his last visit, he had COVID (09/2020), fully recovered.  Hypertension follow-up: He is taking amlodipine and Losartan.  He denies any side effects. He denies headaches, dizziness, chest pain, palpitations, edema or shortness of breath. BP's are running 130/65 at home or other doctors.  Hyperlipidemia follow-up: Patient is reportedly following a low-fat, low cholesterol diet. His diet hasn't changed. Compliant with taking his Crestor and Tricor, and denies medication side effects.  LDL was at goal on last check, with low HDL and slightly elevated TG. He eats red meat every 2 weeks. Limits fried foods. Lab Results  Component Value Date   CHOL 111 07/25/2020   HDL 26 (L) 07/25/2020   LDLCALC 58 07/25/2020   TRIG 157 (H) 07/25/2020   CHOLHDL 4.3 07/25/2020    DM: managed by Dr. Buddy Duty.  Numbers have been improving, as the insulin dose (evening) is titrated up.  Last A1c was 8.5% in 10/2020. Currently he is taking 70/30, 90 U at night (up from 80 at last visit), 70 U in the morning. Sugars are ranging 60-200's.  He has only had <60 once or twice.  Average morning sugar is 180. He has f/u with Dr. Buddy Duty next month. Denies polydipsia, polyuria.  Gets routine care at podiatrist. He denies any neuropathy or lesions. Last eye exam in chart was 12/2019.  He saw Dr. 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  He is s/p cauterization for dry eyes.    ED: has been using sildenafil 55m tablets; 866mseems to be effective.     OSA: Uses CPAP machine nightly. He feels better when using it. Denies any problems, tolerating it well. Feels refreshed when he wakes up. He continues to get benefit from CPAP use.   Probable lipoma  at L occipital neck--he hasn't noticed any change since his last visit.   Obesity:  He reports eating pretty healthy for breakfast and lunch, but has reported having hors d'heuvres (2 handfuls of unsalted cashews, or cheese and crackers), followed by a heavier dinner.   He reports he has cut back on his food intake. Continues to limit rice portions. 6 months ago he had just cut back on alcohol--only having 2 drinks/night on Fri and Sat nights, and a beer after golfing (rather than 2 daily). He continues to follow this routine, and only has dessert 2 nights/week (small ice cream cone, 80 calories).    PMH, PSH, SH reviewed  Outpatient Encounter Medications as of 01/26/2021  Medication Sig Note   ACCU-CHEK FASTCLIX LANCETS MISC use three times a day WHEN CHECKING BLOOD SUGAR 10/10/2015: Received from: External Pharmacy   ACCU-CHEK SMARTVIEW test strip 3 (three) times daily. for testing 10/10/2015: Received from: External Pharmacy   amLODipine (NORVASC) 5 MG tablet TAKE 1 TABLET(5 MG) BY MOUTH DAILY    B-D INS SYRINGE 0.5CC/31GX5/16 31G X 5/16" 0.5 ML MISC  12/24/2013: Received from: External Pharmacy   BD INSULIN SYRINGE ULTRAFINE 31G X 15/64" 1 ML MISC  10/16/2014: Received from: External Pharmacy   Blood Glucose Monitoring Suppl (ACCU-CHEK GUIDE ME) w/Device KIT use to check blood sugar    fenofibrate (TRICOR) 145 MG tablet TAKE 1 TABLET(145 MG) BY MOUTH DAILY  insulin NPH-regular Human (NOVOLIN 70/30) (70-30) 100 UNIT/ML injection Novolin 70/30 U-100 Insulin 100 unit/mL subcutaneous suspension  INJECT 70 UNITS 30 MINUTES BEFORE BREAKFAST AND 70 UNITS BEFORE EVENING MEAL TWICE A DAY SUBCUTANEOUSLY 01/26/2021: 70 U in the morning, 90 U before dinner   Lifitegrast (XIIDRA) 5 % SOLN Apply 1 drop to eye 2 (two) times daily.    losartan (COZAAR) 100 MG tablet TAKE 1 TABLET(100 MG) BY MOUTH DAILY    rosuvastatin (CRESTOR) 20 MG tablet TAKE 1 TABLET(20 MG) BY MOUTH DAILY    sildenafil (REVATIO) 20 MG  tablet Take 1 tablet (20 mg total) by mouth 3 (three) times daily. 01/21/2020: Takes 3 tablets prn   SYSTANE ULTRA 0.4-0.3 % SOLN Apply 1 drop to eye 3 (three) times daily.    TYRVAYA 0.03 MG/ACT SOLN Place 1 spray into the nose in the morning and at bedtime.    UNABLE TO FIND Droplet Insulin Syringe 1 mL 31 gauge x 15/64"    [DISCONTINUED] Continuous Blood Gluc Sensor (FREESTYLE LIBRE 2 SENSOR) MISC     LOTEMAX SM 0.38 % GEL  (Patient not taking: Reported on 01/26/2021)    Facility-Administered Encounter Medications as of 01/26/2021  Medication   0.9 %  sodium chloride infusion   No Known Allergies  ROS: no fever, chills, headaches, dizziness, URI symptoms, shortness of breath, chest pain. Denies edema. Denies bleeding, bruising, rash, GI or GU complaints other than ED, which is controlled with meds). Moods are good. See HPI   PHYSICAL EXAM:  BP 130/70   Pulse 68   Ht 5' 8.5" (1.74 m)   Wt 211 lb 3.2 oz (95.8 kg)   BMI 31.65 kg/m   Wt Readings from Last 3 Encounters:  01/26/21 211 lb 3.2 oz (95.8 kg)  09/30/20 210 lb (95.3 kg)  07/28/20 213 lb 3.2 oz (96.7 kg)   Well developed, pleasant, overweight male in no distress, in good spirits HEENT: PERRL, EOMI, conjunctiva and sclera are clear. Wearing mask Neck: no lymphadenopathy, thyromegaly or carotid bruit. STS L neck unchanged, nontender, slightly firm (firmer than typical lipoma) Heart: regular rate and rhythm Lungs: clear bilaterally Back: no CVA tenderness, SI or spinal tenderness Abdomen: soft, nontender, no organomegaly or mass Extremities: no edema, normal pulses.  Psych: normal mood, affect, hygiene and grooming Neuro: alert and oriented, normal gait Skin: no visible rashes, normal turgor    ASSESSMENT/PLAN:  Essential hypertension, benign - cont current meds, daily exercise; weight loss and low Na diet encouraged - Plan: losartan (COZAAR) 100 MG tablet  Type 2 diabetes mellitus with hyperglycemia, with long-term  current use of insulin (HCC) - per Dr. Buddy Duty. Fasting sugars still high. f/u with Dr. Buddy Duty as planned in Sept - Plan: losartan (COZAAR) 100 MG tablet  Hypertension associated with diabetes ([place])  Hyperlipidemia associated with type 2 diabetes mellitus ([place])  Mixed hyperlipidemia - cont statin and lowfat diet, reviewed  Class 1 obesity due to excess calories with serious comorbidity and body mass index (BMI) of 31.0 to 31.9 in adult - counseled on diet, exercise, wt loss encouraged  Will get recent diabetic eye exam report.  F/u as scheduled in March Fasting labs prior--CBC, c-met, lipid, TSH, urine microalb, PSA

## 2021-01-26 ENCOUNTER — Ambulatory Visit (INDEPENDENT_AMBULATORY_CARE_PROVIDER_SITE_OTHER): Payer: Medicare Other | Admitting: Family Medicine

## 2021-01-26 ENCOUNTER — Encounter: Payer: Self-pay | Admitting: Family Medicine

## 2021-01-26 ENCOUNTER — Encounter: Payer: Self-pay | Admitting: *Deleted

## 2021-01-26 ENCOUNTER — Other Ambulatory Visit: Payer: Self-pay

## 2021-01-26 VITALS — BP 130/70 | HR 68 | Ht 68.5 in | Wt 211.2 lb

## 2021-01-26 DIAGNOSIS — Z5181 Encounter for therapeutic drug level monitoring: Secondary | ICD-10-CM | POA: Diagnosis not present

## 2021-01-26 DIAGNOSIS — I10 Essential (primary) hypertension: Secondary | ICD-10-CM | POA: Diagnosis not present

## 2021-01-26 DIAGNOSIS — Z794 Long term (current) use of insulin: Secondary | ICD-10-CM | POA: Diagnosis not present

## 2021-01-26 DIAGNOSIS — Z6831 Body mass index (BMI) 31.0-31.9, adult: Secondary | ICD-10-CM | POA: Diagnosis not present

## 2021-01-26 DIAGNOSIS — E1165 Type 2 diabetes mellitus with hyperglycemia: Secondary | ICD-10-CM | POA: Diagnosis not present

## 2021-01-26 DIAGNOSIS — E1169 Type 2 diabetes mellitus with other specified complication: Secondary | ICD-10-CM | POA: Diagnosis not present

## 2021-01-26 DIAGNOSIS — E1159 Type 2 diabetes mellitus with other circulatory complications: Secondary | ICD-10-CM | POA: Diagnosis not present

## 2021-01-26 DIAGNOSIS — Z125 Encounter for screening for malignant neoplasm of prostate: Secondary | ICD-10-CM

## 2021-01-26 DIAGNOSIS — E785 Hyperlipidemia, unspecified: Secondary | ICD-10-CM | POA: Diagnosis not present

## 2021-01-26 DIAGNOSIS — I152 Hypertension secondary to endocrine disorders: Secondary | ICD-10-CM

## 2021-01-26 DIAGNOSIS — E782 Mixed hyperlipidemia: Secondary | ICD-10-CM | POA: Diagnosis not present

## 2021-01-26 DIAGNOSIS — E6609 Other obesity due to excess calories: Secondary | ICD-10-CM

## 2021-01-26 MED ORDER — LOSARTAN POTASSIUM 100 MG PO TABS: ORAL_TABLET | ORAL | 1 refills | Status: DC

## 2021-02-07 ENCOUNTER — Other Ambulatory Visit: Payer: Self-pay | Admitting: Family Medicine

## 2021-02-07 DIAGNOSIS — I10 Essential (primary) hypertension: Secondary | ICD-10-CM

## 2021-02-11 ENCOUNTER — Encounter: Payer: Self-pay | Admitting: Podiatry

## 2021-02-11 ENCOUNTER — Ambulatory Visit (INDEPENDENT_AMBULATORY_CARE_PROVIDER_SITE_OTHER): Payer: Medicare Other | Admitting: Podiatry

## 2021-02-11 ENCOUNTER — Other Ambulatory Visit: Payer: Self-pay

## 2021-02-11 DIAGNOSIS — B351 Tinea unguium: Secondary | ICD-10-CM

## 2021-02-11 DIAGNOSIS — N1831 Chronic kidney disease, stage 3a: Secondary | ICD-10-CM

## 2021-02-11 DIAGNOSIS — Z794 Long term (current) use of insulin: Secondary | ICD-10-CM

## 2021-02-11 DIAGNOSIS — M79675 Pain in left toe(s): Secondary | ICD-10-CM

## 2021-02-11 DIAGNOSIS — M79674 Pain in right toe(s): Secondary | ICD-10-CM | POA: Diagnosis not present

## 2021-02-11 DIAGNOSIS — E1165 Type 2 diabetes mellitus with hyperglycemia: Secondary | ICD-10-CM

## 2021-02-11 NOTE — Progress Notes (Signed)
This patient returns to my office for at risk foot care.  This patient requires this care by a professional since this patient will be at risk due to having type 2 diabetes.  This patient is unable to cut nails himself since the patient cannot reach his nails.These nails are painful walking and wearing shoes.  This patient presents for at risk foot care today.  General Appearance  Alert, conversant and in no acute stress.  Vascular  Dorsalis pedis and posterior tibial  pulses are palpable  bilaterally.  Capillary return is within normal limits  bilaterally. Temperature is within normal limits  bilaterally.  Neurologic  Senn-Weinstein monofilament wire test within normal limits  bilaterally. Muscle power within normal limits bilaterally.  Nails Thick disfigured discolored nails with subungual debris  from hallux to fifth toes bilaterally. No evidence of bacterial infection or drainage bilaterally.  Orthopedic  No limitations of motion  feet .  No crepitus or effusions noted.  No bony pathology or digital deformities noted.  Skin  normotropic skin with no porokeratosis noted bilaterally.  No signs of infections or ulcers noted.     Onychomycosis  Pain in right toes  Pain in left toes  Consent was obtained for treatment procedures.   Mechanical debridement of nails 1-5  bilaterally performed with a nail nipper.  Filed with dremel without incident.    Return office visit    9 weeks                  Told patient to return for periodic foot care and evaluation due to potential at risk complications.   Lethia Donlon DPM  

## 2021-02-13 DIAGNOSIS — E1165 Type 2 diabetes mellitus with hyperglycemia: Secondary | ICD-10-CM | POA: Diagnosis not present

## 2021-02-13 DIAGNOSIS — Z794 Long term (current) use of insulin: Secondary | ICD-10-CM | POA: Diagnosis not present

## 2021-02-13 DIAGNOSIS — N1831 Chronic kidney disease, stage 3a: Secondary | ICD-10-CM | POA: Diagnosis not present

## 2021-02-13 DIAGNOSIS — E669 Obesity, unspecified: Secondary | ICD-10-CM | POA: Diagnosis not present

## 2021-03-09 DIAGNOSIS — Z23 Encounter for immunization: Secondary | ICD-10-CM | POA: Diagnosis not present

## 2021-03-16 ENCOUNTER — Other Ambulatory Visit: Payer: Self-pay | Admitting: Family Medicine

## 2021-03-16 DIAGNOSIS — E782 Mixed hyperlipidemia: Secondary | ICD-10-CM

## 2021-03-23 ENCOUNTER — Other Ambulatory Visit: Payer: Self-pay | Admitting: Family Medicine

## 2021-03-23 DIAGNOSIS — I10 Essential (primary) hypertension: Secondary | ICD-10-CM

## 2021-03-26 DIAGNOSIS — H16211 Exposure keratoconjunctivitis, right eye: Secondary | ICD-10-CM | POA: Diagnosis not present

## 2021-03-26 DIAGNOSIS — H02532 Eyelid retraction right lower eyelid: Secondary | ICD-10-CM | POA: Diagnosis not present

## 2021-03-26 DIAGNOSIS — H16212 Exposure keratoconjunctivitis, left eye: Secondary | ICD-10-CM | POA: Diagnosis not present

## 2021-03-26 DIAGNOSIS — H02535 Eyelid retraction left lower eyelid: Secondary | ICD-10-CM | POA: Diagnosis not present

## 2021-03-26 DIAGNOSIS — L718 Other rosacea: Secondary | ICD-10-CM | POA: Diagnosis not present

## 2021-03-26 DIAGNOSIS — H119 Unspecified disorder of conjunctiva: Secondary | ICD-10-CM | POA: Diagnosis not present

## 2021-03-26 DIAGNOSIS — H16213 Exposure keratoconjunctivitis, bilateral: Secondary | ICD-10-CM | POA: Diagnosis not present

## 2021-03-26 DIAGNOSIS — H05243 Constant exophthalmos, bilateral: Secondary | ICD-10-CM | POA: Diagnosis not present

## 2021-05-04 ENCOUNTER — Encounter: Payer: Self-pay | Admitting: Family Medicine

## 2021-05-04 ENCOUNTER — Ambulatory Visit (INDEPENDENT_AMBULATORY_CARE_PROVIDER_SITE_OTHER): Payer: Medicare Other | Admitting: Family Medicine

## 2021-05-04 VITALS — BP 143/77 | HR 79 | Temp 101.0°F | Ht 68.5 in | Wt 210.0 lb

## 2021-05-04 DIAGNOSIS — J111 Influenza due to unidentified influenza virus with other respiratory manifestations: Secondary | ICD-10-CM

## 2021-05-04 MED ORDER — OSELTAMIVIR PHOSPHATE 75 MG PO CAPS: 75.0000 mg | ORAL_CAPSULE | Freq: Two times a day (BID) | ORAL | 0 refills | Status: DC

## 2021-05-04 NOTE — Patient Instructions (Addendum)
Take the Tamiflu twice daily for 5 days.  Drink plenty of water. Use tylenol as needed for any pain or fever. (You may use ibuprofen in between doses, if needed for control of fever or pain).  You can take guaifenesin (PLAIN mucinex) if needed for any thick mucus or phlegm, or any chest congestion. Continue the Delsym syrup as needed for cough (this is the DM component of robitussin DM and Mucinex DM, so be sure not to take any other cold medications containing dextromethorphan, so that you don't get extra of this medication which can be dangerous).  Monitor sugars (they may be very high due to the illness, or lower if you aren't eating. Please be careful to avoid hypoglycemia or very high sugars).

## 2021-05-04 NOTE — Progress Notes (Signed)
Start time: 1:29 End time: 1:43  Virtual Visit via Telephone Note  I connected with [name] on 05/04/21 by telephone and verified that I am speaking with the correct person using two identifiers. Patient reported inability to do video visit.  Location: Patient: home Provider: office   I discussed the limitations of evaluation and management by telemedicine and the availability of in person appointments. The patient expressed understanding and agreed to proceed.  History of Present Illness:  Chief Complaint  Patient presents with   Fever    PHONE CALL woke up yesterday with fever of 102. Today 101. Body aches and chest congeston and HA. Had family in for Thanksgiving from [place] and [place] and 5 of them have the flu. His symptoms began Saturday night. Sunday and today did covid tests and they were both negative.    24 people gathered for Thanksgiving at his house. 1 person was diagnosed with the flu on Wed, and a few others later that week.  His symptoms started Saturday 11/26 with fatigue.  Woke up 11/27 with fever 102, chest congestion.  +body aches. Cough is mainly nonproductive.  Blowing nose a lot, but is clear/white, not discolored.  Dull headache most of the day yesterday, not today.  Denies sinus headache. Denies chest pain (other than hurting with coughing), shortness of breath.  He has been taking Delsym at night, which was effective.  PMH, PSH, [place] reviewed  Outpatient Encounter Medications as of 05/04/2021  Medication Sig Note   ACCU-CHEK FASTCLIX LANCETS MISC use three times a day WHEN CHECKING BLOOD SUGAR 10/10/2015: Received from: External Pharmacy   ACCU-CHEK SMARTVIEW test strip 3 (three) times daily. for testing 10/10/2015: Received from: External Pharmacy   amLODipine (NORVASC) 5 MG tablet TAKE 1 TABLET(5 MG) BY MOUTH DAILY    B-D INS SYRINGE 0.5CC/31GX5/16 31G X 5/16" 0.5 ML MISC  12/24/2013: Received from: External Pharmacy   BD INSULIN SYRINGE ULTRAFINE  31G X 15/64" 1 ML MISC  10/16/2014: Received from: External Pharmacy   Blood Glucose Monitoring Suppl (ACCU-CHEK GUIDE ME) w/Device KIT use to check blood sugar    cycloSPORINE (RESTASIS) 0.05 % ophthalmic emulsion Place 1 drop into both eyes 2 (two) times daily.    dapagliflozin propanediol (FARXIGA) 5 MG TABS tablet Farxiga 5 mg tablet  TAKE 1 TABLET BY MOUTH EVERY DAY.    Dextromethorphan HBr (DELSYM PO) Take 10 mLs by mouth as needed. 05/04/2021: Last dose last night   fenofibrate (TRICOR) 145 MG tablet TAKE 1 TABLET(145 MG) BY MOUTH DAILY    ibuprofen (ADVIL) 200 MG tablet Take 400 mg by mouth every 6 (six) hours as needed. 05/04/2021: Last dose 11:30am   insulin NPH-regular Human (NOVOLIN 70/30) (70-30) 100 UNIT/ML injection Novolin 70/30 U-100 Insulin 100 unit/mL subcutaneous suspension  INJECT 70 UNITS 30 MINUTES BEFORE BREAKFAST AND 70 UNITS BEFORE EVENING MEAL TWICE A DAY SUBCUTANEOUSLY 05/04/2021: 40U in the am, and 80U before dinner   losartan (COZAAR) 100 MG tablet TAKE 1 TABLET(100 MG) BY MOUTH DAILY    oseltamivir (TAMIFLU) 75 MG capsule Take 1 capsule (75 mg total) by mouth 2 (two) times daily.    rosuvastatin (CRESTOR) 20 MG tablet TAKE 1 TABLET(20 MG) BY MOUTH DAILY    SYSTANE ULTRA 0.4-0.3 % SOLN Apply 1 drop to eye 3 (three) times daily.    TYRVAYA 0.03 MG/ACT SOLN Place 1 spray into the nose in the morning and at bedtime.    UNABLE TO FIND Droplet Insulin Syringe 1  mL 31 gauge x 15/64"    LOTEMAX SM 0.38 % GEL  (Patient not taking: Reported on 01/26/2021) 05/04/2021: As needed    sildenafil (REVATIO) 20 MG tablet Take 1 tablet (20 mg total) by mouth 3 (three) times daily. (Patient not taking: Reported on 05/04/2021) 01/21/2020: Takes 3 tablets prn   [DISCONTINUED] Lifitegrast (XIIDRA) 5 % SOLN Apply 1 drop to eye 2 (two) times daily.    Facility-Administered Encounter Medications as of 05/04/2021  Medication   0.9 %  sodium chloride infusion   NOT taking tamiflu prior to  today's visit  No Known Allergies  ROS:  fever, HA, cough, myalgias per HPI. Denies n/v.  Had slight diarrhea yesterday. Hasn't checked his sugar since sick   Observations/Objective:  BP (!) 143/77   Pulse 79   Temp (!) 101 F (38.3 C) (Oral)   Ht 5' 8.5" (1.74 m)   Wt 210 lb (95.3 kg)   BMI 31.47 kg/m   Patient is alert, oriented, and speaking normally. He is in no distress No coughing during visit. Exam is limited due to virtual nature of the visit   Assessment and Plan:  Influenza - Plan: oseltamivir (TAMIFLU) 75 MG capsule  Take the Tamiflu twice daily for 5 days.  Drink plenty of water. Use tylenol as needed for any pain or fever. (You may use ibuprofen in between doses, if needed for control of fever or pain).  You can take guaifenesin (plain mucinex) if needed for any thick mucus or phlegm, or any chest congestion.   Monitor sugars (they may be very high due to the illness, or lower if you aren't eating. Please be careful to avoid hypoglycemia or very high sugars).    Follow Up Instructions:    I discussed the assessment and treatment plan with the patient. The patient was provided an opportunity to ask questions and all were answered. The patient agreed with the plan and demonstrated an understanding of the instructions.   The patient was advised to call back or seek an in-person evaluation if the symptoms worsen or if the condition fails to improve as anticipated.  I spent 16 minutes dedicated to the care of this patient, including pre-visit review of records, face to face time, post-visit ordering of testing and documentation.    Vikki Ports, MD

## 2021-05-08 ENCOUNTER — Encounter: Payer: Self-pay | Admitting: Podiatry

## 2021-05-08 ENCOUNTER — Other Ambulatory Visit: Payer: Self-pay

## 2021-05-08 ENCOUNTER — Ambulatory Visit (INDEPENDENT_AMBULATORY_CARE_PROVIDER_SITE_OTHER): Payer: Medicare Other | Admitting: Podiatry

## 2021-05-08 DIAGNOSIS — N1831 Chronic kidney disease, stage 3a: Secondary | ICD-10-CM

## 2021-05-08 DIAGNOSIS — M79675 Pain in left toe(s): Secondary | ICD-10-CM

## 2021-05-08 DIAGNOSIS — M79674 Pain in right toe(s): Secondary | ICD-10-CM | POA: Diagnosis not present

## 2021-05-08 DIAGNOSIS — B351 Tinea unguium: Secondary | ICD-10-CM

## 2021-05-08 DIAGNOSIS — Z794 Long term (current) use of insulin: Secondary | ICD-10-CM | POA: Diagnosis not present

## 2021-05-08 DIAGNOSIS — E1165 Type 2 diabetes mellitus with hyperglycemia: Secondary | ICD-10-CM | POA: Diagnosis not present

## 2021-05-08 NOTE — Progress Notes (Signed)
This patient returns to my office for at risk foot care.  This patient requires this care by a professional since this patient will be at risk due to having type 2 diabetes.  This patient is unable to cut nails himself since the patient cannot reach his nails.These nails are painful walking and wearing shoes.  This patient presents for at risk foot care today.  General Appearance  Alert, conversant and in no acute stress.  Vascular  Dorsalis pedis and posterior tibial  pulses are palpable  bilaterally.  Capillary return is within normal limits  bilaterally. Temperature is within normal limits  bilaterally.  Neurologic  Senn-Weinstein monofilament wire test within normal limits  bilaterally. Muscle power within normal limits bilaterally.  Nails Thick disfigured discolored nails with subungual debris  from hallux to fifth toes bilaterally. No evidence of bacterial infection or drainage bilaterally.  Orthopedic  No limitations of motion  feet .  No crepitus or effusions noted.  No bony pathology or digital deformities noted.  Skin  normotropic skin with no porokeratosis noted bilaterally.  No signs of infections or ulcers noted.     Onychomycosis  Pain in right toes  Pain in left toes  Consent was obtained for treatment procedures.   Mechanical debridement of nails 1-5  bilaterally performed with a nail nipper.  Filed with dremel without incident.    Return office visit    9 weeks                  Told patient to return for periodic foot care and evaluation due to potential at risk complications.   [name] DPM  

## 2021-05-26 ENCOUNTER — Other Ambulatory Visit: Payer: Self-pay | Admitting: Family Medicine

## 2021-05-26 DIAGNOSIS — Z794 Long term (current) use of insulin: Secondary | ICD-10-CM | POA: Diagnosis not present

## 2021-05-26 DIAGNOSIS — E782 Mixed hyperlipidemia: Secondary | ICD-10-CM

## 2021-05-26 DIAGNOSIS — E669 Obesity, unspecified: Secondary | ICD-10-CM | POA: Diagnosis not present

## 2021-05-26 DIAGNOSIS — N1831 Chronic kidney disease, stage 3a: Secondary | ICD-10-CM | POA: Diagnosis not present

## 2021-05-26 DIAGNOSIS — E1165 Type 2 diabetes mellitus with hyperglycemia: Secondary | ICD-10-CM | POA: Diagnosis not present

## 2021-05-26 LAB — HEMOGLOBIN A1C: Hemoglobin A1C: 8

## 2021-06-16 DIAGNOSIS — G4733 Obstructive sleep apnea (adult) (pediatric): Secondary | ICD-10-CM | POA: Diagnosis not present

## 2021-07-07 DIAGNOSIS — H6123 Impacted cerumen, bilateral: Secondary | ICD-10-CM | POA: Diagnosis not present

## 2021-07-10 ENCOUNTER — Encounter: Payer: Self-pay | Admitting: Podiatry

## 2021-07-10 ENCOUNTER — Other Ambulatory Visit: Payer: Self-pay

## 2021-07-10 ENCOUNTER — Ambulatory Visit (INDEPENDENT_AMBULATORY_CARE_PROVIDER_SITE_OTHER): Payer: Medicare HMO | Admitting: Podiatry

## 2021-07-10 DIAGNOSIS — E1165 Type 2 diabetes mellitus with hyperglycemia: Secondary | ICD-10-CM | POA: Diagnosis not present

## 2021-07-10 DIAGNOSIS — M79675 Pain in left toe(s): Secondary | ICD-10-CM | POA: Diagnosis not present

## 2021-07-10 DIAGNOSIS — N1831 Chronic kidney disease, stage 3a: Secondary | ICD-10-CM | POA: Diagnosis not present

## 2021-07-10 DIAGNOSIS — Z794 Long term (current) use of insulin: Secondary | ICD-10-CM

## 2021-07-10 DIAGNOSIS — B351 Tinea unguium: Secondary | ICD-10-CM | POA: Diagnosis not present

## 2021-07-10 DIAGNOSIS — M79674 Pain in right toe(s): Secondary | ICD-10-CM

## 2021-07-10 NOTE — Progress Notes (Signed)
This patient returns to my office for at risk foot care.  This patient requires this care by a professional since this patient will be at risk due to having type 2 diabetes.  This patient is unable to cut nails himself since the patient cannot reach his nails.These nails are painful walking and wearing shoes.  This patient presents for at risk foot care today.  General Appearance  Alert, conversant and in no acute stress.  Vascular  Dorsalis pedis and posterior tibial  pulses are palpable  bilaterally.  Capillary return is within normal limits  bilaterally. Temperature is within normal limits  bilaterally.  Neurologic  Senn-Weinstein monofilament wire test within normal limits  bilaterally. Muscle power within normal limits bilaterally.  Nails Thick disfigured discolored nails with subungual debris  from hallux to fifth toes bilaterally. No evidence of bacterial infection or drainage bilaterally.  Orthopedic  No limitations of motion  feet .  No crepitus or effusions noted.  No bony pathology or digital deformities noted.  Skin  normotropic skin with no porokeratosis noted bilaterally.  No signs of infections or ulcers noted.     Onychomycosis  Pain in right toes  Pain in left toes  Consent was obtained for treatment procedures.   Mechanical debridement of nails 1-5  bilaterally performed with a nail nipper.  Filed with dremel without incident.    Return office visit    9 weeks                  Told patient to return for periodic foot care and evaluation due to potential at risk complications.   Shalamar Plourde DPM  

## 2021-07-28 ENCOUNTER — Encounter: Payer: Self-pay | Admitting: Gastroenterology

## 2021-08-01 IMAGING — CT CT ORBITS W/O CM
1 series · 16 of 30 positions shown, 20 images · non-contrast
Comparison: None.

CLINICAL DATA: Bilateral proptosis for 7 months, evaluate for
thyroid orbital disease.

EXAM:
CT ORBITS WITHOUT CONTRAST
TECHNIQUE: Multidetector CT images were obtained using the standard protocol
without intravenous contrast.

[Series 4: orbits-soft · axial · 0.31mm/px · z∈[-195,-127]mm · 16 of 38 slices shown, 20 images]
[im 2/38  brain]
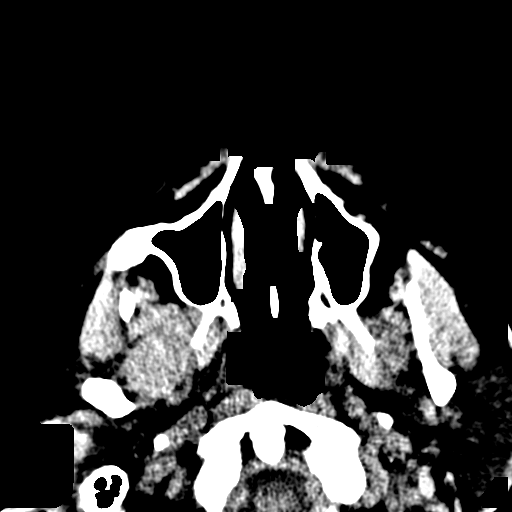
[im 2/38  bone]
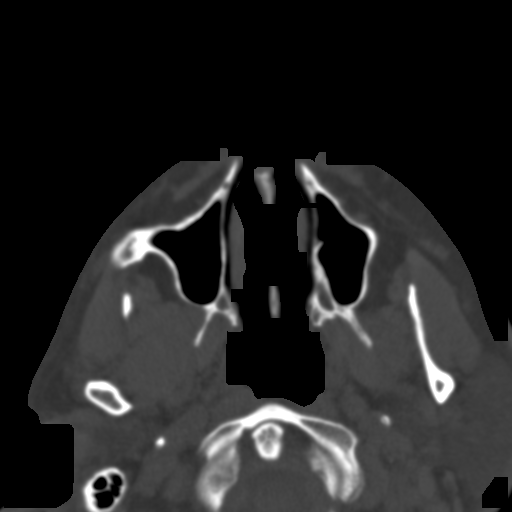
[im 4/38  bone]
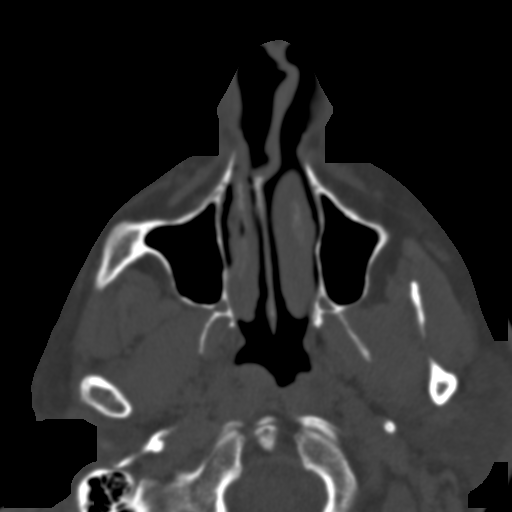
[im 7/38  bone]
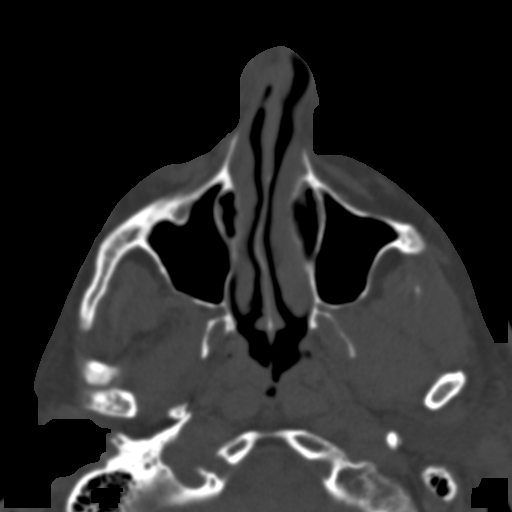
[im 9/38  bone]
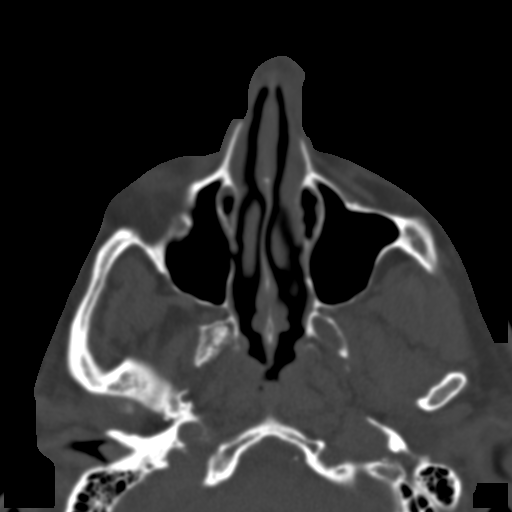
[im 11/38  brain]
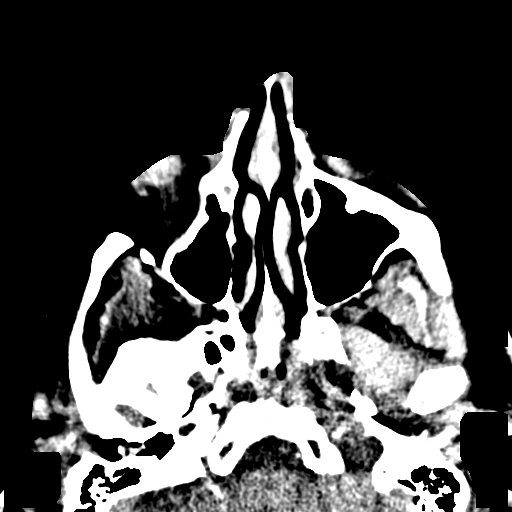
[im 11/38  bone]
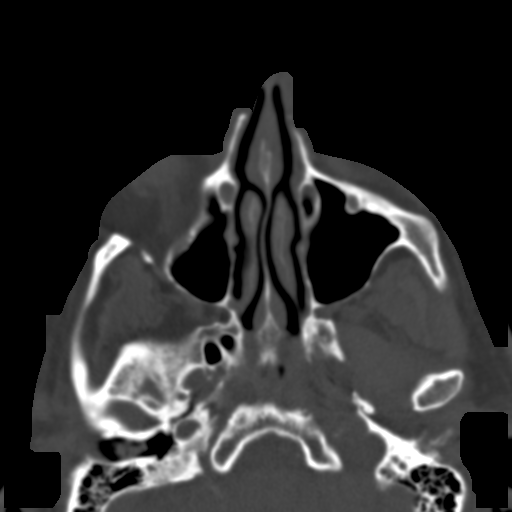
[im 13/38  bone]
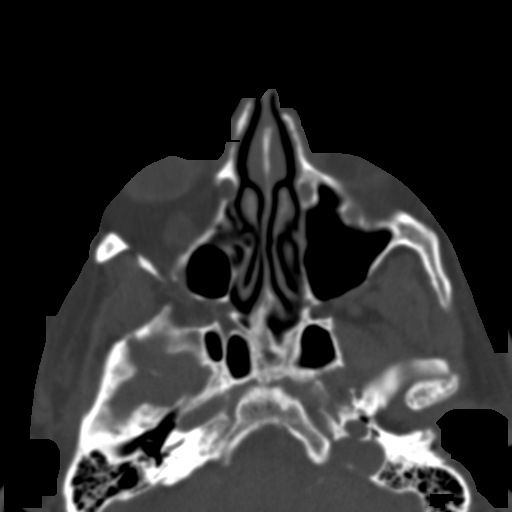
[im 16/38  bone]
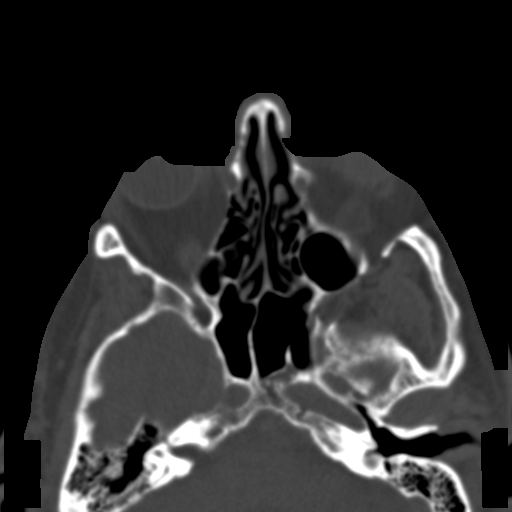
[im 18/38  bone]
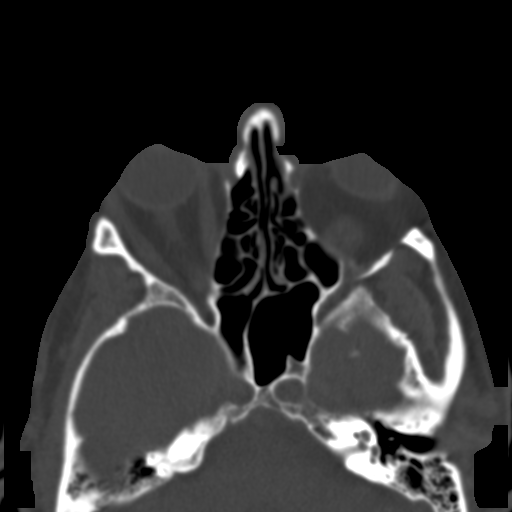
[im 20/38  brain]
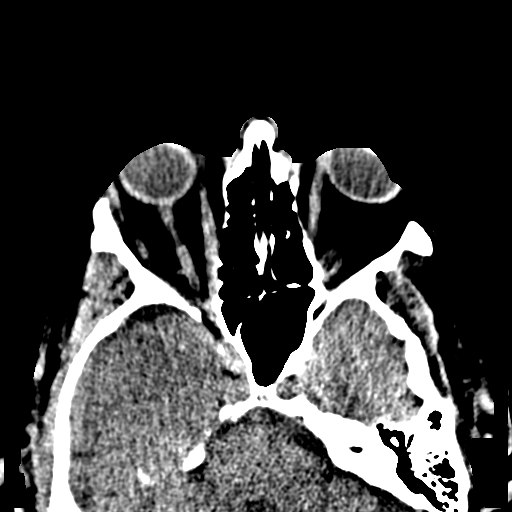
[im 20/38  bone]
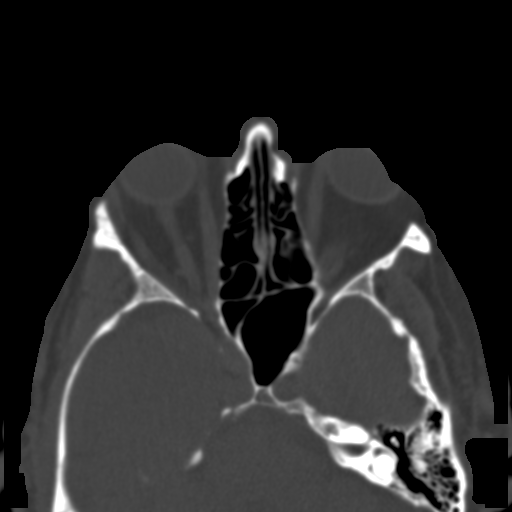
[im 22/38  bone]
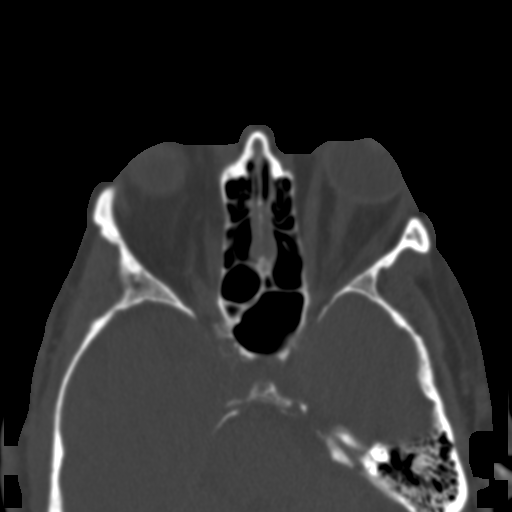
[im 25/38  bone]
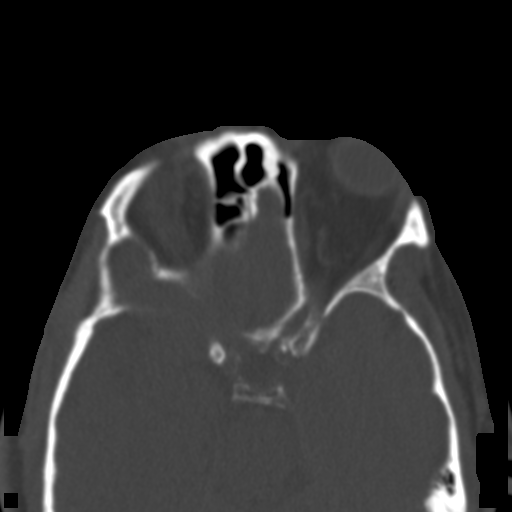
[im 27/38  bone]
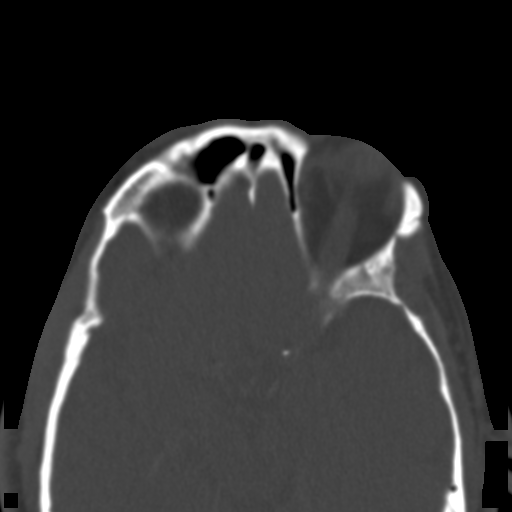
[im 29/38  brain]
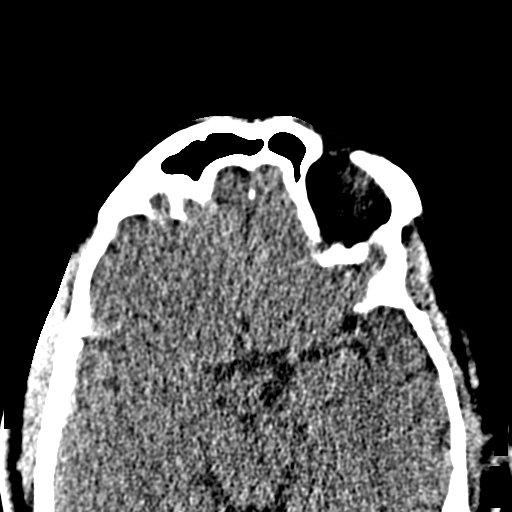
[im 29/38  bone]
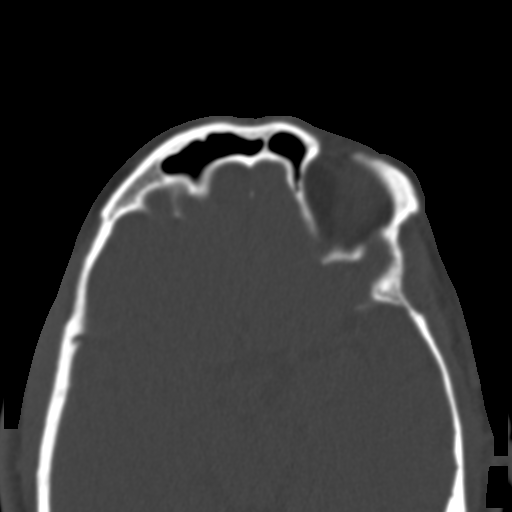
[im 31/38  bone]
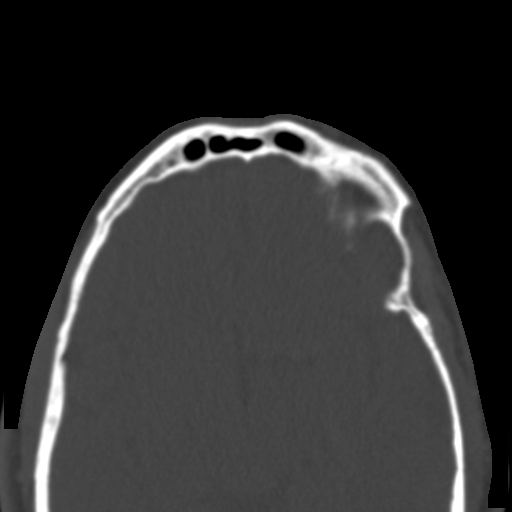
[im 34/38  bone]
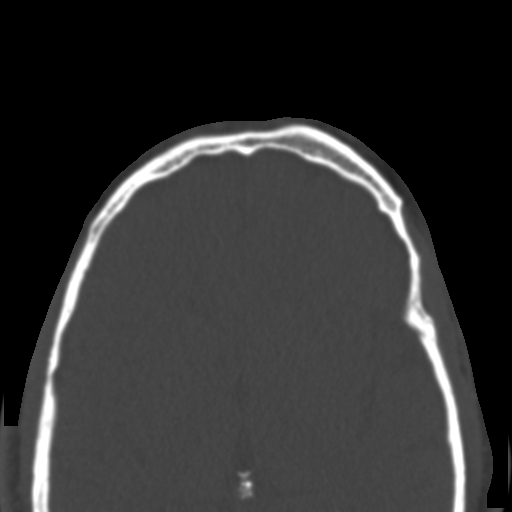
[im 36/38  bone]
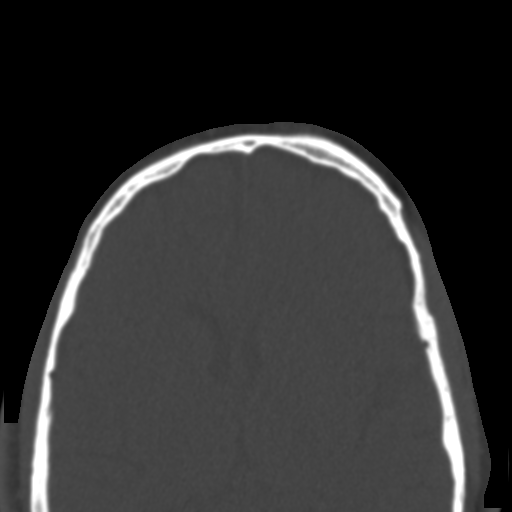

[16 of 30 positions shown; findings below may reference images not displayed]

FINDINGS: Orbits: Borderline proptosis, with measurement limited by obliquity
of the head.

Top-normal inferior rectus muscle thickness, 6.4 mm craniocaudal at
the inferior rectus on both sides. The lateral rectus is the least
thick of the extraocular muscles. No globe, lacrimal gland, or
superior ophthalmic vein abnormality. No fat inflammation.

Visualized sinuses: Clear

Soft tissues: Negative

Limited intracranial: Negative
IMPRESSION: Top normal inferior rectus muscle thickness on both sides, mild
thyroid orbitopathy may be present. No crowding at the orbital apex.

## 2021-08-06 ENCOUNTER — Ambulatory Visit: Payer: Medicare Other | Admitting: Family Medicine

## 2021-08-14 ENCOUNTER — Other Ambulatory Visit: Payer: Self-pay

## 2021-08-14 DIAGNOSIS — E1165 Type 2 diabetes mellitus with hyperglycemia: Secondary | ICD-10-CM

## 2021-08-14 DIAGNOSIS — I10 Essential (primary) hypertension: Secondary | ICD-10-CM

## 2021-08-14 MED ORDER — LOSARTAN POTASSIUM 100 MG PO TABS
ORAL_TABLET | ORAL | 1 refills | Status: DC
Start: 2021-08-14 — End: 2022-02-09

## 2021-08-21 DIAGNOSIS — H16213 Exposure keratoconjunctivitis, bilateral: Secondary | ICD-10-CM | POA: Diagnosis not present

## 2021-09-14 ENCOUNTER — Other Ambulatory Visit: Payer: Self-pay | Admitting: Family Medicine

## 2021-09-14 DIAGNOSIS — I10 Essential (primary) hypertension: Secondary | ICD-10-CM

## 2021-09-16 ENCOUNTER — Other Ambulatory Visit: Payer: Self-pay | Admitting: Family Medicine

## 2021-09-16 DIAGNOSIS — E782 Mixed hyperlipidemia: Secondary | ICD-10-CM

## 2021-09-16 NOTE — Telephone Encounter (Signed)
Patient has an appt in May  ?

## 2021-09-21 DIAGNOSIS — G4733 Obstructive sleep apnea (adult) (pediatric): Secondary | ICD-10-CM | POA: Diagnosis not present

## 2021-10-08 ENCOUNTER — Other Ambulatory Visit: Payer: Medicare HMO

## 2021-10-08 DIAGNOSIS — I10 Essential (primary) hypertension: Secondary | ICD-10-CM

## 2021-10-08 DIAGNOSIS — E782 Mixed hyperlipidemia: Secondary | ICD-10-CM

## 2021-10-08 DIAGNOSIS — Z5181 Encounter for therapeutic drug level monitoring: Secondary | ICD-10-CM

## 2021-10-08 DIAGNOSIS — Z125 Encounter for screening for malignant neoplasm of prostate: Secondary | ICD-10-CM | POA: Diagnosis not present

## 2021-10-08 DIAGNOSIS — Z794 Long term (current) use of insulin: Secondary | ICD-10-CM | POA: Diagnosis not present

## 2021-10-08 DIAGNOSIS — E1165 Type 2 diabetes mellitus with hyperglycemia: Secondary | ICD-10-CM | POA: Diagnosis not present

## 2021-10-09 ENCOUNTER — Ambulatory Visit (INDEPENDENT_AMBULATORY_CARE_PROVIDER_SITE_OTHER): Payer: Medicare HMO | Admitting: Podiatry

## 2021-10-09 ENCOUNTER — Encounter: Payer: Self-pay | Admitting: Podiatry

## 2021-10-09 DIAGNOSIS — Z794 Long term (current) use of insulin: Secondary | ICD-10-CM | POA: Diagnosis not present

## 2021-10-09 DIAGNOSIS — M79675 Pain in left toe(s): Secondary | ICD-10-CM | POA: Diagnosis not present

## 2021-10-09 DIAGNOSIS — B351 Tinea unguium: Secondary | ICD-10-CM | POA: Diagnosis not present

## 2021-10-09 DIAGNOSIS — E1165 Type 2 diabetes mellitus with hyperglycemia: Secondary | ICD-10-CM | POA: Diagnosis not present

## 2021-10-09 DIAGNOSIS — M79674 Pain in right toe(s): Secondary | ICD-10-CM | POA: Diagnosis not present

## 2021-10-09 DIAGNOSIS — N1831 Chronic kidney disease, stage 3a: Secondary | ICD-10-CM

## 2021-10-09 LAB — CBC WITH DIFFERENTIAL/PLATELET
Basophils Absolute: 0 10*3/uL (ref 0.0–0.2)
Basos: 1 %
EOS (ABSOLUTE): 0.1 10*3/uL (ref 0.0–0.4)
Eos: 2 %
Hematocrit: 47.1 % (ref 37.5–51.0)
Hemoglobin: 15.7 g/dL (ref 13.0–17.7)
Immature Grans (Abs): 0 10*3/uL (ref 0.0–0.1)
Immature Granulocytes: 1 %
Lymphocytes Absolute: 1.2 10*3/uL (ref 0.7–3.1)
Lymphs: 30 %
MCH: 31.3 pg (ref 26.6–33.0)
MCHC: 33.3 g/dL (ref 31.5–35.7)
MCV: 94 fL (ref 79–97)
Monocytes Absolute: 0.3 10*3/uL (ref 0.1–0.9)
Monocytes: 7 %
Neutrophils Absolute: 2.4 10*3/uL (ref 1.4–7.0)
Neutrophils: 59 %
Platelets: 195 10*3/uL (ref 150–450)
RBC: 5.02 x10E6/uL (ref 4.14–5.80)
RDW: 12.9 % (ref 11.6–15.4)
WBC: 3.9 10*3/uL (ref 3.4–10.8)

## 2021-10-09 LAB — COMPREHENSIVE METABOLIC PANEL
ALT: 28 IU/L (ref 0–44)
AST: 19 IU/L (ref 0–40)
Albumin/Globulin Ratio: 2.3 — ABNORMAL HIGH (ref 1.2–2.2)
Albumin: 5.1 g/dL — ABNORMAL HIGH (ref 3.7–4.7)
Alkaline Phosphatase: 44 IU/L (ref 44–121)
BUN/Creatinine Ratio: 23 (ref 10–24)
BUN: 32 mg/dL — ABNORMAL HIGH (ref 8–27)
Bilirubin Total: 0.9 mg/dL (ref 0.0–1.2)
CO2: 21 mmol/L (ref 20–29)
Calcium: 10.4 mg/dL — ABNORMAL HIGH (ref 8.6–10.2)
Chloride: 100 mmol/L (ref 96–106)
Creatinine, Ser: 1.4 mg/dL — ABNORMAL HIGH (ref 0.76–1.27)
Globulin, Total: 2.2 g/dL (ref 1.5–4.5)
Glucose: 228 mg/dL — ABNORMAL HIGH (ref 70–99)
Potassium: 4.7 mmol/L (ref 3.5–5.2)
Sodium: 141 mmol/L (ref 134–144)
Total Protein: 7.3 g/dL (ref 6.0–8.5)
eGFR: 52 mL/min/{1.73_m2} — ABNORMAL LOW (ref >59)

## 2021-10-09 LAB — MICROALBUMIN / CREATININE URINE RATIO
Creatinine, Urine: 59.6 mg/dL
Microalb/Creat Ratio: 28 mg/g creat (ref 0–29)
Microalbumin, Urine: 16.4 ug/mL

## 2021-10-09 LAB — LIPID PANEL
Chol/HDL Ratio: 4.6 ratio (ref 0.0–5.0)
Cholesterol, Total: 153 mg/dL (ref 100–199)
HDL: 33 mg/dL — ABNORMAL LOW (ref >39)
LDL Chol Calc (NIH): 89 mg/dL (ref 0–99)
Triglycerides: 176 mg/dL — ABNORMAL HIGH (ref 0–149)
VLDL Cholesterol Cal: 31 mg/dL (ref 5–40)

## 2021-10-09 LAB — PSA: Prostate Specific Ag, Serum: 1.8 ng/mL (ref 0.0–4.0)

## 2021-10-09 LAB — TSH: TSH: 1.22 u[IU]/mL (ref 0.450–4.500)

## 2021-10-09 NOTE — Progress Notes (Signed)
This patient returns to my office for at risk foot care.  This patient requires this care by a professional since this patient will be at risk due to having type 2 diabetes.  This patient is unable to cut nails himself since the patient cannot reach his nails.These nails are painful walking and wearing shoes.  This patient presents for at risk foot care today.  General Appearance  Alert, conversant and in no acute stress.  Vascular  Dorsalis pedis and posterior tibial  pulses are palpable  bilaterally.  Capillary return is within normal limits  bilaterally. Temperature is within normal limits  bilaterally.  Neurologic  Senn-Weinstein monofilament wire test within normal limits  bilaterally. Muscle power within normal limits bilaterally.  Nails Thick disfigured discolored nails with subungual debris  from hallux to fifth toes bilaterally. No evidence of bacterial infection or drainage bilaterally.  Orthopedic  No limitations of motion  feet .  No crepitus or effusions noted.  No bony pathology or digital deformities noted.  Skin  normotropic skin with no porokeratosis noted bilaterally.  No signs of infections or ulcers noted.     Onychomycosis  Pain in right toes  Pain in left toes  Consent was obtained for treatment procedures.   Mechanical debridement of nails 1-5  bilaterally performed with a nail nipper.  Filed with dremel without incident.    Return office visit    9 weeks                  Told patient to return for periodic foot care and evaluation due to potential at risk complications.   Elias Dennington DPM  

## 2021-10-11 NOTE — Patient Instructions (Addendum)
?HEALTH MAINTENANCE RECOMMENDATIONS: ? ?It is recommended that you get at least 30 minutes of aerobic exercise at least 5 days/week (for weight loss, you may need as much as 60-90 minutes). This can be any activity that gets your heart rate up. This can be divided in 10-15 minute intervals if needed, but try and build up your endurance at least once a week.  Weight bearing exercise is also recommended twice weekly. ? ?Eat a healthy diet with lots of vegetables, fruits and fiber.  "Colorful" foods have a lot of vitamins (ie green vegetables, tomatoes, red peppers, etc).  Limit sweet tea, regular sodas and alcoholic beverages, all of which has a lot of calories and sugar.  Up to 2 alcoholic drinks daily may be beneficial for men (unless trying to lose weight, watch sugars).  Drink a lot of water. ? ?Sunscreen of at least SPF 30 should be used on all sun-exposed parts of the skin when outside between the hours of 10 am and 4 pm (not just when at beach or pool, but even with exercise, golf, tennis, and yard work!)  Use a sunscreen that says "broad spectrum" so it covers both UVA and UVB rays, and make sure to reapply every 1-2 hours. ? ?Remember to change the batteries in your smoke detectors when changing your clock times in the spring and fall.  Carbon monoxide detectors are recommended for your home. ? ?Use your seat belt every time you are in a car, and please drive safely and not be distracted with cell phones and texting while driving. ? ? ? ?[name] , ?Thank you for taking time to come for your Medicare Wellness Visit. I appreciate your ongoing commitment to your health goals. Please review the following plan we discussed and let me know if I can assist you in the future.  ? ?This is a list of the screening recommended for you and due dates:  ?Health Maintenance  ?Topic Date Due  ? Zoster (Shingles) Vaccine (1 of 2) Never done  ? COVID-19 Vaccine (4 - Booster for Pfizer series) 05/09/2020  ? Hemoglobin A1C   04/30/2021  ? Colon Cancer Screening  07/07/2021  ? Eye exam for diabetics  12/29/2021  ? Flu Shot  01/05/2022  ? Tetanus Vaccine  03/01/2022  ? Complete foot exam   10/10/2022  ? Pneumonia Vaccine  Completed  ? Hepatitis C Screening: USPSTF Recommendation to screen - Ages 54-79 yo.  Completed  ? HPV Vaccine  Aged Out  ? ?Please contact Dr. Cindra Eves office again regarding the [place].  Your fasting sugars are too high, and insulin needs to be adjusted if you do not remain on that medication.  Maybe we/they can provide samples to get your through the donut hole? (We gave you 4 weeks supply today). ?Good control of diabetes is important to prevent cardiovascular disease, neuropathy, kidney disease, vision problems, and so much more. ? ?I recommend getting the new shingles vaccine (Shingrix). Since you have Medicare, you will need to get this from the pharmacy, as it is covered by Part D. As of 06/07/21, this no longer has a copay. ?This is a series of 2 injections, spaced 2 months apart.   ?This should be separated from other vaccines by at least 2 weeks. ? ?You are also now due for a tetanus booster (TdaP).  You also need to get this from the pharmacy (separated by 2 weeks from other vaccines). ? ?You are due for colonoscopy.  Please contact Dr.  Danis' office to schedule. ? ?Bivalent COVID booster is recommended.  There is one you can get now, and likely there will be an updated one in the Fall, which I recommend you get with your flu shot. ? ?Please bring [place] copies of your Living Will and [place] once completed and notarized so that it can be scanned into your medical chart. ? ?Your cholesterol was a little higher than in the past (LDL and the total chol/HDL ratio). ?Continue your current medications, but try and work on the diet some (limiting red meat, cheese, mayonnaise, creamy dressings/sauces, ice cream, egg yolks). ? ?We discussed the possibility of meeting with Maddy, or clinical pharmacist,  who may be able to help with looking at your medications, cost, etc. ? ?Please do not use ibuprofen, as this hurts the kidneys. ?Use tylenol instead. (Tylenol Arthritis lasts longer than regular or extra strength).  You can decide if you want to take this prior to golf, or wait and take it only if/when you have pain. ? ?

## 2021-10-11 NOTE — Progress Notes (Signed)
?Chief Complaint  ?Patient presents with  ? Medicare Wellness  ?  Nonfasting AWV/CPE. No new concerns. Has not yet gotten Tdap or Shingrix from pharmacy. Has not gotten any covid boosters and does not want any. He did get a notice from Dr. Loletha Carrow' office-he will call to schedule. Will take pneumo 23 today.   ? ? ?[name] is a 76 y.o. male who presents for annual wellness visit and follow-up on chronic medical conditions. See below for labs done prior to today's visit. ? ?Hypertension follow-up: He is taking amlodipine and Losartan.  He denies any side effects. He denies headaches, dizziness, chest pain, palpitations, edema or shortness of breath. ?BP's are running 130/60-70 ?BP Readings from Last 3 Encounters:  ?10/12/21 130/70  ?05/04/21 (!) 143/77  ?01/26/21 130/70  ? ?  ?Hyperlipidemia follow-up: Patient is reportedly following a low-fat, low cholesterol diet. Compliant with taking his Crestor,Tricor, and 3 fish oil daily (for his dry eyes), and denies medication side effects.  LDL was at goal on last check, with low HDL and slightly elevated TG. He eats red meat once a week in the spring/summer (was every 2 weeks at his last visit). Limits fried foods. ?See below for labs done prior to visit. ? ?DM: managed by Dr. Buddy Duty.  He was last seen in December, we weren't sent the notes.  Last A1c we have was 8.5% in 10/2020.  Called and they faxed over results (no notes)--8.0% in 05/2021. Urine microalbumin was also done then and was normal . ? ?Currently he is taking 70/30, 80 U at night, 40 U in the morning. Wilder Glade was added in December, 74m. At some point that was increased to 152m  He admits that he ran out last week, it was too expensive. He called their office, but hasn't gotten a return call.  He hasn't changed his insulin dose since stopping the [place]?Sugars are high in the morning, 180-200, running 100's in the evenings. ?He has had some hypoglycemia, 40's-50, in the afternoon (while playing golf),  only 2x in the last 3 months. He has glucose tablets with him. ?Denies polydipsia, polyuria.  ?Gets routine care at podiatrist. He denies any neuropathy or lesions. ?Last diabetic eye exam was 12/2020. ?He sees Dr. StLucita Ferraraegularly, who has been treating his dry eyes.  Last procedure helped some. ? ?ED: has been using sildenafil 2081mablets; previously reported that 53m84ms effective. He states this dose caused a headache. Last time he used just 40mg53md was effective.  Doesn't use often. ?  ?OSA: Uses CPAP machine nightly. He feels much better when using it. Denies any problems, tolerating it well. Feels refreshed when he wakes up. He continues to get benefit from CPAP use. ?  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 ?He has noted something at his R jaw, below his ear, a lump, noted when shaving.  He thinks it has been there 6-7 months. ?  ?Obesity:  He previously reported eating pretty healthy for breakfast and lunch, but had hors d'heuvres (2 handfuls of unsalted cashews, or cheese and crackers), followed by a heavier dinner.  He is eating a smaller dinner now.  He limits portions of rice/carbs. Occasional small sugar-free cookie, infrequent desserts. ?He has 1 drink a night, 2 on the weekends.  Occ beer after golfing in the summer. ? ? ?Immunization History  ?Administered Date(s) Administered  ? Influenza Split 03/31/2005, 04/01/2007, 04/21/2009, 04/08/2011, 04/19/2011, 03/01/2012, 03/07/2013, 01/31/2015,  03/12/2016, 03/31/2017  ? Influenza, High Dose Seasonal PF 04/17/2014, 03/07/2016, 03/29/2017, 02/28/2018, 02/21/2019, 02/06/2020, 02/05/2021  ? Influenza-Unspecified 04/07/2014, 02/04/2015  ? PFIZER(Purple Top)SARS-COV-2 Vaccination 07/03/2019, 07/24/2019, 03/14/2020  ? Pneumococcal Conjugate-13 01/22/2015  ? Pneumococcal Polysaccharide-23 06/07/2004, 04/21/2005, 10/11/2011, 03/01/2012  ? Td 06/07/2002, 04/22/2003  ? Tdap 03/01/2012  ? Zoster, Live  04/21/2008  ? ?Last colonoscopy: 06/2016 tubular adenoma, repeat in 5 years ?Last PSA: ?Lab Results  ?Component Value Date  ? PSA1 1.8 10/08/2021  ? PSA1 1.6 07/25/2020  ? PSA1 2.1 07/19/2019  ? PSA 1.4 05/20/2017  ? PSA 1.8 06/15/2016  ? PSA 3.5 04/12/2016  ? ?Dentist: once yearly ?Ophtho: yearly (more often due to dry eyes) ?Exercise: walks 30 minutes each morning. ?Plays golf 2x/week.  Uses weights and stretches 2-3x/week (before golf). ? ?Patient Care Team: ?Rita Ohara, MD as PCP - General (Family Medicine) ?Delrae Rend, MD as Consulting Physician (Endocrinology) ?   ?Dr.Scott for ophtho, and Dr. Lucita Ferrara  ?Oculofacial plastic surgery: Dr. Sabino Dick, Dr. Micheline Chapman  ?Dr. Wilfrid Lund (GI) ?Dr. Woodfin Ganja (dentist)  ?Dr. Pearline Cables (dermatologist) ?Dr. Mayer--podiatrist ?Dr. Redmond Baseman (ENT; cleans his ears) ? ?Depression Screening: ?[place] Office Visit from 10/12/2021 in [place]  ?PHQ-2 Total Score 0  ? ?  ?  ? ?Falls screen:  ? ?  10/12/2021  ?  8:37 AM 01/26/2021  ? 10:36 AM 07/28/2020  ?  9:47 AM 07/28/2020  ?  9:46 AM 01/21/2020  ? 10:58 AM  ?Fall Risk   ?Falls in the past year? 0 0 0 0 0  ?Number falls in past yr: 0 0     ?Injury with Fall? 0 0     ?Risk for fall due to : No Fall Risks No Fall Risks     ?Follow up Falls evaluation completed Falls evaluation completed     ?  ? ?Functional Status Survey: ?Is the patient deaf or have difficulty hearing?: No ?Does the patient have difficulty seeing, even when wearing glasses/contacts?: No ?Does the patient have difficulty concentrating, remembering, or making decisions?: No ?Does the patient have difficulty walking or climbing stairs?: No ?Does the patient have difficulty dressing or bathing?: No ?Does the patient have difficulty doing errands alone such as visiting a doctor's office or shopping?: No ? ?Mini-Cog Scoring: 5  ? ?End of Life Discussion:  Patient has a living will and medical power of attorney. We do not have these in chart. ?  ? ?PMH, PSH, SH and  FH reviewed ? ?Outpatient Encounter Medications as of 10/12/2021  ?Medication Sig Note  ? ACCU-CHEK FASTCLIX LANCETS MISC use three times a day WHEN CHECKING BLOOD SUGAR 10/10/2015: Received from: External Pharmacy  ? ACCU-CHEK SMARTVIEW test strip 3 (three) times daily. for testing 10/10/2015: Received from: External Pharmacy  ? amLODipine (NORVASC) 5 MG tablet TAKE ONE TABLET BY MOUTH DAILY   ? B-D INS SYRINGE 0.5CC/31GX5/16 31G X 5/16" 0.5 ML MISC  12/24/2013: Received from: External Pharmacy  ? BD INSULIN SYRINGE ULTRAFINE 31G X 15/64" 1 ML MISC  10/16/2014: Received from: External Pharmacy  ? Blood Glucose Monitoring Suppl (ACCU-CHEK GUIDE ME) w/Device KIT use to check blood sugar   ? cycloSPORINE (RESTASIS) 0.05 % ophthalmic emulsion Place 1 drop into both eyes 2 (two) times daily.   ? fenofibrate (TRICOR) 145 MG tablet TAKE ONE TABLET BY MOUTH DAILY   ? insulin NPH-regular Human (NOVOLIN 70/30) (70-30) 100 UNIT/ML injection Novolin 70/30 U-100 Insulin 100 unit/mL subcutaneous suspension ? INJECT 70 UNITS 30 MINUTES  BEFORE BREAKFAST AND 70 UNITS BEFORE EVENING MEAL TWICE A DAY SUBCUTANEOUSLY 05/04/2021: 40U in the am, and 80U before dinner  ? losartan (COZAAR) 100 MG tablet TAKE 1 TABLET(100 MG) BY MOUTH DAILY   ? LOTEMAX SM 0.38 % GEL  05/04/2021: As needed ?  ? rosuvastatin (CRESTOR) 20 MG tablet TAKE 1 TABLET(20 MG) BY MOUTH DAILY   ? SYSTANE ULTRA 0.4-0.3 % SOLN Apply 1 drop to eye 3 (three) times daily.   ? TYRVAYA 0.03 MG/ACT SOLN Place 1 spray into the nose in the morning and at bedtime.   ? UNABLE TO FIND Droplet Insulin Syringe 1 mL 31 gauge x 15/64"   ? [DISCONTINUED] FARXIGA 10 MG TABS tablet Take 10 mg by mouth daily.   ? sildenafil (REVATIO) 20 MG tablet Take 1 tablet (20 mg total) by mouth 3 (three) times daily. (Patient not taking: Reported on 10/12/2021) 01/21/2020: Takes 3 tablets prn  ? [DISCONTINUED] dapagliflozin propanediol (FARXIGA) 5 MG TABS tablet Farxiga 5 mg tablet ? TAKE 1 TABLET BY MOUTH EVERY  DAY. (Patient not taking: Reported on 10/12/2021) 10/12/2021: Stopped last week due to cost; contacted endo but hasn't heard back from their office  ? [DISCONTINUED] Dextromethorphan HBr (DELSYM PO) Take 10 mL

## 2021-10-12 ENCOUNTER — Encounter: Payer: Self-pay | Admitting: Family Medicine

## 2021-10-12 ENCOUNTER — Ambulatory Visit (INDEPENDENT_AMBULATORY_CARE_PROVIDER_SITE_OTHER): Payer: Medicare HMO | Admitting: Family Medicine

## 2021-10-12 ENCOUNTER — Other Ambulatory Visit: Payer: Self-pay | Admitting: *Deleted

## 2021-10-12 VITALS — BP 130/70 | HR 80 | Ht 68.0 in | Wt 207.2 lb

## 2021-10-12 DIAGNOSIS — Z0000 Encounter for general adult medical examination without abnormal findings: Secondary | ICD-10-CM | POA: Diagnosis not present

## 2021-10-12 DIAGNOSIS — G4733 Obstructive sleep apnea (adult) (pediatric): Secondary | ICD-10-CM | POA: Diagnosis not present

## 2021-10-12 DIAGNOSIS — E785 Hyperlipidemia, unspecified: Secondary | ICD-10-CM

## 2021-10-12 DIAGNOSIS — M278 Other specified diseases of jaws: Secondary | ICD-10-CM

## 2021-10-12 DIAGNOSIS — E1165 Type 2 diabetes mellitus with hyperglycemia: Secondary | ICD-10-CM | POA: Diagnosis not present

## 2021-10-12 DIAGNOSIS — I10 Essential (primary) hypertension: Secondary | ICD-10-CM

## 2021-10-12 DIAGNOSIS — E1159 Type 2 diabetes mellitus with other circulatory complications: Secondary | ICD-10-CM

## 2021-10-12 DIAGNOSIS — R221 Localized swelling, mass and lump, neck: Secondary | ICD-10-CM | POA: Diagnosis not present

## 2021-10-12 DIAGNOSIS — I152 Hypertension secondary to endocrine disorders: Secondary | ICD-10-CM | POA: Diagnosis not present

## 2021-10-12 DIAGNOSIS — Z23 Encounter for immunization: Secondary | ICD-10-CM

## 2021-10-12 DIAGNOSIS — Z9989 Dependence on other enabling machines and devices: Secondary | ICD-10-CM

## 2021-10-12 DIAGNOSIS — E782 Mixed hyperlipidemia: Secondary | ICD-10-CM

## 2021-10-12 DIAGNOSIS — E1169 Type 2 diabetes mellitus with other specified complication: Secondary | ICD-10-CM | POA: Diagnosis not present

## 2021-10-12 DIAGNOSIS — Z794 Long term (current) use of insulin: Secondary | ICD-10-CM

## 2021-10-12 MED ORDER — FARXIGA 10 MG PO TABS: 10.0000 mg | ORAL_TABLET | Freq: Every day | ORAL | 0 refills | Status: AC

## 2021-10-12 MED ORDER — FENOFIBRATE 145 MG PO TABS: ORAL_TABLET | ORAL | 3 refills | Status: DC

## 2021-10-12 MED ORDER — ROSUVASTATIN CALCIUM 20 MG PO TABS: 20.0000 mg | ORAL_TABLET | Freq: Every day | ORAL | 3 refills | Status: DC

## 2021-10-14 ENCOUNTER — Encounter: Payer: Self-pay | Admitting: Family Medicine

## 2021-11-03 ENCOUNTER — Telehealth: Payer: Self-pay | Admitting: Internal Medicine

## 2021-11-03 NOTE — Telephone Encounter (Signed)
[facility] called and wanted to find out if pt really needed to have a CT maxillofacial done along with the CT soft tissue. Soft tissues will reach the top of the ear down to the chest (heart).   [employer] [phone]

## 2021-11-03 NOTE — Telephone Encounter (Signed)
[name] will cancel this appt

## 2021-11-05 ENCOUNTER — Ambulatory Visit
Admission: RE | Admit: 2021-11-05 | Discharge: 2021-11-05 | Disposition: A | Discharge status: Home / Self Care | Payer: Medicare HMO | Source: Ambulatory Visit | Attending: Family Medicine | Admitting: Family Medicine

## 2021-11-05 ENCOUNTER — Inpatient Hospital Stay: Admission: RE | Admit: 2021-11-05 | Payer: Medicare Other | Source: Ambulatory Visit

## 2021-11-05 DIAGNOSIS — M47812 Spondylosis without myelopathy or radiculopathy, cervical region: Secondary | ICD-10-CM | POA: Diagnosis not present

## 2021-11-05 DIAGNOSIS — D170 Benign lipomatous neoplasm of skin and subcutaneous tissue of head, face and neck: Secondary | ICD-10-CM | POA: Diagnosis not present

## 2021-11-05 DIAGNOSIS — R221 Localized swelling, mass and lump, neck: Secondary | ICD-10-CM

## 2021-11-05 MED ORDER — IOPAMIDOL (ISOVUE-300) INJECTION 61%
75.0000 mL | Freq: Once | INTRAVENOUS | Status: AC | PRN
  Administered 2021-11-05: 75 mL via INTRAVENOUS

## 2021-11-10 ENCOUNTER — Telehealth: Payer: Self-pay

## 2021-11-10 NOTE — Telephone Encounter (Signed)
Pt. Called wanting to know the results of his CT. I told him the results were back but Dr. Tomi Bamberger is out of town until next week. He wanted to know if another Physician could review his CT and let him know what is going on with it.

## 2021-11-18 ENCOUNTER — Other Ambulatory Visit: Payer: Self-pay | Admitting: Family Medicine

## 2021-11-18 DIAGNOSIS — E782 Mixed hyperlipidemia: Secondary | ICD-10-CM

## 2021-11-24 DIAGNOSIS — E669 Obesity, unspecified: Secondary | ICD-10-CM | POA: Diagnosis not present

## 2021-11-24 DIAGNOSIS — N1831 Chronic kidney disease, stage 3a: Secondary | ICD-10-CM | POA: Diagnosis not present

## 2021-11-24 DIAGNOSIS — E1122 Type 2 diabetes mellitus with diabetic chronic kidney disease: Secondary | ICD-10-CM | POA: Diagnosis not present

## 2021-11-24 DIAGNOSIS — Z794 Long term (current) use of insulin: Secondary | ICD-10-CM | POA: Diagnosis not present

## 2021-11-24 LAB — HEMOGLOBIN A1C: Hemoglobin A1C: 7.8

## 2021-12-11 ENCOUNTER — Other Ambulatory Visit: Payer: Self-pay | Admitting: Family Medicine

## 2021-12-11 DIAGNOSIS — I10 Essential (primary) hypertension: Secondary | ICD-10-CM

## 2021-12-23 DIAGNOSIS — G4733 Obstructive sleep apnea (adult) (pediatric): Secondary | ICD-10-CM | POA: Diagnosis not present

## 2021-12-25 ENCOUNTER — Ambulatory Visit (INDEPENDENT_AMBULATORY_CARE_PROVIDER_SITE_OTHER): Payer: Medicare HMO | Admitting: Podiatry

## 2021-12-25 ENCOUNTER — Encounter: Payer: Self-pay | Admitting: Podiatry

## 2021-12-25 DIAGNOSIS — N1831 Chronic kidney disease, stage 3a: Secondary | ICD-10-CM | POA: Diagnosis not present

## 2021-12-25 DIAGNOSIS — M79675 Pain in left toe(s): Secondary | ICD-10-CM | POA: Diagnosis not present

## 2021-12-25 DIAGNOSIS — Z794 Long term (current) use of insulin: Secondary | ICD-10-CM | POA: Diagnosis not present

## 2021-12-25 DIAGNOSIS — B351 Tinea unguium: Secondary | ICD-10-CM | POA: Diagnosis not present

## 2021-12-25 DIAGNOSIS — M79674 Pain in right toe(s): Secondary | ICD-10-CM

## 2021-12-25 DIAGNOSIS — E1165 Type 2 diabetes mellitus with hyperglycemia: Secondary | ICD-10-CM | POA: Diagnosis not present

## 2021-12-25 NOTE — Progress Notes (Signed)
This patient returns to my office for at risk foot care.  This patient requires this care by a professional since this patient will be at risk due to having type 2 diabetes.  This patient is unable to cut nails himself since the patient cannot reach his nails.These nails are painful walking and wearing shoes.  This patient presents for at risk foot care today.  General Appearance  Alert, conversant and in no acute stress.  Vascular  Dorsalis pedis and posterior tibial  pulses are palpable  bilaterally.  Capillary return is within normal limits  bilaterally. Temperature is within normal limits  bilaterally.  Neurologic  Senn-Weinstein monofilament wire test within normal limits  bilaterally. Muscle power within normal limits bilaterally.  Nails Thick disfigured discolored nails with subungual debris  from hallux to fifth toes bilaterally. No evidence of bacterial infection or drainage bilaterally.  Orthopedic  No limitations of motion  feet .  No crepitus or effusions noted.  No bony pathology or digital deformities noted.  Skin  normotropic skin with no porokeratosis noted bilaterally.  No signs of infections or ulcers noted.     Onychomycosis  Pain in right toes  Pain in left toes  Consent was obtained for treatment procedures.   Mechanical debridement of nails 1-5  bilaterally performed with a nail nipper.  Filed with dremel without incident.    Return office visit    9 weeks                  Told patient to return for periodic foot care and evaluation due to potential at risk complications.   Alexsandro Salek DPM  

## 2021-12-29 DIAGNOSIS — H524 Presbyopia: Secondary | ICD-10-CM | POA: Diagnosis not present

## 2021-12-29 LAB — HM DIABETES EYE EXAM

## 2022-01-14 DIAGNOSIS — H0279 Other degenerative disorders of eyelid and periocular area: Secondary | ICD-10-CM | POA: Diagnosis not present

## 2022-01-14 DIAGNOSIS — D485 Neoplasm of uncertain behavior of skin: Secondary | ICD-10-CM | POA: Diagnosis not present

## 2022-01-14 DIAGNOSIS — H04123 Dry eye syndrome of bilateral lacrimal glands: Secondary | ICD-10-CM | POA: Diagnosis not present

## 2022-01-14 DIAGNOSIS — H02535 Eyelid retraction left lower eyelid: Secondary | ICD-10-CM | POA: Diagnosis not present

## 2022-01-14 DIAGNOSIS — H02532 Eyelid retraction right lower eyelid: Secondary | ICD-10-CM | POA: Diagnosis not present

## 2022-01-21 DIAGNOSIS — H6123 Impacted cerumen, bilateral: Secondary | ICD-10-CM | POA: Diagnosis not present

## 2022-01-28 ENCOUNTER — Other Ambulatory Visit: Payer: Self-pay

## 2022-01-28 DIAGNOSIS — R197 Diarrhea, unspecified: Secondary | ICD-10-CM

## 2022-01-29 ENCOUNTER — Other Ambulatory Visit (INDEPENDENT_AMBULATORY_CARE_PROVIDER_SITE_OTHER): Payer: Medicare HMO

## 2022-01-29 DIAGNOSIS — R197 Diarrhea, unspecified: Secondary | ICD-10-CM

## 2022-01-29 LAB — COMPREHENSIVE METABOLIC PANEL
ALT: 28 U/L (ref 0–53)
AST: 17 U/L (ref 0–37)
Albumin: 4.6 g/dL (ref 3.5–5.2)
Alkaline Phosphatase: 32 U/L — ABNORMAL LOW (ref 39–117)
BUN: 26 mg/dL — ABNORMAL HIGH (ref 6–23)
CO2: 25 mEq/L (ref 19–32)
Calcium: 9.6 mg/dL (ref 8.4–10.5)
Chloride: 103 mEq/L (ref 96–112)
Creatinine, Ser: 1.33 mg/dL (ref 0.40–1.50)
GFR: 52.36 mL/min — ABNORMAL LOW (ref >60.00)
Glucose, Bld: 252 mg/dL — ABNORMAL HIGH (ref 70–99)
Potassium: 4.1 mEq/L (ref 3.5–5.1)
Sodium: 137 mEq/L (ref 135–145)
Total Bilirubin: 0.9 mg/dL (ref 0.2–1.2)
Total Protein: 6.8 g/dL (ref 6.0–8.3)

## 2022-01-29 LAB — CBC WITH DIFFERENTIAL/PLATELET
Basophils Absolute: 0 10*3/uL (ref 0.0–0.1)
Basophils Relative: 0.4 % (ref 0.0–3.0)
Eosinophils Absolute: 0.1 10*3/uL (ref 0.0–0.7)
Eosinophils Relative: 1 % (ref 0.0–5.0)
HCT: 43.7 % (ref 39.0–52.0)
Hemoglobin: 14.8 g/dL (ref 13.0–17.0)
Lymphocytes Relative: 12.6 % (ref 12.0–46.0)
Lymphs Abs: 0.9 10*3/uL (ref 0.7–4.0)
MCHC: 33.9 g/dL (ref 30.0–36.0)
MCV: 93.7 fl (ref 78.0–100.0)
Monocytes Absolute: 0.5 10*3/uL (ref 0.1–1.0)
Monocytes Relative: 6.9 % (ref 3.0–12.0)
Neutro Abs: 5.5 10*3/uL (ref 1.4–7.7)
Neutrophils Relative %: 79.1 % — ABNORMAL HIGH (ref 43.0–77.0)
Platelets: 167 10*3/uL (ref 150.0–400.0)
RBC: 4.67 Mil/uL (ref 4.22–5.81)
RDW: 13.9 % (ref 11.5–15.5)
WBC: 7 10*3/uL (ref 4.0–10.5)

## 2022-02-01 ENCOUNTER — Other Ambulatory Visit: Payer: Medicare HMO

## 2022-02-01 DIAGNOSIS — R197 Diarrhea, unspecified: Secondary | ICD-10-CM

## 2022-02-02 LAB — GI PROFILE, STOOL, PCR

## 2022-02-03 ENCOUNTER — Other Ambulatory Visit: Payer: Self-pay

## 2022-02-03 ENCOUNTER — Telehealth: Payer: Self-pay | Admitting: Gastroenterology

## 2022-02-03 MED ORDER — DIPHENOXYLATE-ATROPINE 2.5-0.025 MG PO TABS: 1.0000 | ORAL_TABLET | Freq: Four times a day (QID) | ORAL | 0 refills | Status: DC | PRN

## 2022-02-03 NOTE — Telephone Encounter (Signed)
Prescription faxed to [facility]

## 2022-02-03 NOTE — Telephone Encounter (Signed)
[facility] calling to have rx lomotil sent to them and needs signature. Their fax is [phone]

## 2022-02-05 NOTE — Telephone Encounter (Signed)
See My Chart message 9/1

## 2022-02-05 NOTE — Telephone Encounter (Signed)
Patient called, states lomotil medication is not working for him. Requesting a call back as soon as possible. Please call to advise.

## 2022-02-06 ENCOUNTER — Other Ambulatory Visit: Payer: Self-pay | Admitting: Family Medicine

## 2022-02-06 DIAGNOSIS — E1165 Type 2 diabetes mellitus with hyperglycemia: Secondary | ICD-10-CM

## 2022-02-06 DIAGNOSIS — I10 Essential (primary) hypertension: Secondary | ICD-10-CM

## 2022-02-09 NOTE — Telephone Encounter (Signed)
Has appointment for 6 months coming up soon

## 2022-02-10 ENCOUNTER — Encounter: Payer: Self-pay | Admitting: Family Medicine

## 2022-02-10 ENCOUNTER — Encounter: Payer: Self-pay | Admitting: Internal Medicine

## 2022-02-10 ENCOUNTER — Ambulatory Visit (INDEPENDENT_AMBULATORY_CARE_PROVIDER_SITE_OTHER): Payer: Medicare HMO | Admitting: Family Medicine

## 2022-02-10 VITALS — BP 136/70 | HR 68 | Temp 98.3°F | Ht 68.0 in | Wt 207.2 lb

## 2022-02-10 DIAGNOSIS — R197 Diarrhea, unspecified: Secondary | ICD-10-CM | POA: Diagnosis not present

## 2022-02-10 DIAGNOSIS — R1032 Left lower quadrant pain: Secondary | ICD-10-CM | POA: Diagnosis not present

## 2022-02-10 MED ORDER — DICYCLOMINE HCL 10 MG PO CAPS: 10.0000 mg | ORAL_CAPSULE | Freq: Three times a day (TID) | ORAL | 0 refills | Status: DC

## 2022-02-10 MED ORDER — CHOLESTYRAMINE 4 G PO PACK: PACK | ORAL | 0 refills | Status: DC

## 2022-02-10 NOTE — Patient Instructions (Addendum)
Please take a daily probiotic.  (There are many kinds--Align, vs Florastar vs others). Try a lactose-free diet for 1-2 weeks and see if this helps. You may also want to cut back on gluten and see if this helps (if avoid dairy doesn't).  Avoid fried/greasy foods.

## 2022-02-10 NOTE — Progress Notes (Signed)
Chief Complaint  Patient presents with   Diarrhea    Has been having ongoing diarrhea x 3 weeks. Woke up doubled over in pain 2 weeks ago and went to the bathroom about 8-10 times (diarrhea). This happens every night between 2-5am. Started taking immodium around the 3rd day-didn't help. Called [facility] and he had stool studies and labs, everything was normal. Has appt set up with them. They have him on Lomotil, not helping. Feels like Velora Heckler is not taking good care of him and would like referral to Dr. Paulita Fujita or someone you recommend.    eye exam    He had diabetic eye exam last month- I will get.   3 weeks ago, at 2am, he woke up in the middle of the night with diarrhea. Had to go "every 3 minutes". He is fine all day, but goes about 9 times between 2-5am. Stools are loose, mostly watery. Sometimes chalky, sometimes black, mostly brown.  No blood or mucus. There is associated pain at the LLQ, intense during the night. Only temporary relief of pain with bowel movements.  He has "slight pain" during the day--at night he doubles over in pain.  No change in diet. No travel, camping, undercooked/spoiled foods, no antibiotics, nobody else sick.  He tried imodium, which didn't help. He was treated by  by message (hasn't been seen yet, appt scheduled). He is due for colonoscopy. He was prescribed Lomotil. That doesn't help either. He ran out today.  He had been taking 2 at a time, 4x/day since Friday, cut back to 1 when he started running low. At 11 today he was "feeling horrible"--pain, felt like he needed to go to the bathroom,but nothing is left, just feels bloated, gassy. Took imodium. Appointment isn't until 9/27 with Alonza Bogus, PA. She suggested adding Questran 1 packet/d, and lomotil prn. He didn't try the Questran, lomotil didn't seem to help much.  Stool studies were negative. Had normal CBC and c-met (other than glu 252) in late August.  PMH, PSH, SH reviewed  Outpatient  Encounter Medications as of 02/10/2022  Medication Sig Note   ACCU-CHEK FASTCLIX LANCETS MISC use three times a day WHEN CHECKING BLOOD SUGAR 10/10/2015: Received from: External Pharmacy   ACCU-CHEK SMARTVIEW test strip 3 (three) times daily. for testing 10/10/2015: Received from: External Pharmacy   amLODipine (NORVASC) 5 MG tablet TAKE ONE TABLET BY MOUTH DAILY    B-D INS SYRINGE 0.5CC/31GX5/16 31G X 5/16" 0.5 ML MISC  12/24/2013: Received from: External Pharmacy   BD INSULIN SYRINGE ULTRAFINE 31G X 15/64" 1 ML MISC  10/16/2014: Received from: External Pharmacy   Blood Glucose Monitoring Suppl (ACCU-CHEK GUIDE ME) w/Device KIT use to check blood sugar    cycloSPORINE (RESTASIS) 0.05 % ophthalmic emulsion Place 1 drop into both eyes 2 (two) times daily.    diphenoxylate-atropine (LOMOTIL) 2.5-0.025 MG tablet Take 1 tablet by mouth 4 (four) times daily as needed for diarrhea or loose stools. 02/10/2022: Took last one at 3am   FARXIGA 10 MG TABS tablet Take 1 tablet (10 mg total) by mouth daily.    fenofibrate (TRICOR) 145 MG tablet TAKE ONE TABLET BY MOUTH DAILY    insulin NPH-regular Human (NOVOLIN 70/30) (70-30) 100 UNIT/ML injection Novolin 70/30 U-100 Insulin 100 unit/mL subcutaneous suspension  INJECT 70 UNITS 30 MINUTES BEFORE BREAKFAST AND 70 UNITS BEFORE EVENING MEAL TWICE A DAY SUBCUTANEOUSLY 05/04/2021: 40U in the am, and 80U before dinner   losartan (COZAAR) 100 MG tablet TAKE ONE TABLET  BY MOUTH DAILY    rosuvastatin (CRESTOR) 20 MG tablet Take 1 tablet (20 mg total) by mouth daily.    SYSTANE ULTRA 0.4-0.3 % SOLN Apply 1 drop to eye 3 (three) times daily.    TYRVAYA 0.03 MG/ACT SOLN Place 1 spray into the nose in the morning and at bedtime.    UNABLE TO FIND Droplet Insulin Syringe 1 mL 31 gauge x 15/64"    LOTEMAX SM 0.38 % GEL  (Patient not taking: Reported on 02/10/2022) 05/04/2021: As needed    sildenafil (REVATIO) 20 MG tablet Take 1 tablet (20 mg total) by mouth 3 (three) times daily.  (Patient not taking: Reported on 02/10/2022) 01/21/2020: Takes 3 tablets prn   Facility-Administered Encounter Medications as of 02/10/2022  Medication   0.9 %  sodium chloride infusion   No Known Allergies  ROS: no f/c, nausea or vomiting. +diarrhea per HPI, along with intermittent LLQ pain. Few pound wt loss per his scale. Sugars remain above goal. No other complaints.    PHYSICAL EXAM:  BP 136/70   Pulse 68   Temp 98.3 F (36.8 C) (Tympanic)   Ht 5' 8"  (1.727 m)   Wt 207 lb 3.2 oz (94 kg)   BMI 31.50 kg/m   Wt Readings from Last 3 Encounters:  02/10/22 207 lb 3.2 oz (94 kg)  10/12/21 207 lb 3.2 oz (94 kg)  05/04/21 210 lb (95.3 kg)   Well-appearing, pleasant male in no distress HEENT: conjunctiva and sclera are clear, EOMI Neck: no lymphadenopathy, thyromegaly or mass Heart: regular rate and rhythm Lungs: clear bilaterally Abdomen: Obese, soft. No rebound/guarding or tenderness Area of pain is LLQ, nontender currently. Extremities: no edema Skin: normal turgor, no rash Psych: normal mood, affect, hygiene and grooming Neuro: alert and oriented, normal gait, strength   ASSESSMENT/PLAN:  Diarrhea, unspecified type - Ddx reviewed. No red flag symptoms. Check CBC and other labs--if abnormal, may need CT, presumptive ABX for diverticulitis - Plan: CBC with Differential/Platelet, Lipase, Sedimentation Rate, Celiac Panel, Basic metabolic panel, cholestyramine (QUESTRAN) 4 g packet  LLQ pain - Plan: CBC with Differential/Platelet, Lipase, Sedimentation Rate, dicyclomine (BENTYL) 10 MG capsule  Trial of Questran, as had been suggested by GI. Trial of Bentyl for pain, prn. Trial of lactose-free diet (and consider gluten-free diet after, if not improvement). Recommended use of probiotic daily.  F/u as scheduled with GI. May need CT if worsening pain, abnl labs.  I spent 34 minutes dedicated to the care of this patient, including pre-visit review of records, face to face  time, post-visit ordering of testing and documentation.

## 2022-02-11 ENCOUNTER — Encounter: Payer: Self-pay | Admitting: Family Medicine

## 2022-02-11 ENCOUNTER — Encounter: Payer: Self-pay | Admitting: *Deleted

## 2022-02-13 LAB — CBC WITH DIFFERENTIAL/PLATELET
Basophils Absolute: 0 10*3/uL (ref 0.0–0.2)
Basos: 1 %
EOS (ABSOLUTE): 0.1 10*3/uL (ref 0.0–0.4)
Eos: 2 %
Hematocrit: 43.2 % (ref 37.5–51.0)
Hemoglobin: 14.8 g/dL (ref 13.0–17.7)
Immature Grans (Abs): 0 10*3/uL (ref 0.0–0.1)
Immature Granulocytes: 1 %
Lymphocytes Absolute: 1 10*3/uL (ref 0.7–3.1)
Lymphs: 23 %
MCH: 32.2 pg (ref 26.6–33.0)
MCHC: 34.3 g/dL (ref 31.5–35.7)
MCV: 94 fL (ref 79–97)
Monocytes Absolute: 0.5 10*3/uL (ref 0.1–0.9)
Monocytes: 12 %
Neutrophils Absolute: 2.7 10*3/uL (ref 1.4–7.0)
Neutrophils: 61 %
Platelets: 207 10*3/uL (ref 150–450)
RBC: 4.6 x10E6/uL (ref 4.14–5.80)
RDW: 12.9 % (ref 11.6–15.4)
WBC: 4.4 10*3/uL (ref 3.4–10.8)

## 2022-02-13 LAB — BASIC METABOLIC PANEL
BUN/Creatinine Ratio: 15 (ref 10–24)
BUN: 19 mg/dL (ref 8–27)
CO2: 25 mmol/L (ref 20–29)
Calcium: 9.9 mg/dL (ref 8.6–10.2)
Chloride: 101 mmol/L (ref 96–106)
Creatinine, Ser: 1.24 mg/dL (ref 0.76–1.27)
Glucose: 219 mg/dL — ABNORMAL HIGH (ref 70–99)
Potassium: 4 mmol/L (ref 3.5–5.2)
Sodium: 137 mmol/L (ref 134–144)
eGFR: 61 mL/min/{1.73_m2} (ref >59)

## 2022-02-13 LAB — GLIA (IGA/G) + TTG IGA
Antigliadin Abs, IgA: 2 units (ref 0–19)
Gliadin IgG: 2 units (ref 0–19)
Transglutaminase IgA: <2 U/mL (ref 0–3)

## 2022-02-13 LAB — LIPASE: Lipase: 25 U/L (ref 13–78)

## 2022-02-13 LAB — SEDIMENTATION RATE: Sed Rate: 3 mm/hr (ref 0–30)

## 2022-02-16 ENCOUNTER — Encounter: Payer: Self-pay | Admitting: Family Medicine

## 2022-02-16 ENCOUNTER — Telehealth: Payer: Self-pay | Admitting: Gastroenterology

## 2022-02-16 NOTE — Telephone Encounter (Signed)
Inbound call from patients PCP needing a sooner appt for patient. States he's had diarrhea for 26 days straight. He is scheduled with APP 9/27, please advise.

## 2022-02-16 NOTE — Telephone Encounter (Signed)
Patient's appt has been rescheduled to Monday, 02/22/22 at 1:30 pm with Vicie Mutters, PA-C. Pt's wife wanted to know what the PA would do. I advised her that patient could not be scheduled for direct colonoscopy since he is having new symptoms. Pt would need office evaluation and Estill Bamberg would determine a treatment plan for him at that time. Pt and his wife are aware of new appt time and information. Pt and his wife verbalized understanding and had no concerns at the end of the call.   PCP's office notified.

## 2022-02-19 NOTE — Progress Notes (Unsigned)
02/22/2022 [name] [id number] Nov 01, 1945  Referring provider: Rita Ohara, MD Primary GI doctor: Dr. Loletha Carrow Olevia Perches)  ASSESSMENT AND PLAN:   Assessment: 76 y.o. male here for assessment of the following: 1. Diarrhea, unspecified type   2. Adenomatous polyp of colon, unspecified part of colon   3. Type 2 diabetes mellitus with hyperglycemia, with long-term current use of insulin ([place])    07/07/2016 colonoscopy due to personal history of adenomatous polyps showed diverticulosis, one 4 mm TA polyp distal transverse colon recall 5 years  Diarrhea at night for 28 days, no changes in medications, associated with AB pain but no hematochezia, no nausea or vomiting, no fever, chills. Negative GI pathogen panel. Normal CBC, CMET, celiac.  Patient does have diverticulosis, no AB pain today, diarrhea improving.  Uncertain if this was infectious, diverticulitis flare, but symptoms are resolving at this time, work up has been negative.   Plan: Due for colonoscopy this year, rule out microscopic colitis, if negative consider CT scan or consider autonomic dysfunction from DM and can try amitriptyline or low dose clonidine at night  We have discussed the risks of bleeding, infection, perforation, medication reactions, and remote risk of death associated with colonoscopy. All questions were answered and the patient acknowledges these risk and wishes to proceed.   History of Present Illness:  76 y.o. male  with a past medical history of hypertension, OSA on CPAP, type 2 diabetes, CKD stage III AA, history of adenomatous polyps and others listed below, returns to clinic today for evaluation of diarrhea and LLQ pain.  07/07/2016 colonoscopy due to personal history of adenomatous polyps showed diverticulosis, one 4 mm TA polyp distal transverse colon recall 5 years 01/28/2022 epic message for diarrhea in the middle the night 2-3 times in the morning abdominal pain.  Imodium not helping.   02/02/2022 patient said he is lost 7 pounds and diarrhea not improving. 02/03/2022 Lomotil given. 02/05/22 message stating still getting up in the middle of the night, 69 times a day with urgency.  Added on Questran packet once daily GI profile negative, CBC showed slight dehydration no elevation of liver function, white blood cell count normal, no anemia. 02/10/2022 office visit with PCP requested referral to Dr. Paulita Fujita. Patient states he is fine all day but between 2 to 5 AM will go about 9 times.  Sometimes chalky, black mostly brown.   Denies hematochezia or mucus. Lower quadrant abdominal pain associated with it at night, bowel movements temporarily relieve pain. Patient has not tried Questran yet suggested try that, trial of Bentyl, trial of lactose-free diet. 02/10/2022 Labs showed no leukocytosis, no anemia, BUN/creatinine improved, no dehydration, liver normal, lipase normal, negative celiac, normal sed rate.  Patient states he was waking up between 2-4 in the morning with diarrhea and AB pain, with 9-10 episodes of diarrhea, moderate to large volume, pressure and bloating, would wake up tired. He would not have any diarrhea during the day.  Denies hematochezia, melena, mucus.  Denies nausea, vomiting, fever, chills.  During the day he was okay, appetite okay, very rare pressure in his AB with fullness with feeling the need to go but was unable to go, felt like he could not have complete BM.  Had some weight loss per patient.  Then this past Friday he slept all night and had loose stools only 3 x  in the morning and same thing Saturday morning.  This morning the stool was more formed with going to BM a  few times before breakfast.  He has been on lactate, align, imodium, lomitil, questran did not help. Sugar has been elevated during this episode getting more controlled.  Normally he would have 1 BM in the morning after coffee, complete BM, no issues.  No SSRI, no PPI, rare NSAIDS once a  week.  He will have two scotches at night for years.  Patient describes episode last colon where he had to be woken up, or something happened, he is on CPAP nightly.   He  reports that he quit smoking about 35 years ago. His smoking use included cigarettes. He has never used smokeless tobacco. He reports current alcohol use of about 10.0 standard drinks of alcohol per week. He reports that he does not use drugs. His family history includes Alzheimer's disease in his mother; Diabetes in his father and mother; Heart attack in his maternal grandfather; Hyperlipidemia in his brother and mother; Hypertension in his brother, father, maternal grandmother, and mother; Prostate cancer in his paternal grandfather; Stroke in his maternal grandmother.   Current Medications:   Current Outpatient Medications (Endocrine & Metabolic):    FARXIGA 10 MG TABS tablet, Take 1 tablet (10 mg total) by mouth daily.   insulin NPH-regular Human (NOVOLIN 70/30) (70-30) 100 UNIT/ML injection, Novolin 70/30 U-100 Insulin 100 unit/mL subcutaneous suspension  INJECT 70 UNITS 30 MINUTES BEFORE BREAKFAST AND 70 UNITS BEFORE EVENING MEAL TWICE A DAY SUBCUTANEOUSLY   Current Outpatient Medications (Cardiovascular):    amLODipine (NORVASC) 5 MG tablet, TAKE ONE TABLET BY MOUTH DAILY   cholestyramine (QUESTRAN) 4 g packet, Take before dinner.   fenofibrate (TRICOR) 145 MG tablet, TAKE ONE TABLET BY MOUTH DAILY   losartan (COZAAR) 100 MG tablet, TAKE ONE TABLET BY MOUTH DAILY   rosuvastatin (CRESTOR) 20 MG tablet, Take 1 tablet (20 mg total) by mouth daily.   sildenafil (REVATIO) 20 MG tablet, Take 1 tablet (20 mg total) by mouth 3 (three) times daily.         Current Outpatient Medications (Other):    ACCU-CHEK FASTCLIX LANCETS MISC, use three times a day WHEN CHECKING BLOOD SUGAR   ACCU-CHEK SMARTVIEW test strip, 3 (three) times daily. for testing   B-D INS SYRINGE 0.5CC/31GX5/16 31G X 5/16" 0.5 ML MISC,    BD INSULIN  SYRINGE ULTRAFINE 31G X 15/64" 1 ML MISC,    Blood Glucose Monitoring Suppl (ACCU-CHEK GUIDE ME) w/Device KIT, use to check blood sugar   dicyclomine (BENTYL) 10 MG capsule, Take 1 capsule (10 mg total) by mouth 4 (four) times daily -  before meals and at bedtime. Use as needed for crampy abdominal pain   LOTEMAX SM 0.38 % GEL,    SYSTANE ULTRA 0.4-0.3 % SOLN, Apply 1 drop to eye 3 (three) times daily.   TYRVAYA 0.03 MG/ACT SOLN, Place 1 spray into the nose in the morning and at bedtime.   UNABLE TO FIND, Droplet Insulin Syringe 1 mL 31 gauge x 15/64"   cycloSPORINE (RESTASIS) 0.05 % ophthalmic emulsion, Place 1 drop into both eyes 2 (two) times daily. (Patient not taking: Reported on 02/22/2022)   diphenoxylate-atropine (LOMOTIL) 2.5-0.025 MG tablet, Take 1 tablet by mouth 4 (four) times daily as needed for diarrhea or loose stools. (Patient not taking: Reported on 02/22/2022)  Current Facility-Administered Medications (Other):    0.9 %  sodium chloride infusion  Surgical History:  He  has a past surgical history that includes Tonsillectomy and adenoidectomy (1953); Rotator cuff repair (Right, 2012); and eye surgeries (2021).  Current Medications, Allergies, Past Medical History, Past Surgical History, Family History and Social History were reviewed in [employer] record.  Physical Exam: BP 138/72   Pulse 63   Ht 5' 8"  (1.727 m)   Wt 204 lb (92.5 kg)   BMI 31.02 kg/m  General:   Pleasant, well developed male in no acute distress Heart : Regular rate and rhythm; no murmurs Pulm: Clear anteriorly; no wheezing Abdomen:  Soft, Obese AB, Active bowel sounds. No tenderness . Without guarding and Without rebound, No organomegaly appreciated. Rectal: Not evaluated Extremities:  without  edema. Neurologic:  Alert and  oriented x4;  No focal deficits.  Psych:  Cooperative. Normal mood and affect.   Vladimir Crofts, PA-C 02/22/22

## 2022-02-22 ENCOUNTER — Ambulatory Visit: Payer: Medicare HMO | Admitting: Physician Assistant

## 2022-02-22 ENCOUNTER — Encounter: Payer: Self-pay | Admitting: Physician Assistant

## 2022-02-22 VITALS — BP 138/72 | HR 63 | Ht 68.0 in | Wt 204.0 lb

## 2022-02-22 DIAGNOSIS — D126 Benign neoplasm of colon, unspecified: Secondary | ICD-10-CM | POA: Diagnosis not present

## 2022-02-22 DIAGNOSIS — Z794 Long term (current) use of insulin: Secondary | ICD-10-CM | POA: Diagnosis not present

## 2022-02-22 DIAGNOSIS — E1165 Type 2 diabetes mellitus with hyperglycemia: Secondary | ICD-10-CM | POA: Diagnosis not present

## 2022-02-22 DIAGNOSIS — R197 Diarrhea, unspecified: Secondary | ICD-10-CM

## 2022-02-22 DIAGNOSIS — K5730 Diverticulosis of large intestine without perforation or abscess without bleeding: Secondary | ICD-10-CM

## 2022-02-22 MED ORDER — PLENVU 140 G PO SOLR: 1.0000 | ORAL | 0 refills | Status: DC

## 2022-02-22 NOTE — Patient Instructions (Addendum)
_______________________________________________________  If you are age 75 or older, your body mass index should be between 23-30. Your Body mass index is 31.02 kg/m. If this is out of the aforementioned range listed, please consider follow up with your Primary Care Provider.  If you are age 87 or younger, your body mass index should be between 19-25. Your Body mass index is 31.02 kg/m. If this is out of the aformentioned range listed, please consider follow up with your Primary Care Provider.   ________________________________________________________  The [facility] providers would like to encourage you to use [facility] to communicate with providers for non-urgent requests or questions.  Due to long hold times on the telephone, sending your provider a message by [facility] may be a faster and more efficient way to get a response.  Please allow 48 business hours for a response.  Please remember that this is for non-urgent requests.  _______________________________________________________  [name] have been scheduled for a colonoscopy. Please follow written instructions given to you at your visit today.  Please pick up your prep supplies at the pharmacy within the next 1-3 days. If you use inhalers (even only as needed), please bring them with you on the day of your procedure.  We have sent the following medications to your pharmacy for you to pick up at your convenience: Plenvu    Diverticulosis Diverticulosis is a condition that develops when small pouches (diverticula) form in the wall of the large intestine (colon). The colon is where water is absorbed and stool (feces) is formed. The pouches form when the inside layer of the colon pushes through weak spots in the outer layers of the colon. You may have a few pouches or many of them. The pouches usually do not cause problems unless they become inflamed or infected. When this happens, the condition is called diverticulitis- this is left lower  quadrant pain, diarrhea, fever, chills, nausea or vomiting.  If this occurs please call the office or go to the hospital. Sometimes these patches without inflammation can also have painless bleeding associated with them, if this happens please call the office or go to the hospital. Preventing constipation and increasing fiber can help reduce diverticula and prevent complications. Even if you feel you have a high-fiber diet, suggest getting on Benefiber or Cirtracel 2 times daily.   You may have POST INFECTIOUS IBS OR IRRITABLE BOWEL After an infection your intestines can spasm or be a little bit more sensitive. Try these things below:  Can do BRAT diet versus low FODMAP- see below Try trial off milk/lactose products.  Add fiber like benefiber or citracel once a day Can do trial of IBGard for AB pain EVERY DAY- Take 1-2 capsules once a day for maintence or twice a day during a flare.  if any worsening symptoms like blood in stool, weight loss, please call the office or go to the ER.    FODMAP stands for fermentable oligo-, di-, mono-saccharides and polyols (1). These are the scientific terms used to classify groups of carbs that are notorious for triggering digestive symptoms like bloating, gas and stomach pain.     Marland Kitchen

## 2022-02-23 NOTE — Progress Notes (Addendum)
Called and spoke with patient.  I offered patient a sooner appt on 03/10/22. Pt was thankful for the offer, but he will not be available for a procedure that day. Pt plans to keep appt as scheduled for 03/16/22.

## 2022-02-23 NOTE — Progress Notes (Signed)
____________________________________________________________  Attending physician addendum:  Estill Bamberg, Thank you for sending this case to me. I have reviewed the entire note and agree with the plan.  He is currently on my [place] schedule for 03/16/22 We will offer a sooner colonoscopy appointment since one has just opened up.  Wilfrid Lund, MD  ____________________________________________________________  Herbert Seta,  The 4pm procedure slot on 03/10/22 is available.  Please ask this patient if he would like to move up the procedure.  If so, new prep timing instructions would be needed.  - HD

## 2022-02-26 ENCOUNTER — Telehealth: Payer: Self-pay | Admitting: Licensed Clinical Social Worker

## 2022-02-26 ENCOUNTER — Other Ambulatory Visit: Payer: Self-pay | Admitting: Family Medicine

## 2022-02-26 DIAGNOSIS — R197 Diarrhea, unspecified: Secondary | ICD-10-CM

## 2022-02-26 NOTE — Patient Outreach (Signed)
  Care Coordination   Initial Visit Note   02/26/2022 Name: [name] MRN: [id number] DOB: 07-20-1945  [name] is a 76 y.o. year old male who sees Rita Ohara, MD for primary care. I spoke with  Lina Sayre by phone today.  What matters to the patients health and wellness today?  Care Coordination    Goals Addressed             This Visit's Progress    COMPLETED: Care Coordination Activities-No Follow Up       Care Coordination Interventions: Active listening / Reflection utilized  LCSW informed patient of care coordination services. Pt is not interested at this time and agreed to contact PCP, should needs arise        SDOH assessments and interventions completed:  No     Care Coordination Interventions Activated:  Yes  Care Coordination Interventions:  Yes, provided   Follow up plan: No further intervention required.   Encounter Outcome:  Pt. Refused   Christa See, MSW, [email or web address] Phone [phone] 1:01 PM

## 2022-02-26 NOTE — Patient Instructions (Signed)
Visit Information  Thank you for taking time to visit with me today. Please don't hesitate to contact me if I can be of assistance to you.   Following are the goals we discussed today:   Goals Addressed             This Visit's Progress    COMPLETED: Care Coordination Activities-No Follow Up       Care Coordination Interventions: Active listening / Reflection utilized  LCSW informed patient of care coordination services. Pt is not interested at this time and agreed to contact PCP, should needs arise       If you are experiencing a Mental Health or [place] or need someone to talk to, please call the Suicide and Crisis Lifeline: 988 call 911   Patient verbalizes understanding of instructions and care plan provided today and agrees to view in [place]. Active MyChart status and patient understanding of how to access instructions and care plan via MyChart confirmed with patient.     No further follow up required:    Christa See, MSW, [email or web address] Phone [phone] 1:01 PM

## 2022-03-03 ENCOUNTER — Ambulatory Visit: Payer: Medicare HMO | Admitting: Gastroenterology

## 2022-03-10 ENCOUNTER — Ambulatory Visit: Payer: Medicare HMO | Admitting: Podiatry

## 2022-03-13 ENCOUNTER — Encounter: Payer: Self-pay | Admitting: Certified Registered Nurse Anesthetist

## 2022-03-14 ENCOUNTER — Other Ambulatory Visit: Payer: Self-pay | Admitting: Family Medicine

## 2022-03-14 DIAGNOSIS — I10 Essential (primary) hypertension: Secondary | ICD-10-CM

## 2022-03-15 ENCOUNTER — Telehealth: Payer: Self-pay | Admitting: Gastroenterology

## 2022-03-15 NOTE — Telephone Encounter (Signed)
Patient called states his sugar level is low but has procedure tomorrow. Requesting a call back as soon as possible. Please call to advise.

## 2022-03-15 NOTE — Telephone Encounter (Signed)
The pt states that he is diabetic and concerned that his blood sugar is going to drop today since he is on clear liquids for procedure tomorrow.  I have advised him to have full sugar liquids today and keep a check on his blood sugar levels.  He will call back if he has any further concerns.

## 2022-03-16 ENCOUNTER — Ambulatory Visit (AMBULATORY_SURGERY_CENTER): Payer: Medicare HMO | Admitting: Gastroenterology

## 2022-03-16 ENCOUNTER — Encounter: Payer: Self-pay | Admitting: Internal Medicine

## 2022-03-16 ENCOUNTER — Encounter: Payer: Self-pay | Admitting: Gastroenterology

## 2022-03-16 VITALS — BP 143/85 | HR 61 | Temp 98.6°F | Resp 23 | Ht 68.0 in | Wt 204.0 lb

## 2022-03-16 DIAGNOSIS — K5730 Diverticulosis of large intestine without perforation or abscess without bleeding: Secondary | ICD-10-CM | POA: Diagnosis not present

## 2022-03-16 DIAGNOSIS — R197 Diarrhea, unspecified: Secondary | ICD-10-CM | POA: Diagnosis not present

## 2022-03-16 DIAGNOSIS — K529 Noninfective gastroenteritis and colitis, unspecified: Secondary | ICD-10-CM | POA: Diagnosis not present

## 2022-03-16 DIAGNOSIS — I10 Essential (primary) hypertension: Secondary | ICD-10-CM | POA: Diagnosis not present

## 2022-03-16 DIAGNOSIS — E119 Type 2 diabetes mellitus without complications: Secondary | ICD-10-CM | POA: Diagnosis not present

## 2022-03-16 DIAGNOSIS — G4733 Obstructive sleep apnea (adult) (pediatric): Secondary | ICD-10-CM | POA: Diagnosis not present

## 2022-03-16 MED ORDER — SODIUM CHLORIDE 0.9 % IV SOLN: 500.0000 mL | Freq: Once | INTRAVENOUS | Status: DC

## 2022-03-16 NOTE — Progress Notes (Signed)
Report given to PACU, vss 

## 2022-03-16 NOTE — Patient Instructions (Signed)
Handout provided on diverticulosis.   Await pathology result. Continue present medications.   No future screening/surveillance colonoscopy recommended due to age, current guidelines and lack of polyps today.   YOU HAD AN ENDOSCOPIC PROCEDURE TODAY AT [facility]:   Refer to the procedure report that was given to you for any specific questions about what was found during the examination.  If the procedure report does not answer your questions, please call your gastroenterologist to clarify.  If you requested that your care partner not be given the details of your procedure findings, then the procedure report has been included in a sealed envelope for you to review at your convenience later.  YOU SHOULD EXPECT: Some feelings of bloating in the abdomen. Passage of more gas than usual.  Walking can help get rid of the air that was put into your GI tract during the procedure and reduce the bloating. If you had a lower endoscopy (such as a colonoscopy or flexible sigmoidoscopy) you may notice spotting of blood in your stool or on the toilet paper. If you underwent a bowel prep for your procedure, you may not have a normal bowel movement for a few days.  Please Note:  You might notice some irritation and congestion in your nose or some drainage.  This is from the oxygen used during your procedure.  There is no need for concern and it should clear up in a day or so.  SYMPTOMS TO REPORT IMMEDIATELY:  Following lower endoscopy (colonoscopy or flexible sigmoidoscopy):  Excessive amounts of blood in the stool  Significant tenderness or worsening of abdominal pains  Swelling of the abdomen that is new, acute  Fever of 100F or higher  For urgent or emergent issues, a gastroenterologist can be reached at any hour by calling [phone]. Do not use MyChart messaging for urgent concerns.    DIET:  We do recommend a small meal at first, but then you may proceed to your regular diet.  Drink  plenty of fluids but you should avoid alcoholic beverages for 24 hours.  ACTIVITY:  You should plan to take it easy for the rest of today and you should NOT DRIVE or use heavy machinery until tomorrow (because of the sedation medicines used during the test).    FOLLOW UP: Our staff will call the number listed on your records the next business day following your procedure.  We will call around 7:15- 8:00 am to check on you and address any questions or concerns that you may have regarding the information given to you following your procedure. If we do not reach you, we will leave a message.     If any biopsies were taken you will be contacted by phone or by letter within the next 1-3 weeks.  Please call us at [phone] if you have not heard about the biopsies in 3 weeks.    SIGNATURES/CONFIDENTIALITY: You and/or your care partner have signed paperwork which will be entered into your electronic medical record.  These signatures attest to the fact that that the information above on your After Visit Summary has been reviewed and is understood.  Full responsibility of the confidentiality of this discharge information lies with you and/or your care-partner.

## 2022-03-16 NOTE — Progress Notes (Signed)
Pt's states no medical or surgical changes since previsit or office visit. 

## 2022-03-16 NOTE — Progress Notes (Signed)
No changes to clinical history since GI office visit on 02/22/22.  Chronic diarrhea with negative GI pathogen panel.  The patient is appropriate for an endoscopic procedure in the ambulatory setting.  - Wilfrid Lund, MD

## 2022-03-16 NOTE — Op Note (Signed)
[facility] Patient Name: [name] Procedure Date: 03/16/2022 11:06 AM MRN: [id number] Endoscopist: Mallie Mussel L. Loletha Carrow , MD Age: 76 Referring MD:  Date of Birth: 1945/07/03 Gender: Male Account #: [id number] Procedure:                Colonoscopy Indications:              Chronic diarrhea                           acute onset, negative GI pathogen panel, lasted 4                            weeks before slowly subsiding and now reportedly                            all but resolved Medicines:                Monitored Anesthesia Care Procedure:                Pre-Anesthesia Assessment:                           - Prior to the procedure, a History and Physical                            was performed, and patient medications and                            allergies were reviewed. The patient's tolerance of                            previous anesthesia was also reviewed. The risks                            and benefits of the procedure and the sedation                            options and risks were discussed with the patient.                            All questions were answered, and informed consent                            was obtained. Prior Anticoagulants: The patient has                            taken no previous anticoagulant or antiplatelet                            agents. ASA Grade Assessment: II - A patient with                            mild systemic disease. After reviewing the risks  and benefits, the patient was deemed in                            satisfactory condition to undergo the procedure.                           After obtaining informed consent, the colonoscope                            was passed under direct vision. Throughout the                            procedure, the patient's blood pressure, pulse, and                            oxygen saturations were monitored continuously. The                             Olympus CF-HQ190L [id number]) Colonoscope was                            introduced through the anus and advanced to the the                            terminal ileum, with identification of the                            appendiceal orifice and IC valve. The colonoscopy                            was performed without difficulty. The patient                            tolerated the procedure well. The quality of the                            bowel preparation was excellent. The terminal                            ileum, ileocecal valve, appendiceal orifice, and                            rectum were photographed. Scope In: 11:21:43 AM Scope Out: 11:34:17 AM Scope Withdrawal Time: 0 hours 8 minutes 14 seconds  Total Procedure Duration: 0 hours 12 minutes 34 seconds  Findings:                 The perianal and digital rectal examinations were                            normal.                           The terminal ileum appeared normal.  Normal mucosa was found in the entire colon.                            Biopsies for histology were taken with a cold                            forceps from the right colon and left colon for                            evaluation of microscopic colitis.                           Multiple diverticula were found in the left colon.                           The exam was otherwise without abnormality on                            direct and retroflexion views. Complications:            No immediate complications. Estimated Blood Loss:     Estimated blood loss was minimal. Impression:               - The examined portion of the ileum was normal.                           - Normal mucosa in the entire examined colon.                            Biopsied.                           - Diverticulosis in the left colon.                           - The examination was otherwise normal on direct                            and retroflexion  views.                           If biopsies negative for microscopic colitis, the                            overall clinical scenario favors a post-infectious                            diarrhea that is now much improved. Recommendation:           - Patient has a contact number available for                            emergencies. The signs and symptoms of potential                            delayed complications were  discussed with the                            patient. Return to normal activities tomorrow.                            Written discharge instructions were provided to the                            patient.                           - Resume previous diet.                           - Continue present medications.                           - Await pathology results.                           - No future screening/surveillance colonoscopy                            recommended due to age, current guidelines and lack                            of polyps today. Dezaree Tracey L. Loletha Carrow, MD 03/16/2022 11:39:24 AM This report has been signed electronically.

## 2022-03-16 NOTE — Progress Notes (Signed)
Called to room to assist during endoscopic procedure.  Patient ID and intended procedure confirmed with present staff. Received instructions for my participation in the procedure from the performing physician.  

## 2022-03-17 ENCOUNTER — Telehealth: Payer: Self-pay | Admitting: *Deleted

## 2022-03-17 DIAGNOSIS — H02889 Meibomian gland dysfunction of unspecified eye, unspecified eyelid: Secondary | ICD-10-CM | POA: Diagnosis not present

## 2022-03-17 DIAGNOSIS — H04123 Dry eye syndrome of bilateral lacrimal glands: Secondary | ICD-10-CM | POA: Diagnosis not present

## 2022-03-17 NOTE — Telephone Encounter (Signed)
  Follow up Call-     03/16/2022   10:28 AM  Call back number  Post procedure Call Back phone  # [phone]  Permission to leave phone message Yes     Patient questions:  Do you have a fever, pain , or abdominal swelling? No. Pain Score  0 *  Have you tolerated food without any problems? Yes.    Have you been able to return to your normal activities? Yes.    Do you have any questions about your discharge instructions: Diet   No. Medications  No. Follow up visit  No.  Do you have questions or concerns about your Care? No.  Actions: * If pain score is 4 or above: No action needed, pain <4.

## 2022-03-22 ENCOUNTER — Encounter: Payer: Self-pay | Admitting: Gastroenterology

## 2022-03-23 ENCOUNTER — Encounter: Payer: Self-pay | Admitting: Gastroenterology

## 2022-03-24 DIAGNOSIS — G4733 Obstructive sleep apnea (adult) (pediatric): Secondary | ICD-10-CM | POA: Diagnosis not present

## 2022-04-02 ENCOUNTER — Encounter: Payer: Self-pay | Admitting: Podiatry

## 2022-04-02 ENCOUNTER — Ambulatory Visit (INDEPENDENT_AMBULATORY_CARE_PROVIDER_SITE_OTHER): Payer: Medicare HMO | Admitting: Podiatry

## 2022-04-02 DIAGNOSIS — M79674 Pain in right toe(s): Secondary | ICD-10-CM

## 2022-04-02 DIAGNOSIS — Z794 Long term (current) use of insulin: Secondary | ICD-10-CM | POA: Diagnosis not present

## 2022-04-02 DIAGNOSIS — B351 Tinea unguium: Secondary | ICD-10-CM | POA: Diagnosis not present

## 2022-04-02 DIAGNOSIS — M79675 Pain in left toe(s): Secondary | ICD-10-CM

## 2022-04-02 DIAGNOSIS — E1165 Type 2 diabetes mellitus with hyperglycemia: Secondary | ICD-10-CM

## 2022-04-02 DIAGNOSIS — N1831 Chronic kidney disease, stage 3a: Secondary | ICD-10-CM

## 2022-04-02 NOTE — Progress Notes (Signed)
This patient returns to my office for at risk foot care.  This patient requires this care by a professional since this patient will be at risk due to having type 2 diabetes.  This patient is unable to cut nails himself since the patient cannot reach his nails.These nails are painful walking and wearing shoes.  This patient presents for at risk foot care today.  General Appearance  Alert, conversant and in no acute stress.  Vascular  Dorsalis pedis and posterior tibial  pulses are palpable  bilaterally.  Capillary return is within normal limits  bilaterally. Temperature is within normal limits  bilaterally.  Neurologic  Senn-Weinstein monofilament wire test within normal limits  bilaterally. Muscle power within normal limits bilaterally.  Nails Thick disfigured discolored nails with subungual debris  from hallux to fifth toes bilaterally. No evidence of bacterial infection or drainage bilaterally.  Orthopedic  No limitations of motion  feet .  No crepitus or effusions noted.  No bony pathology or digital deformities noted.  Skin  normotropic skin with no porokeratosis noted bilaterally.  No signs of infections or ulcers noted.     Onychomycosis  Pain in right toes  Pain in left toes  Consent was obtained for treatment procedures.   Mechanical debridement of nails 1-5  bilaterally performed with a nail nipper.  Filed with dremel without incident.    Return office visit    9 weeks                  Told patient to return for periodic foot care and evaluation due to potential at risk complications.   Gardiner Barefoot DPM

## 2022-04-14 NOTE — Progress Notes (Unsigned)
No chief complaint on file.  Patient was last seen in September with chronic diarrhea.  He ultimately had normal colonoscopy. UPDATE   L neck mass was reported/noted at his physical in May. 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 2.3 cm x 1.4 cm x 3.1 cm fat density lesion in the left posterior neck has imaging features in keeping with a lipoma, and is not substantially changed in size compared to the ultrasound from 2019, allowing for differences in modality. 2. Smaller fat density lesion measuring 1.3 cm overlying the superficial aspect of the left parotid gland corresponding to the palpable marker, also most in keeping with a small lipoma. 3. No suspicious mass lesion or pathologic lymphadenopathy in the neck.   Hypertension follow-up: He is taking amlodipine and Losartan.  He denies any side effects. He denies headaches, dizziness, chest pain, palpitations, edema or shortness of breath. BP's are running   BP Readings from Last 3 Encounters:  03/16/22 (!) 143/85  02/22/22 138/72  02/10/22 136/70       Hyperlipidemia follow-up: Patient is reportedly following a low-fat, low cholesterol diet. Compliant with taking his Crestor,Tricor, and 3 fish oil daily (for his dry eyes), and denies medication side effects.  LDL was at goal on last check, with low HDL and slightly elevated TG. He eats red meat once a week, limits fried foods.  Lab Results  Component Value Date   CHOL 153 10/08/2021   HDL 33 (L) 10/08/2021   LDLCALC 89 10/08/2021   TRIG 176 (H) 10/08/2021   CHOLHDL 4.6 10/08/2021    DM: managed by Dr. Buddy Duty.  He was last seen in June 2023, no notes received. UPDATE Last notes were from December, 2022, A1c was 8.0%. Urine microalbumin was also done then and was normal .   Currently he is taking 70/30, 80 U at night, 40 U in the morning and Farxiga 82m. Sugars are high in the morning, 180-200, running 100's in the evenings. He has had some  hypoglycemia, 40's-50, in the afternoon (while playing golf), only 2x in the last 3 months. He has glucose tablets with him. Denies polydipsia, polyuria.  Gets routine care at podiatrist. He denies any neuropathy or lesions. Last diabetic eye exam was 12/2021, no retinopathy. He sees Dr. SLucita Ferrararegularly, who has been treating his dry eyes.     OSA: Uses CPAP machine nightly. He feels much better when using it. Denies any problems, tolerating it well. Feels refreshed when he wakes up. He continues to get benefit from CPAP use.   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 He has noted something at his R jaw, below his ear, a lump, noted when shaving.  He thinks it has been there 6-7 months.   Obesity:  He previously reported eating pretty healthy for breakfast and lunch, but had hors d'heuvres (2 handfuls of unsalted cashews, or cheese and crackers), followed by a heavier dinner.  He is eating a smaller dinner now.  He limits portions of rice/carbs. Occasional small sugar-free cookie, infrequent desserts. He has 1 drink a night, 2 on the weekends.  Occ beer after golfing in the summer.     PMH, PSH, SH reviewed   ROS: no fever, chills, URI symptoms. No change to mass at posterior neck or below jaw on L. No nausea, vomiting, diarrhea, abdominal pain or urinary complaints. No headaches, dizziness, chest pain, shortness of breath or  edema. No skin rashes, bleeding/bruising. Moods are good    PHYSICAL EXAM:  There were no vitals taken for this visit.  Wt Readings from Last 3 Encounters:  03/16/22 204 lb (92.5 kg)  02/22/22 204 lb (92.5 kg)  02/10/22 207 lb 3.2 oz (94 kg)    Well developed, pleasant, overweight male in no distress, in good spirits HEENT: PERRL, EOMI, conjunctiva and sclera are clear. Neck: no lymphadenopathy, thyromegaly or carotid bruit. Soft tissue mass L posterior neck unchanged, 3x3cm. L lower jaw, below L ear, 1.5  x 1.5 cm mass, nontender. Heart: regular rate and rhythm Lungs: clear bilaterally Back: no CVA tenderness, SI or spinal tenderness Abdomen: soft, nontender, no organomegaly or mass Extremities: no edema, normal pulses.  Psych: normal mood, affect, hygiene and grooming Neuro: alert and oriented, normal gait Skin: no visible rashes, normal turgor  UPDATE jaw masses 888     ASSESSMENT/PLAN:  Records from Dr. Buddy Duty?? Last seen in 11/2021, no records received. Labs done??  Flu shot COVID booster (decline if refuses) TdaP due from pharmacy RSV from pharmacy  RF losartan No labs  Schedule f/u CPE/AWV for 6 mos ??fasting labs prior?  If so, need to wait and see what Buddy Duty has done.  Suspect CBC, c-met, lipid, TSH ?urine microalb No PSA due to age

## 2022-04-14 NOTE — Patient Instructions (Incomplete)
I recommend getting the RSV vaccine from the pharmacy. This should be separated from other vaccines by 2 weeks.  You are also due for a tetanus booster (TdaP).  You should get this from the pharmacy (2 weeks apart from the RSV).

## 2022-04-15 ENCOUNTER — Encounter: Payer: Self-pay | Admitting: Family Medicine

## 2022-04-15 ENCOUNTER — Ambulatory Visit (INDEPENDENT_AMBULATORY_CARE_PROVIDER_SITE_OTHER): Payer: Medicare HMO | Admitting: Family Medicine

## 2022-04-15 VITALS — BP 132/86 | HR 76 | Ht 68.0 in | Wt 203.8 lb

## 2022-04-15 DIAGNOSIS — E785 Hyperlipidemia, unspecified: Secondary | ICD-10-CM

## 2022-04-15 DIAGNOSIS — E1169 Type 2 diabetes mellitus with other specified complication: Secondary | ICD-10-CM | POA: Diagnosis not present

## 2022-04-15 DIAGNOSIS — L570 Actinic keratosis: Secondary | ICD-10-CM

## 2022-04-15 DIAGNOSIS — I10 Essential (primary) hypertension: Secondary | ICD-10-CM

## 2022-04-15 DIAGNOSIS — I152 Hypertension secondary to endocrine disorders: Secondary | ICD-10-CM | POA: Diagnosis not present

## 2022-04-15 DIAGNOSIS — E782 Mixed hyperlipidemia: Secondary | ICD-10-CM

## 2022-04-15 DIAGNOSIS — E1159 Type 2 diabetes mellitus with other circulatory complications: Secondary | ICD-10-CM | POA: Diagnosis not present

## 2022-04-15 DIAGNOSIS — Z794 Long term (current) use of insulin: Secondary | ICD-10-CM

## 2022-04-15 DIAGNOSIS — E1165 Type 2 diabetes mellitus with hyperglycemia: Secondary | ICD-10-CM

## 2022-04-15 DIAGNOSIS — G4733 Obstructive sleep apnea (adult) (pediatric): Secondary | ICD-10-CM | POA: Diagnosis not present

## 2022-04-15 MED ORDER — LOSARTAN POTASSIUM 100 MG PO TABS: ORAL_TABLET | ORAL | 1 refills | Status: DC

## 2022-04-16 ENCOUNTER — Telehealth: Payer: Self-pay | Admitting: Family Medicine

## 2022-04-16 NOTE — Telephone Encounter (Signed)
Faxed to number listed for new order for CPAP

## 2022-04-16 NOTE — Telephone Encounter (Signed)
Pt called and stated when he seen you yesterday yall discussed how he loves his cpap but then he got home and it messed up and gave a message that it has exceeded meter life.  He is asking for you to order him a new one from [facility], [facility] [employer]. Provided fax number for them [phone].

## 2022-04-20 ENCOUNTER — Encounter: Payer: Self-pay | Admitting: *Deleted

## 2022-04-28 ENCOUNTER — Telehealth: Payer: Self-pay | Admitting: Family Medicine

## 2022-04-28 NOTE — Telephone Encounter (Signed)
Sent fax

## 2022-04-28 NOTE — Telephone Encounter (Signed)
[name] called and asks if we can fax over the pressure levels he needs for his cpap machine. Fax number provided [phone].

## 2022-05-05 DIAGNOSIS — G4733 Obstructive sleep apnea (adult) (pediatric): Secondary | ICD-10-CM | POA: Diagnosis not present

## 2022-05-21 DIAGNOSIS — Z794 Long term (current) use of insulin: Secondary | ICD-10-CM | POA: Diagnosis not present

## 2022-05-21 DIAGNOSIS — E1122 Type 2 diabetes mellitus with diabetic chronic kidney disease: Secondary | ICD-10-CM | POA: Diagnosis not present

## 2022-05-21 DIAGNOSIS — N1831 Chronic kidney disease, stage 3a: Secondary | ICD-10-CM | POA: Diagnosis not present

## 2022-05-21 DIAGNOSIS — E669 Obesity, unspecified: Secondary | ICD-10-CM | POA: Diagnosis not present

## 2022-06-04 DIAGNOSIS — G4733 Obstructive sleep apnea (adult) (pediatric): Secondary | ICD-10-CM | POA: Diagnosis not present

## 2022-06-11 ENCOUNTER — Ambulatory Visit (INDEPENDENT_AMBULATORY_CARE_PROVIDER_SITE_OTHER): Payer: Medicare HMO | Admitting: Podiatry

## 2022-06-11 ENCOUNTER — Encounter: Payer: Self-pay | Admitting: Podiatry

## 2022-06-11 DIAGNOSIS — B351 Tinea unguium: Secondary | ICD-10-CM | POA: Diagnosis not present

## 2022-06-11 DIAGNOSIS — Z794 Long term (current) use of insulin: Secondary | ICD-10-CM

## 2022-06-11 DIAGNOSIS — E1165 Type 2 diabetes mellitus with hyperglycemia: Secondary | ICD-10-CM

## 2022-06-11 DIAGNOSIS — M79675 Pain in left toe(s): Secondary | ICD-10-CM | POA: Diagnosis not present

## 2022-06-11 DIAGNOSIS — N1831 Chronic kidney disease, stage 3a: Secondary | ICD-10-CM

## 2022-06-11 DIAGNOSIS — M79674 Pain in right toe(s): Secondary | ICD-10-CM | POA: Diagnosis not present

## 2022-06-11 NOTE — Progress Notes (Signed)
This patient returns to my office for at risk foot care.  This patient requires this care by a professional since this patient will be at risk due to having type 2 diabetes.  This patient is unable to cut nails himself since the patient cannot reach his nails.These nails are painful walking and wearing shoes.  This patient presents for at risk foot care today.  General Appearance  Alert, conversant and in no acute stress.  Vascular  Dorsalis pedis and posterior tibial  pulses are palpable  bilaterally.  Capillary return is within normal limits  bilaterally. Temperature is within normal limits  bilaterally.  Neurologic  Senn-Weinstein monofilament wire test within normal limits  bilaterally. Muscle power within normal limits bilaterally.  Nails Thick disfigured discolored nails with subungual debris  from hallux to fifth toes bilaterally. No evidence of bacterial infection or drainage bilaterally.  Orthopedic  No limitations of motion  feet .  No crepitus or effusions noted.  No bony pathology or digital deformities noted.  Skin  normotropic skin with no porokeratosis noted bilaterally.  No signs of infections or ulcers noted.     Onychomycosis  Pain in right toes  Pain in left toes  Consent was obtained for treatment procedures.   Mechanical debridement of nails 1-5  bilaterally performed with a nail nipper.  Filed with dremel without incident.    Return office visit    9 weeks                  Told patient to return for periodic foot care and evaluation due to potential at risk complications.   Gardiner Barefoot DPM

## 2022-06-13 ENCOUNTER — Other Ambulatory Visit: Payer: Self-pay | Admitting: Family Medicine

## 2022-06-13 DIAGNOSIS — I10 Essential (primary) hypertension: Secondary | ICD-10-CM

## 2022-06-23 DIAGNOSIS — G4733 Obstructive sleep apnea (adult) (pediatric): Secondary | ICD-10-CM | POA: Diagnosis not present

## 2022-07-05 DIAGNOSIS — G4733 Obstructive sleep apnea (adult) (pediatric): Secondary | ICD-10-CM | POA: Diagnosis not present

## 2022-07-15 DIAGNOSIS — D485 Neoplasm of uncertain behavior of skin: Secondary | ICD-10-CM | POA: Diagnosis not present

## 2022-07-15 DIAGNOSIS — H02535 Eyelid retraction left lower eyelid: Secondary | ICD-10-CM | POA: Diagnosis not present

## 2022-07-15 DIAGNOSIS — H02532 Eyelid retraction right lower eyelid: Secondary | ICD-10-CM | POA: Diagnosis not present

## 2022-07-15 DIAGNOSIS — H04123 Dry eye syndrome of bilateral lacrimal glands: Secondary | ICD-10-CM | POA: Diagnosis not present

## 2022-07-15 DIAGNOSIS — H0279 Other degenerative disorders of eyelid and periocular area: Secondary | ICD-10-CM | POA: Diagnosis not present

## 2022-07-15 DIAGNOSIS — H16213 Exposure keratoconjunctivitis, bilateral: Secondary | ICD-10-CM | POA: Diagnosis not present

## 2022-07-22 ENCOUNTER — Other Ambulatory Visit: Payer: Self-pay | Admitting: *Deleted

## 2022-07-22 DIAGNOSIS — Z794 Long term (current) use of insulin: Secondary | ICD-10-CM

## 2022-07-22 DIAGNOSIS — E1159 Type 2 diabetes mellitus with other circulatory complications: Secondary | ICD-10-CM

## 2022-07-29 ENCOUNTER — Encounter: Payer: Self-pay | Admitting: Podiatry

## 2022-08-05 DIAGNOSIS — G4733 Obstructive sleep apnea (adult) (pediatric): Secondary | ICD-10-CM | POA: Diagnosis not present

## 2022-08-20 ENCOUNTER — Ambulatory Visit: Payer: Medicare HMO | Admitting: Podiatry

## 2022-08-23 ENCOUNTER — Encounter: Payer: Self-pay | Admitting: Family Medicine

## 2022-08-23 ENCOUNTER — Ambulatory Visit (INDEPENDENT_AMBULATORY_CARE_PROVIDER_SITE_OTHER): Payer: Medicare HMO | Admitting: Podiatry

## 2022-08-23 ENCOUNTER — Encounter: Payer: Self-pay | Admitting: Podiatry

## 2022-08-23 ENCOUNTER — Telehealth (INDEPENDENT_AMBULATORY_CARE_PROVIDER_SITE_OTHER): Payer: Medicare HMO | Admitting: Family Medicine

## 2022-08-23 VITALS — Temp 97.0°F | Ht 68.0 in | Wt 203.0 lb

## 2022-08-23 DIAGNOSIS — E1165 Type 2 diabetes mellitus with hyperglycemia: Secondary | ICD-10-CM

## 2022-08-23 DIAGNOSIS — M79674 Pain in right toe(s): Secondary | ICD-10-CM

## 2022-08-23 DIAGNOSIS — U071 COVID-19: Secondary | ICD-10-CM | POA: Diagnosis not present

## 2022-08-23 DIAGNOSIS — Z794 Long term (current) use of insulin: Secondary | ICD-10-CM

## 2022-08-23 DIAGNOSIS — M79675 Pain in left toe(s): Secondary | ICD-10-CM

## 2022-08-23 DIAGNOSIS — B351 Tinea unguium: Secondary | ICD-10-CM

## 2022-08-23 MED ORDER — NIRMATRELVIR/RITONAVIR (PAXLOVID) TABLET (RENAL DOSING): 2.0000 | ORAL_TABLET | Freq: Two times a day (BID) | ORAL | 0 refills | Status: AC

## 2022-08-23 NOTE — Progress Notes (Signed)
This patient returns to my office for at risk foot care.  This patient requires this care by a professional since this patient will be at risk due to having type 2 diabetes.  This patient is unable to cut nails himself since the patient cannot reach his nails.These nails are painful walking and wearing shoes.  This patient presents for at risk foot care today.  General Appearance  Alert, conversant and in no acute stress.  Vascular  Dorsalis pedis and posterior tibial  pulses are palpable  bilaterally.  Capillary return is within normal limits  bilaterally. Temperature is within normal limits  bilaterally.  Neurologic  Senn-Weinstein monofilament wire test within normal limits  bilaterally. Muscle power within normal limits bilaterally.  Nails Thick disfigured discolored nails with subungual debris  from hallux to fifth toes bilaterally. No evidence of bacterial infection or drainage bilaterally.  Orthopedic  No limitations of motion  feet .  No crepitus or effusions noted.  No bony pathology or digital deformities noted.  Skin  normotropic skin with no porokeratosis noted bilaterally.  No signs of infections or ulcers noted.     Onychomycosis  Pain in right toes  Pain in left toes  Consent was obtained for treatment procedures.   Mechanical debridement of nails 1-5  bilaterally performed with a nail nipper.  Filed with dremel without incident.    Return office visit    9 weeks                  Told patient to return for periodic foot care and evaluation due to potential at risk complications.   Alfredo Spong DPM  

## 2022-08-23 NOTE — Patient Instructions (Signed)
Stay well hydrated. Use Robitussin DM more frequently (as directed on the bottle--beware of increased sugars from the liquid) vs switching to a tablet of guaifenesin and dextromethorphan such as Mucinex DM).  Take the paxlovid twice daily for 5 days.  You must stop the rosuvastatin for the 5 days that you take the paxlovid--since you already took it today, you should start the paxlovid tomorrow.

## 2022-08-23 NOTE — Progress Notes (Signed)
Start time: 11:03 End time: 11:22  Virtual Visit via Video Note  I connected with [name] on 08/23/22 by a video enabled telemedicine application and verified that I am speaking with the correct person using two identifiers.  Location: Patient: home Provider: office   I discussed the limitations of evaluation and management by telemedicine and the availability of in person appointments. The patient expressed understanding and agreed to proceed.  History of Present Illness:  Chief Complaint  Patient presents with   Covid Positive    VIRTUAL covid positive Sunday, symptoms started Sunday. Lots of chest/head congestion. Low grade fever. Took Paxlovid last time and worked well.    3/16 he started with congestion, thought it was just allergies after he had a negative COVID test. He had increased congestion in his head and chest that night, and yesterday morning he tested + for COVID.   He had a low grade fever of 99.2, no body aches, just sore from coughing. Not getting up much nasal drainage, mainly congested.  Using Robitussin DM and Nyquil, as recommended by Dr. Redmond School yesterday (spoke to him, on call). Took Robitussin just once in the morning, and nyquil at night.  No known sick contacts.   Observations/Objective:  Temp (!) 97 F (36.1 C) (Oral)   Ht 5\' 8"  (1.727 m)   Wt 203 lb (92.1 kg)   BMI 30.87 kg/m   Well-appearing, pleasant male. He is alert and oriented. No coughing, sniffling or throat-clearing during visit. Exam is limited due to the virtual nature of the visit.  Assessment and Plan:  COVID-19 virus infection - supportive measures reviewed. Treat with Paxlovid (lower dose due to GFR sometimes <60); to start tomorrow, d/t taking crestor today already - Plan: nirmatrelvir/ritonavir, renal dosing, (PAXLOVID) 10 x 150 MG & 10 x 100MG  TABS  GFR reviewed--61 in Sept, had been 86 in August, and May Will prescribe paxlovid with renal dosing to be safe, no  recent GFR available  Declined benzonatate.   Follow Up Instructions:    I discussed the assessment and treatment plan with the patient. The patient was provided an opportunity to ask questions and all were answered. The patient agreed with the plan and demonstrated an understanding of the instructions.   The patient was advised to call back or seek an in-person evaluation if the symptoms worsen or if the condition fails to improve as anticipated.  I spent 21 minutes dedicated to the care of this patient, including pre-visit review of records, face to face time, post-visit ordering of testing and documentation.    Vikki Ports, MD

## 2022-08-26 DIAGNOSIS — L821 Other seborrheic keratosis: Secondary | ICD-10-CM | POA: Diagnosis not present

## 2022-08-26 DIAGNOSIS — L738 Other specified follicular disorders: Secondary | ICD-10-CM | POA: Diagnosis not present

## 2022-08-26 DIAGNOSIS — L810 Postinflammatory hyperpigmentation: Secondary | ICD-10-CM | POA: Diagnosis not present

## 2022-08-31 DIAGNOSIS — N1831 Chronic kidney disease, stage 3a: Secondary | ICD-10-CM | POA: Diagnosis not present

## 2022-08-31 DIAGNOSIS — E669 Obesity, unspecified: Secondary | ICD-10-CM | POA: Diagnosis not present

## 2022-08-31 DIAGNOSIS — Z794 Long term (current) use of insulin: Secondary | ICD-10-CM | POA: Diagnosis not present

## 2022-08-31 DIAGNOSIS — E1165 Type 2 diabetes mellitus with hyperglycemia: Secondary | ICD-10-CM | POA: Diagnosis not present

## 2022-08-31 DIAGNOSIS — E1122 Type 2 diabetes mellitus with diabetic chronic kidney disease: Secondary | ICD-10-CM | POA: Diagnosis not present

## 2022-09-03 DIAGNOSIS — G4733 Obstructive sleep apnea (adult) (pediatric): Secondary | ICD-10-CM | POA: Diagnosis not present

## 2022-09-15 ENCOUNTER — Other Ambulatory Visit: Payer: Self-pay | Admitting: Family Medicine

## 2022-09-15 DIAGNOSIS — E782 Mixed hyperlipidemia: Secondary | ICD-10-CM

## 2022-09-21 DIAGNOSIS — G4733 Obstructive sleep apnea (adult) (pediatric): Secondary | ICD-10-CM | POA: Diagnosis not present

## 2022-10-04 ENCOUNTER — Other Ambulatory Visit: Payer: Self-pay | Admitting: Family Medicine

## 2022-10-04 DIAGNOSIS — E782 Mixed hyperlipidemia: Secondary | ICD-10-CM

## 2022-10-04 DIAGNOSIS — G4733 Obstructive sleep apnea (adult) (pediatric): Secondary | ICD-10-CM | POA: Diagnosis not present

## 2022-10-05 DIAGNOSIS — Z794 Long term (current) use of insulin: Secondary | ICD-10-CM | POA: Diagnosis not present

## 2022-10-05 DIAGNOSIS — B353 Tinea pedis: Secondary | ICD-10-CM | POA: Diagnosis not present

## 2022-10-05 DIAGNOSIS — N1831 Chronic kidney disease, stage 3a: Secondary | ICD-10-CM | POA: Diagnosis not present

## 2022-10-05 DIAGNOSIS — E1122 Type 2 diabetes mellitus with diabetic chronic kidney disease: Secondary | ICD-10-CM | POA: Diagnosis not present

## 2022-10-05 LAB — MICROALBUMIN / CREATININE URINE RATIO: Microalb Creat Ratio: 17.4

## 2022-10-05 LAB — HEMOGLOBIN A1C: Hemoglobin A1C: 6.6

## 2022-10-05 LAB — LIPID PANEL
Cholesterol: 118 (ref 0–200)
HDL: 40 (ref 35–70)
LDL Cholesterol: 60
LDl/HDL Ratio: 3
Triglycerides: 94 (ref 40–160)

## 2022-10-05 LAB — HM DIABETES FOOT EXAM: HM Diabetic Foot Exam: ABNORMAL

## 2022-10-12 ENCOUNTER — Encounter: Payer: Self-pay | Admitting: *Deleted

## 2022-10-15 ENCOUNTER — Other Ambulatory Visit: Payer: Self-pay | Admitting: Family Medicine

## 2022-10-15 DIAGNOSIS — I10 Essential (primary) hypertension: Secondary | ICD-10-CM

## 2022-10-15 DIAGNOSIS — Z794 Long term (current) use of insulin: Secondary | ICD-10-CM

## 2022-10-15 NOTE — Telephone Encounter (Signed)
Pt has appt in June.

## 2022-10-26 DIAGNOSIS — H6123 Impacted cerumen, bilateral: Secondary | ICD-10-CM | POA: Diagnosis not present

## 2022-11-03 DIAGNOSIS — G4733 Obstructive sleep apnea (adult) (pediatric): Secondary | ICD-10-CM | POA: Diagnosis not present

## 2022-11-08 ENCOUNTER — Ambulatory Visit (INDEPENDENT_AMBULATORY_CARE_PROVIDER_SITE_OTHER): Payer: Medicare HMO | Admitting: Podiatry

## 2022-11-08 ENCOUNTER — Encounter: Payer: Self-pay | Admitting: Podiatry

## 2022-11-08 DIAGNOSIS — B351 Tinea unguium: Secondary | ICD-10-CM

## 2022-11-08 DIAGNOSIS — M79674 Pain in right toe(s): Secondary | ICD-10-CM | POA: Diagnosis not present

## 2022-11-08 DIAGNOSIS — M79675 Pain in left toe(s): Secondary | ICD-10-CM | POA: Diagnosis not present

## 2022-11-08 DIAGNOSIS — E1165 Type 2 diabetes mellitus with hyperglycemia: Secondary | ICD-10-CM

## 2022-11-08 DIAGNOSIS — Z794 Long term (current) use of insulin: Secondary | ICD-10-CM

## 2022-11-08 NOTE — Progress Notes (Signed)
This patient returns to my office for at risk foot care.  This patient requires this care by a professional since this patient will be at risk due to having type 2 diabetes.  This patient is unable to cut nails himself since the patient cannot reach his nails.These nails are painful walking and wearing shoes.  This patient presents for at risk foot care today.  General Appearance  Alert, conversant and in no acute stress.  Vascular  Dorsalis pedis and posterior tibial  pulses are palpable  bilaterally.  Capillary return is within normal limits  bilaterally. Temperature is within normal limits  bilaterally.  Neurologic  Senn-Weinstein monofilament wire test within normal limits  bilaterally. Muscle power within normal limits bilaterally.  Nails Thick disfigured discolored nails with subungual debris  from hallux to fifth toes bilaterally. No evidence of bacterial infection or drainage bilaterally.  Orthopedic  No limitations of motion  feet .  No crepitus or effusions noted.  No bony pathology or digital deformities noted.  Skin  normotropic skin with no porokeratosis noted bilaterally.  No signs of infections or ulcers noted.     Onychomycosis  Pain in right toes  Pain in left toes  Consent was obtained for treatment procedures.   Mechanical debridement of nails 1-5  bilaterally performed with a nail nipper.  Filed with dremel without incident.    Return office visit    9 weeks                  Told patient to return for periodic foot care and evaluation due to potential at risk complications.   Kahmari Koller DPM  

## 2022-11-14 NOTE — Patient Instructions (Signed)
  HEALTH MAINTENANCE RECOMMENDATIONS:  It is recommended that you get at least 30 minutes of aerobic exercise at least 5 days/week (for weight loss, you may need as much as 60-90 minutes). This can be any activity that gets your heart rate up. This can be divided in 10-15 minute intervals if needed, but try and build up your endurance at least once a week.  Weight bearing exercise is also recommended twice weekly.  Eat a healthy diet with lots of vegetables, fruits and fiber.  "Colorful" foods have a lot of vitamins (ie green vegetables, tomatoes, red peppers, etc).  Limit sweet tea, regular sodas and alcoholic beverages, all of which has a lot of calories and sugar.  Up to 2 alcoholic drinks daily may be beneficial for men (unless trying to lose weight, watch sugars).  Drink a lot of water.  Sunscreen of at least SPF 30 should be used on all sun-exposed parts of the skin when outside between the hours of 10 am and 4 pm (not just when at beach or pool, but even with exercise, golf, tennis, and yard work!)  Use a sunscreen that says "broad spectrum" so it covers both UVA and UVB rays, and make sure to reapply every 1-2 hours.  Remember to change the batteries in your smoke detectors when changing your clock times in the spring and fall.  Carbon monoxide detectors are recommended for your home.  Use your seat belt every time you are in a car, and please drive safely and not be distracted with cell phones and texting while driving.    [name] , Thank you for taking time to come for your Medicare Wellness Visit. I appreciate your ongoing commitment to your health goals. Please review the following plan we discussed and let me know if I can assist you in the future.   This is a list of the screening recommended for you and due dates:  Health Maintenance  Topic Date Due   Zoster (Shingles) Vaccine (1 of 2) Never done   COVID-19 Vaccine (4 - 2023-24 season) 02/05/2022   DTaP/Tdap/Td vaccine (4 -  Td or Tdap) 03/01/2022   Complete foot exam   10/10/2022   Eye exam for diabetics  12/30/2022   Flu Shot  01/06/2023   Yearly kidney function blood test for diabetes  02/11/2023   Hemoglobin A1C  04/06/2023   Yearly kidney health urinalysis for diabetes  10/05/2023   Medicare Annual Wellness Visit  11/15/2023   Pneumonia Vaccine  Completed   Hepatitis C Screening  Completed   HPV Vaccine  Aged Out   Colon Cancer Screening  Discontinued    Please bring [place] copies of your Living Will and Healthcare Power of Attorney once completed and notarized so that it can be scanned into your medical chart.  Please get tetanus booster (TdaP) from the pharmacy. I recommend getting the new shingles vaccine (Shingrix). Since you have Medicare, you will need to get this from the pharmacy, as it is covered by Part D. This is a series of 2 injections, spaced 2 months apart.   This should be separated from other vaccines by at least 2 weeks.  I recommend getting RSV vaccine from the pharmacy in the Fall. You can get this the same day as your flu shot, or separate by 2 weeks.  Consider getting a COVID booster soon.

## 2022-11-14 NOTE — Progress Notes (Unsigned)
No chief complaint on file.   [name] is a 77 y.o. male who presents for annual wellness visit and follow-up on chronic medical conditions.    Hypertension follow-up: He is taking amlodipine and Losartan.  He denies any side effects. He denies headaches, dizziness, chest pain, palpitations, edema or shortness of breath.    BP Readings from Last 3 Encounters:  04/15/22 132/86  03/16/22 (!) 143/85  02/22/22 138/72     Hyperlipidemia follow-up: Patient is reportedly following a low-fat, low cholesterol diet. Compliant with taking his Crestor,Tricor, and 3 fish oil daily (for his dry eyes), and denies medication side effects.  He eats red meat once a week, limits fried foods. Lipids were at goal on last check.   Lab Results  Component Value Date   CHOL 118 10/05/2022   HDL 40 10/05/2022   LDLCALC 60 10/05/2022   TRIG 94 10/05/2022   CHOLHDL 4.6 10/08/2021     DM: managed by Dr. Sharl Ma.  Last A1c was 6.6% in April.  Sugars are Denies hypoglycemia. Denies polydipsia, polyuria.  Gets routine care at podiatrist. He denies any neuropathy or lesions. Last diabetic eye exam was 12/2021, no retinopathy. He sees Dr. Delaney Meigs regularly, who has been treating his dry eyes.   Lab Results  Component Value Date   HGBA1C 6.6 10/05/2022  Normal albumin/Cr ratio of 17.4 in 09/2022.     OSA: Uses CPAP machine nightly. Denies any problems, tolerating it well. Feels refreshed when he wakes up. He continues to get benefit from CPAP use.     Obesity:  He limits portions of rice/carbs. Occasional small sugar-free cookie, infrequent desserts. He has 1 drink a night, 2 on the weekends.   Exercise:  walks daily (but didn't for over a month related to his diarrhea earlier in the Fall.   Wt Readings from Last 3 Encounters:  08/23/22 203 lb (92.1 kg)  04/15/22 203 lb 12.8 oz (92.4 kg)  03/16/22 204 lb (92.5 kg)    ED: He uses sildenafil 20mg  tablets as needed.  40mg  has been  effective (previously reported headache at 80 mg dose).   Immunization History  Administered Date(s) Administered   Fluad Quad(high Dose 65+) 02/23/2022   Influenza Split 03/31/2005, 04/01/2007, 04/21/2009, 04/08/2011, 04/19/2011, 03/01/2012, 03/07/2013, 01/31/2015, 03/12/2016, 03/31/2017   Influenza, High Dose Seasonal PF 04/17/2014, 03/07/2016, 03/29/2017, 02/28/2018, 02/21/2019, 02/06/2020, 02/05/2021   Influenza-Unspecified 04/07/2014, 02/04/2015   PFIZER(Purple Top)SARS-COV-2 Vaccination 07/03/2019, 07/24/2019, 03/14/2020   Pneumococcal Conjugate-13 01/22/2015   Pneumococcal Polysaccharide-23 06/07/2004, 04/21/2005, 10/11/2011, 03/01/2012, 10/12/2021   Td 06/07/2002, 04/22/2003   Tdap 03/01/2012   Zoster, Live 04/21/2008   Last colonoscopy: 03/2022 Dr. Myrtie Neither Last PSA: Lab Results  Component Value Date   PSA1 1.8 10/08/2021   PSA1 1.6 07/25/2020   PSA1 2.1 07/19/2019   PSA 1.4 05/20/2017   PSA 1.8 06/15/2016   PSA 3.5 04/12/2016   Dentist: once yearly Ophtho: yearly (more often due to dry eyes) Exercise: walks 30 minutes each morning. Plays golf 2x/week.  Uses weights and stretches 2-3x/week (before golf).  Patient Care Team: Joselyn Arrow, MD as PCP - General (Family Medicine) Talmage Coin, MD as Consulting Physician (Endocrinology) Myrtie Neither Andreas Blower, MD as Consulting Physician (Gastroenterology) Cherlyn Roberts, MD as Referring Physician (Dermatology) Helane Gunther, DPM as Consulting Physician (Podiatry) Christia Reading, MD as Consulting Physician (Otolaryngology) Fredrich Birks, OD as Referring Physician (Optometry) Tyrone Schimke, MD as Consulting Physician (Ophthalmology) Oculofacial plastic surgery: Dr. Delynn Flavin, Dr. Lacey Jensen  Dr. Irving Copas (dentist)  Dr. Wallace Cullens (dermatologist)   Depression Screening: Flowsheet Row Office Visit from 10/12/2021 in [facility]  PHQ-2 Total Score 0        Falls screen:     02/10/2022    2:35 PM 10/12/2021    8:37 AM  01/26/2021   10:36 AM 07/28/2020    9:47 AM 07/28/2020    9:46 AM  Fall Risk   Falls in the past year? 0 0 0 0 0  Number falls in past yr: 0 0 0    Injury with Fall? 0 0 0    Risk for fall due to : No Fall Risks No Fall Risks No Fall Risks    Follow up Falls evaluation completed Falls evaluation completed Falls evaluation completed       Functional Status Survey:        End of Life Discussion:  Patient has a living will and medical power of attorney. We do not have these in chart.    PMH, PSH, SH and FH reviewed      ROS: The patient denies anorexia, fever, headaches, vision loss, decreased hearing, ear pain, hoarseness, chest pain, palpitations, dizziness, syncope, dyspnea on exertion, cough, swelling, nausea, vomiting, constipation, melena, hematochezia, indigestion/heartburn, hematuria, incontinence, nocturia, weakened urine stream, dysuria, genital lesions, joint pains (just some morning stiffness, back), numbness, tingling, weakness, tremor, suspicious skin lesions, depression, anxiety, abnormal bleeding/bruising.  +erectile dysfunction. Some decrease in libido. No change to soft tissue mass on back of neck. Gets wax removed regularly by ENT, recent.  Dry eyes, burning discomfort. Under the care of Dr. Delaney Meigs, had multiple procedures and is doing a little better. Some back soreness/stiffnessin the mornings, short-lived. No radiation of pain or weakness.   L neck masses (Lipomas at L posterior neck and over L parotid, per CT) unchanged, nontender   PHYSICAL EXAM:  There were no vitals taken for this visit.   Wt Readings from Last 3 Encounters:  08/23/22 203 lb (92.1 kg)  04/15/22 203 lb 12.8 oz (92.4 kg)  03/16/22 204 lb (92.5 kg)    General Appearance:   Alert, cooperative, no distress, appears stated age    Head:   Normocephalic, without obvious abnormality, atraumatic    Eyes:   PERRL; mild conjunctival injection bilaterally, upper eyelashes on the right  pointing downward. EOM's intact, fundi benign    Ears:   Normal TM's and external ear canals  Some scarring noted on left.  Nose:   Normal, no drainage  Throat:   Normal mucosa, no lesions noted  Neck:   Supple; thyroid: no enlargement/tenderness/nodules; no carotid bruit or JVD. 3x3 cm soft tissue mass left posterior neck. 1.5 x 1.5 cm mass is located at left lower jaw, below L ear.  Nontender.  No cervical lymphadenopathy is noted  Back:   Spine nontender, no curvature, ROM normal, no CVA tenderness   Lungs:   Clear to auscultation bilaterally without wheezes, rales or ronchi; respirations unlabored    Chest Wall:   No tenderness or deformity    Heart:   Regular rate and rhythm, S1 and S2 normal, no murmur, rub   or gallop    Breast Exam:   No chest wall tenderness, masses or gynecomastia    Abdomen:   Soft, non-tender, nondistended, normoactive bowel sounds, no masses, no hepatosplenomegaly    Genitalia:   Normal male external genitalia without lesions. Testicles without masses. No inguinal hernias.    Rectal:   Exam was declined today  Extremities:   No clubbing, cyanosis or edema    Pulses:   2+ and symmetric all extremities    Skin:   Skin color, texture, turgor normal, no rashes. Many hyperkeratotic lesions on BLE . Onychomycotic toenails. Dry skin on legs.  Lymph nodes:   Cervical, supraclavicular, inguinal and axillary nodes normal.   Neurologic:   Normal strength, sensation and gait; reflexes 2+ and symmetric throughout                          Psych:   Normal mood, affect, hygiene and grooming.  ***UPDATE eyes Update neck mass--3x3?? And 1.5?  Diabetic foot exam performed by Dr. Sharl Ma 09/25/22, with normal monofilament exam.  ASSESSMENT/PLAN:  ABSTRACT DIABETIC FOOT EXAM AS DOCUMENTED IN DR [facility] NOTES 10/05/22 (SAW PODIATRIST IN [place], BUT GAP STILL SHOWING, SO DIDN'T DOCUMENT CORRECTLY TO CLOSE GAP...., SO ABSTRACT FROM EXAM DONE BY ENDO)  Did he get TdaP, RSV or shingrix  from pharmacy?  Had COVID 08/2022, Can offer booster, vs waiting for the Fall.  He declined prostate exam last year--hopefully he will agree to this today (we no longer check PSA's due to age).  PSA is no longer recommended based on age (and all have been normal).   Recommended at least 30 minutes of aerobic activity at least 5 days/week, weight-bearing exercise 2x/week; proper sunscreen use reviewed; healthy diet and alcohol recommendations (less than or equal to 2 drinks/day) reviewed; regular seatbelt use; changing batteries in smoke detectors. Immunization recommendations discussed--continue high dose flu shots yearly.   Shingrix recommended, needs to get from pharmacy. Tdap past due, to get from pharmacy. Bivalent COVID booster--he is now 3 months since COVID infection, so eligible, vs waiting until the Fall. RSV vaccine is recommended in the Fall. Colonoscopy recommendations reviewed--was due again 06/2021.  To contact GI and schedule   MOST form reviewed, Full Code, Full Care Asked to get [place] copies of his Living Will and healthcare POA.  6 mos med check, 1 year AWV F/u sooner prn   Medicare Attestation I have personally reviewed: The patient's medical and social history Their use of alcohol, tobacco or illicit drugs Their current medications and supplements The patient's functional ability including ADLs,fall risks, home safety risks, cognitive, and hearing and visual impairment Diet and physical activities Evidence for depression or mood disorders  The patient's weight, height, BMI have been recorded in the chart.  I have made referrals, counseling, and provided education to the patient based on review of the above and I have provided the patient with a written personalized care plan for preventive services.

## 2022-11-15 ENCOUNTER — Ambulatory Visit (INDEPENDENT_AMBULATORY_CARE_PROVIDER_SITE_OTHER): Payer: Medicare HMO | Admitting: Family Medicine

## 2022-11-15 ENCOUNTER — Encounter: Payer: Self-pay | Admitting: *Deleted

## 2022-11-15 ENCOUNTER — Encounter: Payer: Self-pay | Admitting: Family Medicine

## 2022-11-15 VITALS — BP 120/76 | HR 80 | Ht 68.0 in | Wt 195.8 lb

## 2022-11-15 DIAGNOSIS — E1159 Type 2 diabetes mellitus with other circulatory complications: Secondary | ICD-10-CM

## 2022-11-15 DIAGNOSIS — Z794 Long term (current) use of insulin: Secondary | ICD-10-CM

## 2022-11-15 DIAGNOSIS — G4733 Obstructive sleep apnea (adult) (pediatric): Secondary | ICD-10-CM

## 2022-11-15 DIAGNOSIS — I152 Hypertension secondary to endocrine disorders: Secondary | ICD-10-CM

## 2022-11-15 DIAGNOSIS — E785 Hyperlipidemia, unspecified: Secondary | ICD-10-CM | POA: Diagnosis not present

## 2022-11-15 DIAGNOSIS — R6882 Decreased libido: Secondary | ICD-10-CM

## 2022-11-15 DIAGNOSIS — Z5181 Encounter for therapeutic drug level monitoring: Secondary | ICD-10-CM

## 2022-11-15 DIAGNOSIS — E1165 Type 2 diabetes mellitus with hyperglycemia: Secondary | ICD-10-CM

## 2022-11-15 DIAGNOSIS — E782 Mixed hyperlipidemia: Secondary | ICD-10-CM | POA: Diagnosis not present

## 2022-11-15 DIAGNOSIS — Z0000 Encounter for general adult medical examination without abnormal findings: Secondary | ICD-10-CM | POA: Diagnosis not present

## 2022-11-15 DIAGNOSIS — I10 Essential (primary) hypertension: Secondary | ICD-10-CM | POA: Diagnosis not present

## 2022-11-15 DIAGNOSIS — N529 Male erectile dysfunction, unspecified: Secondary | ICD-10-CM | POA: Diagnosis not present

## 2022-11-15 DIAGNOSIS — E1169 Type 2 diabetes mellitus with other specified complication: Secondary | ICD-10-CM

## 2022-11-15 LAB — COMPREHENSIVE METABOLIC PANEL

## 2022-11-15 LAB — CBC WITH DIFFERENTIAL/PLATELET
EOS (ABSOLUTE): 0.1 10*3/uL (ref 0.0–0.4)
Hematocrit: 46.6 % (ref 37.5–51.0)
Immature Grans (Abs): 0 10*3/uL (ref 0.0–0.1)
Lymphs: 23 %
MCV: 96 fL (ref 79–97)
Platelets: 208 10*3/uL (ref 150–450)
RDW: 12.9 % (ref 11.6–15.4)

## 2022-11-15 LAB — TESTOSTERONE

## 2022-11-16 LAB — COMPREHENSIVE METABOLIC PANEL
ALT: 36 IU/L (ref 0–44)
AST: 28 IU/L (ref 0–40)
Albumin/Globulin Ratio: 2.5
Albumin: 4.8 g/dL (ref 3.8–4.8)
BUN: 24 mg/dL (ref 8–27)
CO2: 21 mmol/L (ref 20–29)
Globulin, Total: 1.9 g/dL (ref 1.5–4.5)
Glucose: 165 mg/dL — ABNORMAL HIGH (ref 70–99)
Potassium: 4.1 mmol/L (ref 3.5–5.2)
Sodium: 139 mmol/L (ref 134–144)
eGFR: 65 mL/min/{1.73_m2} (ref >59)

## 2022-11-16 LAB — CBC WITH DIFFERENTIAL/PLATELET
Basophils Absolute: 0 10*3/uL (ref 0.0–0.2)
Basos: 1 %
Eos: 2 %
Hemoglobin: 15.4 g/dL (ref 13.0–17.7)
Immature Granulocytes: 1 %
Lymphocytes Absolute: 1.3 10*3/uL (ref 0.7–3.1)
MCH: 31.7 pg (ref 26.6–33.0)
MCHC: 33 g/dL (ref 31.5–35.7)
Monocytes Absolute: 0.5 10*3/uL (ref 0.1–0.9)
Monocytes: 8 %
Neutrophils Absolute: 3.7 10*3/uL (ref 1.4–7.0)
Neutrophils: 65 %
RBC: 4.86 x10E6/uL (ref 4.14–5.80)
WBC: 5.6 10*3/uL (ref 3.4–10.8)

## 2022-11-16 LAB — TSH: TSH: 0.871 u[IU]/mL (ref 0.450–4.500)

## 2022-11-29 ENCOUNTER — Telehealth: Payer: Self-pay | Admitting: *Deleted

## 2022-11-29 NOTE — Progress Notes (Signed)
  Care Coordination   Note   11/29/2022 Name: [name] MRN: [id number] DOB: [birth year]  [name] is a 77 y.o. year old male who sees Joselyn Arrow, MD for primary care. I reached out to Judee Clara by phone today to offer care coordination services.  [name] was given information about Care Coordination services today including:   The Care Coordination services include support from the care team which includes your Nurse Coordinator, Clinical Social Worker, or Pharmacist.  The Care Coordination team is here to help remove barriers to the health concerns and goals most important to you. Care Coordination services are voluntary, and the patient may decline or stop services at any time by request to their care team member.   Care Coordination Consent Status: Patient did not agree to participate in care coordination services at this time.   Encounter Outcome:  Pt. Refused  [facility] Coordination Care Guide  Direct Dial: [phone]

## 2022-12-04 DIAGNOSIS — G4733 Obstructive sleep apnea (adult) (pediatric): Secondary | ICD-10-CM | POA: Diagnosis not present

## 2022-12-08 ENCOUNTER — Other Ambulatory Visit: Payer: Self-pay | Admitting: Family Medicine

## 2022-12-08 DIAGNOSIS — E782 Mixed hyperlipidemia: Secondary | ICD-10-CM

## 2022-12-08 DIAGNOSIS — I10 Essential (primary) hypertension: Secondary | ICD-10-CM

## 2022-12-27 DIAGNOSIS — H04123 Dry eye syndrome of bilateral lacrimal glands: Secondary | ICD-10-CM | POA: Diagnosis not present

## 2022-12-31 ENCOUNTER — Other Ambulatory Visit: Payer: Self-pay | Admitting: Family Medicine

## 2022-12-31 DIAGNOSIS — E782 Mixed hyperlipidemia: Secondary | ICD-10-CM

## 2023-01-03 DIAGNOSIS — G4733 Obstructive sleep apnea (adult) (pediatric): Secondary | ICD-10-CM | POA: Diagnosis not present

## 2023-01-04 DIAGNOSIS — H5203 Hypermetropia, bilateral: Secondary | ICD-10-CM | POA: Diagnosis not present

## 2023-01-04 LAB — HM DIABETES EYE EXAM

## 2023-01-12 ENCOUNTER — Encounter: Payer: Self-pay | Admitting: Podiatry

## 2023-01-12 ENCOUNTER — Ambulatory Visit (INDEPENDENT_AMBULATORY_CARE_PROVIDER_SITE_OTHER): Payer: Medicare HMO | Admitting: Podiatry

## 2023-01-12 DIAGNOSIS — N1831 Chronic kidney disease, stage 3a: Secondary | ICD-10-CM

## 2023-01-12 DIAGNOSIS — B351 Tinea unguium: Secondary | ICD-10-CM

## 2023-01-12 DIAGNOSIS — E1165 Type 2 diabetes mellitus with hyperglycemia: Secondary | ICD-10-CM

## 2023-01-12 DIAGNOSIS — M79674 Pain in right toe(s): Secondary | ICD-10-CM

## 2023-01-12 DIAGNOSIS — Z794 Long term (current) use of insulin: Secondary | ICD-10-CM

## 2023-01-12 DIAGNOSIS — M79675 Pain in left toe(s): Secondary | ICD-10-CM | POA: Diagnosis not present

## 2023-01-12 NOTE — Progress Notes (Signed)
This patient returns to my office for at risk foot care.  This patient requires this care by a professional since this patient will be at risk due to having type 2 diabetes.  This patient is unable to cut nails himself since the patient cannot reach his nails.These nails are painful walking and wearing shoes.  This patient presents for at risk foot care today.  General Appearance  Alert, conversant and in no acute stress.  Vascular  Dorsalis pedis and posterior tibial  pulses are palpable  bilaterally.  Capillary return is within normal limits  bilaterally. Temperature is within normal limits  bilaterally.  Neurologic  Senn-Weinstein monofilament wire test within normal limits  bilaterally. Muscle power within normal limits bilaterally.  Nails Thick disfigured discolored nails with subungual debris  from hallux to fifth toes bilaterally. No evidence of bacterial infection or drainage bilaterally.  Orthopedic  No limitations of motion  feet .  No crepitus or effusions noted.  No bony pathology or digital deformities noted.  Skin  normotropic skin with no porokeratosis noted bilaterally.  No signs of infections or ulcers noted.     Onychomycosis  Pain in right toes  Pain in left toes  Consent was obtained for treatment procedures.   Mechanical debridement of nails 1-5  bilaterally performed with a nail nipper.  Filed with dremel without incident.    Return office visit    9 weeks                  Told patient to return for periodic foot care and evaluation due to potential at risk complications.     DPM  

## 2023-01-27 ENCOUNTER — Other Ambulatory Visit: Payer: Self-pay | Admitting: Family Medicine

## 2023-01-27 DIAGNOSIS — I10 Essential (primary) hypertension: Secondary | ICD-10-CM

## 2023-01-27 DIAGNOSIS — E1165 Type 2 diabetes mellitus with hyperglycemia: Secondary | ICD-10-CM

## 2023-02-03 DIAGNOSIS — G4733 Obstructive sleep apnea (adult) (pediatric): Secondary | ICD-10-CM | POA: Diagnosis not present

## 2023-02-22 DIAGNOSIS — H10823 Rosacea conjunctivitis, bilateral: Secondary | ICD-10-CM | POA: Diagnosis not present

## 2023-03-09 DIAGNOSIS — H04123 Dry eye syndrome of bilateral lacrimal glands: Secondary | ICD-10-CM | POA: Diagnosis not present

## 2023-03-16 ENCOUNTER — Ambulatory Visit (INDEPENDENT_AMBULATORY_CARE_PROVIDER_SITE_OTHER): Payer: Medicare HMO | Admitting: Podiatry

## 2023-03-16 DIAGNOSIS — B353 Tinea pedis: Secondary | ICD-10-CM | POA: Diagnosis not present

## 2023-03-16 MED ORDER — CLOTRIMAZOLE-BETAMETHASONE 1-0.05 % EX CREA: 1.0000 | TOPICAL_CREAM | Freq: Every day | CUTANEOUS | 0 refills | Status: DC

## 2023-03-16 NOTE — Progress Notes (Signed)
This patient returns to my office for at risk foot care.  This patient requires this care by a professional since this patient will be at risk due to having type 2 diabetes.  This patient is unable to cut nails himself since the patient cannot reach his nails.These nails are painful walking and wearing shoes.  This patient presents for at risk foot care today.  General Appearance  Alert, conversant and in no acute stress.  Vascular  Dorsalis pedis and posterior tibial  pulses are palpable  bilaterally.  Capillary return is within normal limits  bilaterally. Temperature is within normal limits  bilaterally.  Neurologic  Senn-Weinstein monofilament wire test within normal limits  bilaterally. Muscle power within normal limits bilaterally.  Nails Thick disfigured discolored nails with subungual debris  from hallux to fifth toes bilaterally. No evidence of bacterial infection or drainage bilaterally.  Orthopedic  No limitations of motion  feet .  No crepitus or effusions noted.  No bony pathology or digital deformities noted.  Skin  normotropic skin with no porokeratosis noted bilaterally.  No signs of infections or ulcers noted.  Dry scaly skin noted plantar aspect feet  B/L.   Onychomycosis  Pain in right toes  Pain in left toes  Tinea pedis   Consent was obtained for treatment procedures.   Mechanical debridement of nails 1-5  bilaterally performed with a nail nipper.  Filed with dremel without incident.  Prescribe lotrisone.   Return office visit    9 weeks                  Told patient to return for periodic foot care and evaluation due to potential at risk complications.   Helane Gunther DPM

## 2023-03-18 DIAGNOSIS — H04123 Dry eye syndrome of bilateral lacrimal glands: Secondary | ICD-10-CM | POA: Diagnosis not present

## 2023-03-25 DIAGNOSIS — H00025 Hordeolum internum left lower eyelid: Secondary | ICD-10-CM | POA: Diagnosis not present

## 2023-03-30 DIAGNOSIS — L570 Actinic keratosis: Secondary | ICD-10-CM | POA: Diagnosis not present

## 2023-04-06 ENCOUNTER — Other Ambulatory Visit: Payer: Self-pay | Admitting: Medical Genetics

## 2023-04-06 DIAGNOSIS — Z006 Encounter for examination for normal comparison and control in clinical research program: Secondary | ICD-10-CM

## 2023-04-28 ENCOUNTER — Other Ambulatory Visit: Payer: Self-pay | Admitting: Family Medicine

## 2023-04-28 DIAGNOSIS — I10 Essential (primary) hypertension: Secondary | ICD-10-CM

## 2023-04-28 DIAGNOSIS — E1165 Type 2 diabetes mellitus with hyperglycemia: Secondary | ICD-10-CM

## 2023-05-02 ENCOUNTER — Other Ambulatory Visit (HOSPITAL_COMMUNITY): Payer: Self-pay

## 2023-05-16 ENCOUNTER — Encounter: Payer: Medicare HMO | Admitting: Family Medicine

## 2023-06-03 ENCOUNTER — Other Ambulatory Visit: Payer: Self-pay | Admitting: Family Medicine

## 2023-06-03 DIAGNOSIS — I10 Essential (primary) hypertension: Secondary | ICD-10-CM

## 2023-06-03 DIAGNOSIS — E782 Mixed hyperlipidemia: Secondary | ICD-10-CM

## 2023-06-17 ENCOUNTER — Encounter: Payer: Self-pay | Admitting: Podiatry

## 2023-06-17 ENCOUNTER — Ambulatory Visit (INDEPENDENT_AMBULATORY_CARE_PROVIDER_SITE_OTHER): Payer: PPO | Admitting: Podiatry

## 2023-06-17 DIAGNOSIS — B351 Tinea unguium: Secondary | ICD-10-CM | POA: Diagnosis not present

## 2023-06-17 DIAGNOSIS — M79675 Pain in left toe(s): Secondary | ICD-10-CM

## 2023-06-17 DIAGNOSIS — Z794 Long term (current) use of insulin: Secondary | ICD-10-CM

## 2023-06-17 DIAGNOSIS — E1165 Type 2 diabetes mellitus with hyperglycemia: Secondary | ICD-10-CM

## 2023-06-17 DIAGNOSIS — M79674 Pain in right toe(s): Secondary | ICD-10-CM

## 2023-06-17 DIAGNOSIS — N1831 Chronic kidney disease, stage 3a: Secondary | ICD-10-CM | POA: Diagnosis not present

## 2023-06-17 NOTE — Progress Notes (Signed)
This patient returns to my office for at risk foot care.  This patient requires this care by a professional since this patient will be at risk due to having type 2 diabetes.  This patient is unable to cut nails himself since the patient cannot reach his nails.These nails are painful walking and wearing shoes.  This patient presents for at risk foot care today.  General Appearance  Alert, conversant and in no acute stress.  Vascular  Dorsalis pedis and posterior tibial  pulses are palpable  bilaterally.  Capillary return is within normal limits  bilaterally. Temperature is within normal limits  bilaterally.  Neurologic  Senn-Weinstein monofilament wire test within normal limits  bilaterally. Muscle power within normal limits bilaterally.  Nails Thick disfigured discolored nails with subungual debris  from hallux to fifth toes bilaterally. No evidence of bacterial infection or drainage bilaterally.  Orthopedic  No limitations of motion  feet .  No crepitus or effusions noted.  No bony pathology or digital deformities noted.  Skin  normotropic skin with no porokeratosis noted bilaterally.  No signs of infections or ulcers noted.  Dry scaly skin noted plantar aspect feet  B/L.   Onychomycosis  Pain in right toes  Pain in left toes  Tinea pedis   Consent was obtained for treatment procedures.   Mechanical debridement of nails 1-5  bilaterally performed with a nail nipper.  Filed with dremel without incident.  Prescribe lotrisone.   Return office visit    9 weeks                  Told patient to return for periodic foot care and evaluation due to potential at risk complications.   Helane Gunther DPM

## 2023-06-28 ENCOUNTER — Other Ambulatory Visit: Payer: Self-pay | Admitting: Family Medicine

## 2023-06-28 DIAGNOSIS — E782 Mixed hyperlipidemia: Secondary | ICD-10-CM

## 2023-07-23 ENCOUNTER — Other Ambulatory Visit: Payer: Self-pay | Admitting: Family Medicine

## 2023-07-23 DIAGNOSIS — E1165 Type 2 diabetes mellitus with hyperglycemia: Secondary | ICD-10-CM

## 2023-07-23 DIAGNOSIS — I10 Essential (primary) hypertension: Secondary | ICD-10-CM

## 2023-08-19 ENCOUNTER — Ambulatory Visit: Payer: Medicare HMO | Admitting: Podiatry

## 2023-08-19 ENCOUNTER — Encounter: Payer: Self-pay | Admitting: Podiatry

## 2023-08-19 DIAGNOSIS — M79674 Pain in right toe(s): Secondary | ICD-10-CM | POA: Diagnosis not present

## 2023-08-19 DIAGNOSIS — E1165 Type 2 diabetes mellitus with hyperglycemia: Secondary | ICD-10-CM | POA: Diagnosis not present

## 2023-08-19 DIAGNOSIS — B351 Tinea unguium: Secondary | ICD-10-CM | POA: Diagnosis not present

## 2023-08-19 DIAGNOSIS — Z794 Long term (current) use of insulin: Secondary | ICD-10-CM

## 2023-08-19 DIAGNOSIS — N1831 Chronic kidney disease, stage 3a: Secondary | ICD-10-CM | POA: Diagnosis not present

## 2023-08-19 DIAGNOSIS — M79675 Pain in left toe(s): Secondary | ICD-10-CM | POA: Diagnosis not present

## 2023-08-19 NOTE — Progress Notes (Signed)
This patient returns to my office for at risk foot care.  This patient requires this care by a professional since this patient will be at risk due to having type 2 diabetes.  This patient is unable to cut nails himself since the patient cannot reach his nails.These nails are painful walking and wearing shoes.  This patient presents for at risk foot care today.  General Appearance  Alert, conversant and in no acute stress.  Vascular  Dorsalis pedis and posterior tibial  pulses are palpable  bilaterally.  Capillary return is within normal limits  bilaterally. Temperature is within normal limits  bilaterally.  Neurologic  Senn-Weinstein monofilament wire test within normal limits  bilaterally. Muscle power within normal limits bilaterally.  Nails Thick disfigured discolored nails with subungual debris  from hallux to fifth toes bilaterally. No evidence of bacterial infection or drainage bilaterally.  Orthopedic  No limitations of motion  feet .  No crepitus or effusions noted.  No bony pathology or digital deformities noted.  Skin  normotropic skin with no porokeratosis noted bilaterally.  No signs of infections or ulcers noted.  Dry scaly skin noted plantar aspect feet  B/L.   Onychomycosis  Pain in right toes  Pain in left toes  Tinea pedis   Consent was obtained for treatment procedures.   Mechanical debridement of nails 1-5  bilaterally performed with a nail nipper.  Filed with dremel without incident.  Prescribe lotrisone.   Return office visit    9 weeks                  Told patient to return for periodic foot care and evaluation due to potential at risk complications.   Helane Gunther DPM

## 2023-09-17 IMAGING — CT CT NECK W/ CM
5 of 6 series · 14 of 35 positions shown, 16 images · IV contrast (agent unspecified)
Comparison: CT orbits 09/20/2019, soft tissue ultrasound 03/31/2018

CLINICAL DATA: Lipoma in left posterior neck, some increased in
size over the last year

EXAM:
CT NECK WITH CONTRAST
TECHNIQUE: Multidetector CT imaging of the neck was performed using the
standard protocol following the bolus administration of intravenous
contrast.

[Series 2: neck 2.00 br40 s3 st/ no angle · axial · 0.52mm/px · z∈[-747,-656]mm · 2 of 138 slices shown, 3 images]
[im 46/138  soft-tissue]
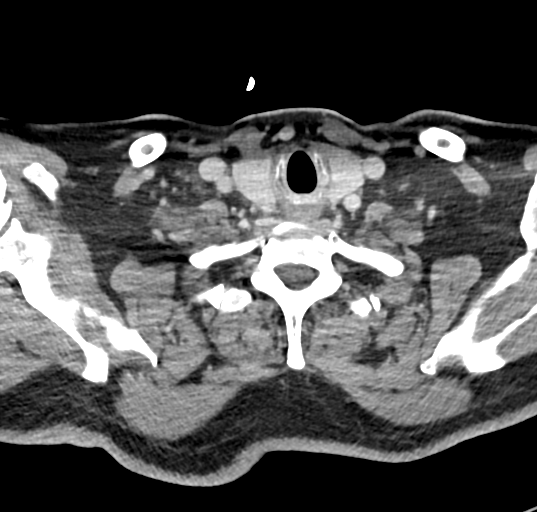
[im 46/138  bone]
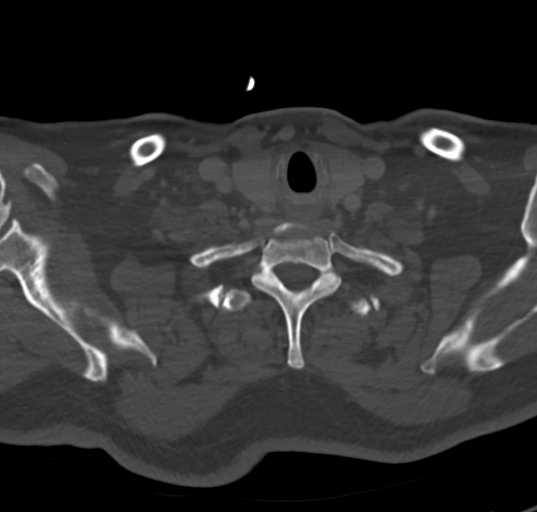
[im 92/138  bone]
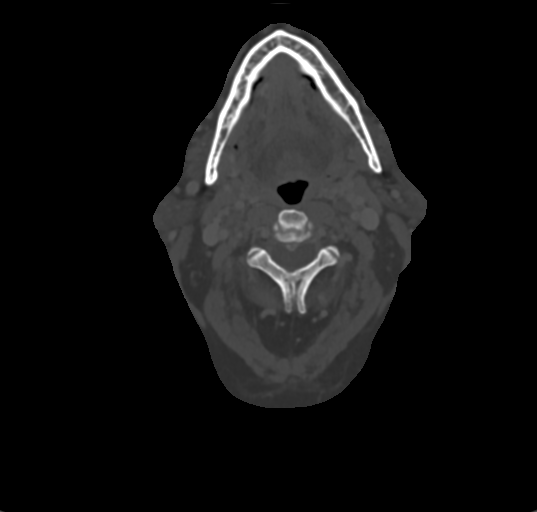

[Series 4: neck 2.00 br60 s3 bone/ no angle · axial · 0.52mm/px · z∈[-747,-656]mm · 2 of 138 slices shown]
[im 46/138  bone]
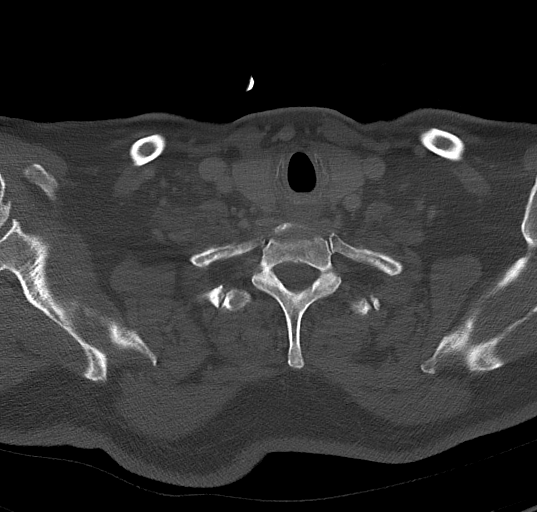
[im 92/138  bone]
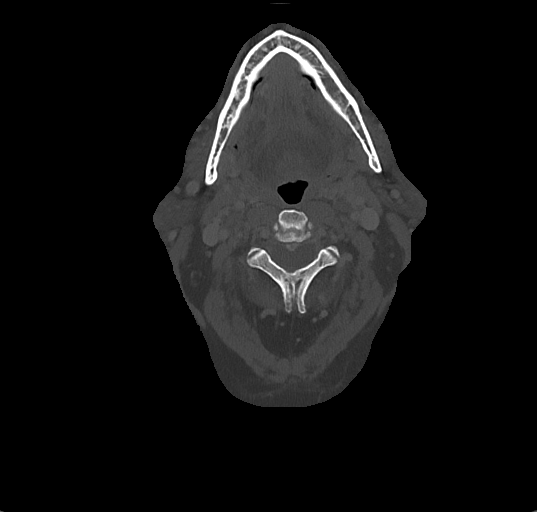

[Series 6: neck 2.00 br36 s3 angled axial (person_name) · axial · 0.52mm/px · z∈[-747,-656]mm · 2 of 138 slices shown]
[im 46/138  bone]
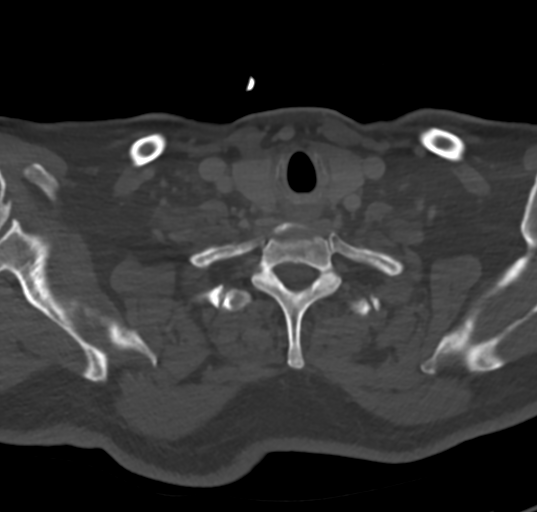
[im 92/138  bone]
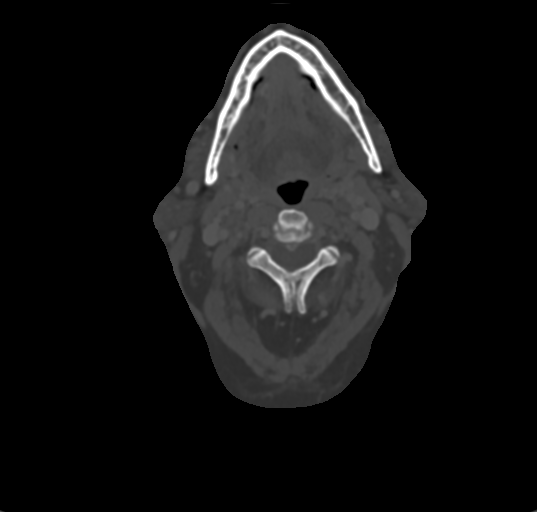

[Series 10: neck 2.00 br40 s3 (person_name) · coronal · 0.54mm/px · 3 of 152 slices shown (1 of 2)]
[im 31/152  bone]
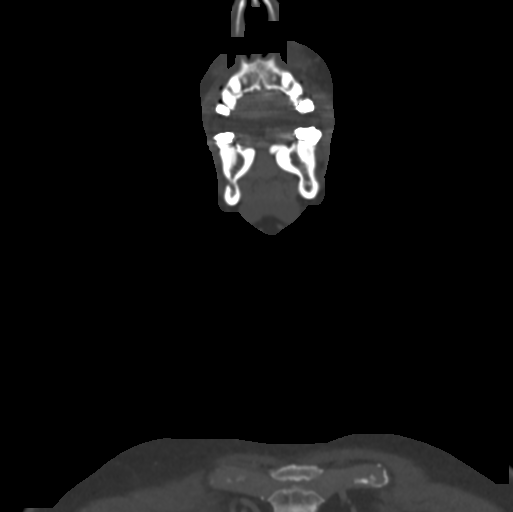
[im 61/152  bone]
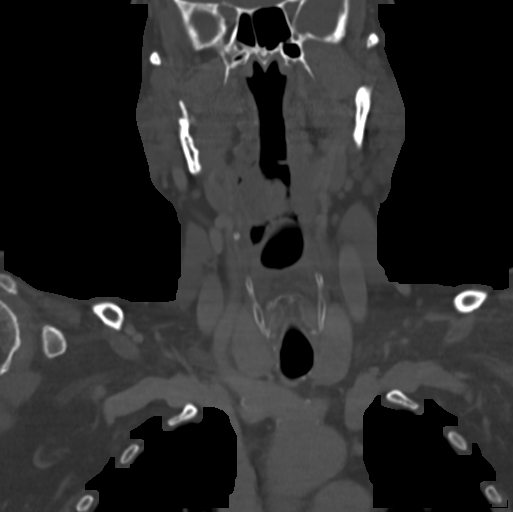
[im 91/152  bone]
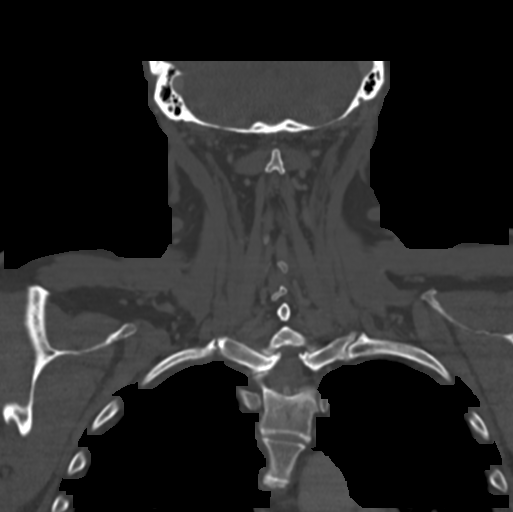

[Series 12: neck 2.00 br40 s3 (person_name) · sagittal · 0.52mm/px · 5 of 138 slices shown, 6 images (2 of 2)]
[im 46/138  bone]
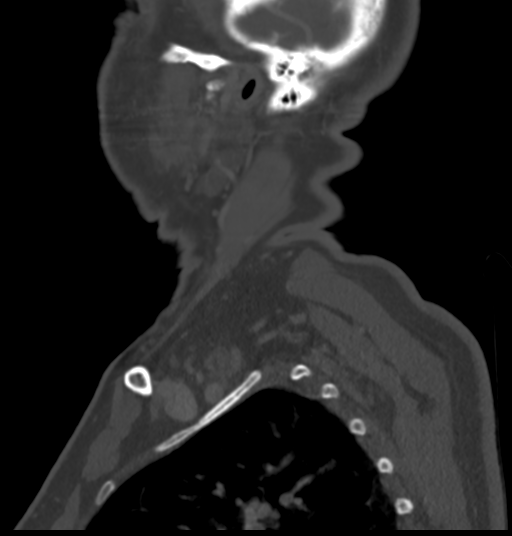
[im 58/138  bone]
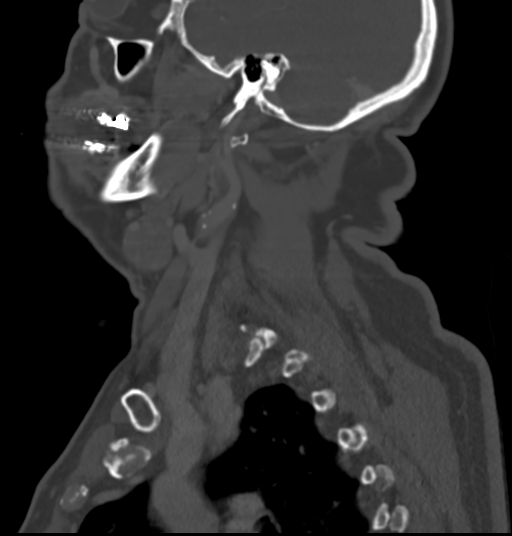
[im 69/138  soft-tissue]
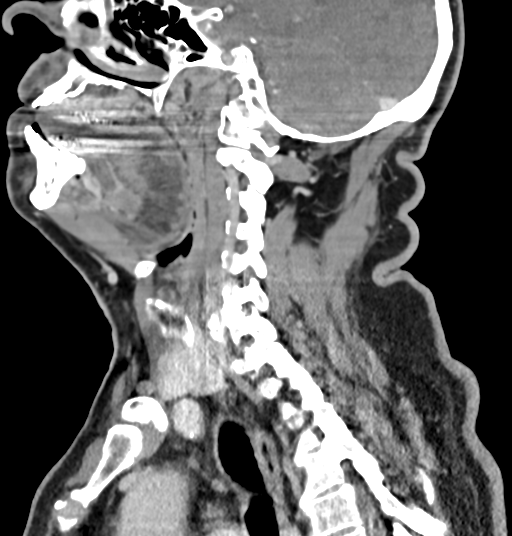
[im 69/138  bone]
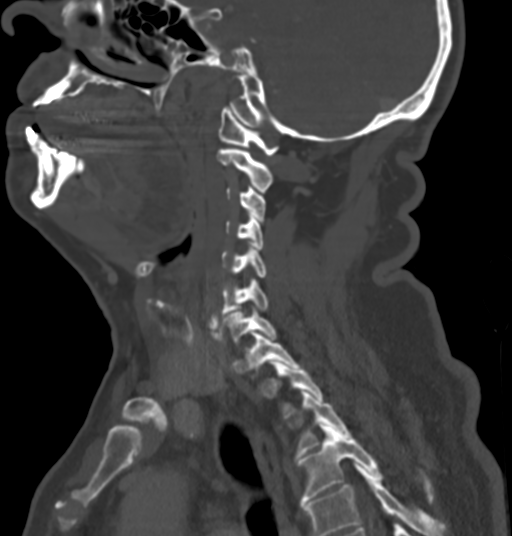
[im 80/138  bone]
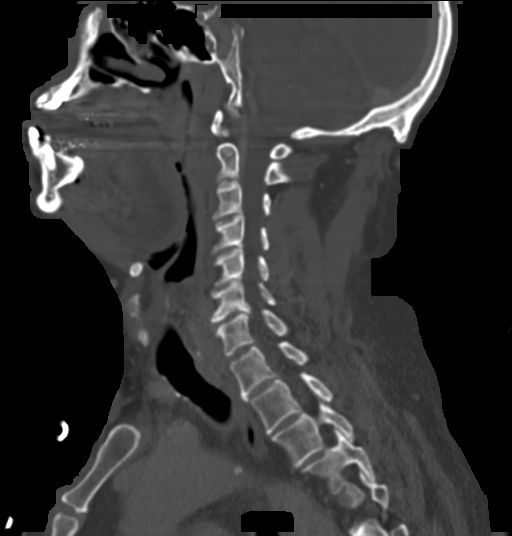
[im 92/138  bone]
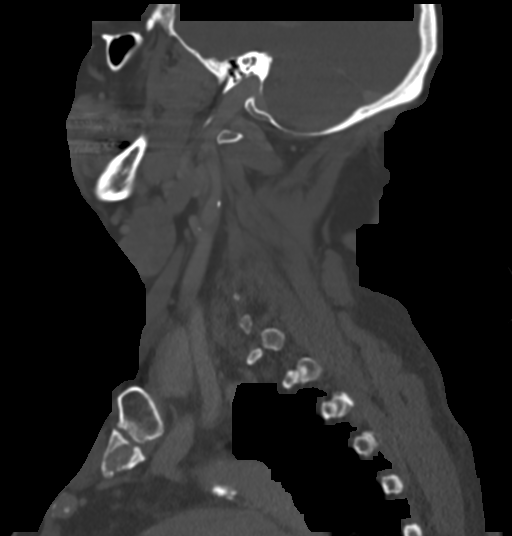

[14 of 35 positions shown; findings below may reference images not displayed]

RADIATION DOSE REDUCTION: This exam was performed according to the
departmental dose-optimization program which includes automated
exposure control, adjustment of the mA and/or kV according to
patient size and/or use of iterative reconstruction technique.

CONTRAST:  75mL YBB22K-IHH IOPAMIDOL (YBB22K-IHH) INJECTION 61%
FINDINGS: Pharynx and larynx: The nasal cavity and nasopharynx are
unremarkable.

The oral cavity and oropharynx are unremarkable. The parapharyngeal
spaces are clear.

The hypopharynx and larynx are unremarkable. Vocal folds are normal
in appearance.

There is no abnormal fluid collection, mass lesion, or enhancement.

Salivary glands: There is a 1.3 cm x 0.7 cm fat density lesion
overlying the superficial surface of the left parotid gland
corresponding to the marker. There are no suspicious features. The
left parotid gland itself is unremarkable. The right parotid gland
and bilateral submandibular glands are unremarkable.

Thyroid: Unremarkable.

Lymph nodes: Is no pathologic lymphadenopathy in the neck.

Vascular: There is mild calcified atherosclerotic plaque of the
bilateral carotid bifurcations.

Limited intracranial: Imaged intracranial compartment is
unremarkable.

Visualized orbits: The imaged globes and orbits are unremarkable.

Mastoids and visualized paranasal sinuses: The imaged paranasal
sinuses and mastoid air cells are clear.

Skeleton: Is degenerative change of the cervical spine most advanced
at C5-C6 and C6-C7. There is no acute osseous abnormality or
suspicious osseous lesion.

Upper chest: The imaged lung apices are clear.

Other: There is a 2.3 cm x 1.4 cm by 3.1 cm fat density lesion in
the subcutaneous fat in the left posterior neck overlying the
paraspinal musculature corresponding to the marker. The imaging
features are in keeping with a lipoma, and the lesion does not
appear substantially changed in size and maximum dimension compared
to the ultrasound from 9736, allowing for difference in modality.
IMPRESSION: 1. 2.3 cm x 1.4 cm x 3.1 cm fat density lesion in the left posterior
neck has imaging features in keeping with a lipoma, and is not
substantially changed in size compared to the ultrasound from 9736,
allowing for differences in modality.
2. Smaller fat density lesion measuring 1.3 cm overlying the
superficial aspect of the left parotid gland corresponding to the
palpable marker, also most in keeping with a small lipoma.
3. No suspicious mass lesion or pathologic lymphadenopathy in the
neck.

## 2023-10-04 LAB — HEMOGLOBIN A1C: Hemoglobin A1C: 6.6

## 2023-10-22 ENCOUNTER — Other Ambulatory Visit: Payer: Self-pay | Admitting: Family Medicine

## 2023-10-22 DIAGNOSIS — E1165 Type 2 diabetes mellitus with hyperglycemia: Secondary | ICD-10-CM

## 2023-10-22 DIAGNOSIS — I10 Essential (primary) hypertension: Secondary | ICD-10-CM

## 2023-11-04 ENCOUNTER — Encounter: Payer: Self-pay | Admitting: Podiatry

## 2023-11-04 ENCOUNTER — Ambulatory Visit: Admitting: Podiatry

## 2023-11-04 DIAGNOSIS — M79675 Pain in left toe(s): Secondary | ICD-10-CM | POA: Diagnosis not present

## 2023-11-04 DIAGNOSIS — B351 Tinea unguium: Secondary | ICD-10-CM | POA: Diagnosis not present

## 2023-11-04 DIAGNOSIS — E1165 Type 2 diabetes mellitus with hyperglycemia: Secondary | ICD-10-CM | POA: Diagnosis not present

## 2023-11-04 DIAGNOSIS — M79674 Pain in right toe(s): Secondary | ICD-10-CM | POA: Diagnosis not present

## 2023-11-04 DIAGNOSIS — Z794 Long term (current) use of insulin: Secondary | ICD-10-CM | POA: Diagnosis not present

## 2023-11-04 NOTE — Progress Notes (Signed)
 This patient returns to my office for at risk foot care.  This patient requires this care by a professional since this patient will be at risk due to having type 2 diabetes.  This patient is unable to cut nails himself since the patient cannot reach his nails.These nails are painful walking and wearing shoes.  This patient presents for at risk foot care today.  General Appearance  Alert, conversant and in no acute stress.  Vascular  Dorsalis pedis and posterior tibial  pulses are palpable  bilaterally.  Capillary return is within normal limits  bilaterally. Temperature is within normal limits  bilaterally.  Neurologic  Senn-Weinstein monofilament wire test within normal limits  bilaterally. Muscle power within normal limits bilaterally.  Nails Thick disfigured discolored nails with subungual debris  from hallux to fifth toes bilaterally. No evidence of bacterial infection or drainage bilaterally.  Orthopedic  No limitations of motion  feet .  No crepitus or effusions noted.  No bony pathology or digital deformities noted.  Skin  normotropic skin with no porokeratosis noted bilaterally.  No signs of infections or ulcers noted.    Onychomycosis  Pain in right toes  Pain in left toes    Consent was obtained for treatment procedures.   Mechanical debridement of nails 1-5  bilaterally performed with a nail nipper.  Filed with dremel without incident.  Prescribe lotrisone .   Return office visit    10 weeks                  Told patient to return for periodic foot care and evaluation due to potential at risk complications.   Ruffin Cotton DPM

## 2023-11-20 NOTE — Progress Notes (Unsigned)
 No chief complaint on file.   [name] is a 78 y.o. male who presents for annual wellness visit and follow-up on chronic medical conditions.   Hypertension follow-up: He is taking amlodipine  and Losartan .  He denies any side effects. He denies headaches, dizziness, chest pain, palpitations, edema or shortness of breath.   BP Readings from Last 3 Encounters:  11/15/22 120/76  04/15/22 132/86  03/16/22 (!) 143/85   Hyperlipidemia follow-up: Patient is reportedly following a low-fat, low cholesterol diet. Compliant with taking his Crestor ,Tricor , and 3 fish oil daily (for his dry eyes), and denies medication side effects.  He eats red meat once a week, limits fried foods. Lipids were at goal on last check that we have on file, doesn't appear that endo checked lipids at last visit.   Lab Results  Component Value Date   CHOL 118 10/05/2022   HDL 40 10/05/2022   LDLCALC 60 10/05/2022   TRIG 94 10/05/2022   CHOLHDL 4.6 10/08/2021     Diabetes is managed by Dr. Kathyanne Parkers.  Last A1c was 6.6% in April. He continues on ozempic, farxiga , Novolog and Tresiba. Sugars are running ***  Rare hypoglycemia--only once in the last 90 days.  He uses Dexcom G7, has alarms. Denies polydipsia, polyuria.  Gets routine care at podiatrist. He denies any neuropathy or lesions. Last diabetic eye exam was 12/2021, no retinopathy. *** He sees Dr. Kirkland Peppers for dry eyes, doing well.    OSA: Uses CPAP machine nightly. Denies any problems, tolerating it well. Feels refreshed when he wakes up. He continues to get benefit from CPAP use.   Obesity:  He is now on Ozempic through endo. He limits portions of rice/carbs. Doesn't eat much desserts, no longer has sugar-free cookies. He has 1 drink a night, 1.5 on the weekends.  No longer having a beer after golf. Exercise:  walks daily; uses handweights 3x/week.  Wt Readings from Last 3 Encounters:  11/15/22 195 lb 12.8 oz (88.8 kg)  08/23/22 203 lb (92.1 kg)   04/15/22 203 lb 12.8 oz (92.4 kg)     Immunization History  Administered Date(s) Administered   Fluad Quad(high Dose 65+) 02/23/2022   Fluzone Influenza virus vaccine,trivalent (IIV3), split virus 04/21/2009, 03/07/2013, 01/31/2015, 03/12/2016, 03/31/2017, 03/18/2022   Influenza Split 03/31/2005, 04/01/2007, 04/08/2011, 04/19/2011, 03/01/2012   Influenza, High Dose Seasonal PF 04/17/2014, 03/07/2016, 03/29/2017, 02/28/2018, 02/21/2019, 02/06/2020, 02/05/2021   Influenza-Unspecified 04/07/2014, 02/04/2015   PFIZER(Purple Top)SARS-COV-2 Vaccination 07/03/2019, 07/24/2019, 03/14/2020   Pneumococcal Conjugate-13 01/22/2015   Pneumococcal Polysaccharide-23 06/07/2004, 04/21/2005, 10/11/2011, 03/01/2012, 10/12/2021   Td 06/07/2002, 04/22/2003   Tdap 03/01/2012   Zoster, Live 04/21/2008   Last colonoscopy: 03/2022 Dr. Dominic Friendly Last PSA: Lab Results  Component Value Date   PSA1 1.8 10/08/2021   PSA1 1.6 07/25/2020   PSA1 2.1 07/19/2019   PSA 1.4 05/20/2017   PSA 1.8 06/15/2016   PSA 3.5 04/12/2016   Dentist: once yearly Ophtho: yearly (more often due to dry eyes) Exercise: walks 30 minutes each morning. Uses handweights 3x/week. Plays golf 2x/week.   Patient Care Team: Roosvelt Colla, MD as PCP - General (Family Medicine) Gordy Lauber, MD as Consulting Physician (Endocrinology) Dominic Friendly Cordelia Dessert, MD as Consulting Physician (Gastroenterology) Eduardo Grade, MD as Referring Physician (Dermatology) Ruffin Cotton, DPM as Consulting Physician (Podiatry) Virgina Grills, MD as Consulting Physician (Otolaryngology) Nelva Bang, OD as Referring Physician (Optometry) Artemisa Lars, MD as Consulting Physician (Ophthalmology) Oculofacial plastic surgery: Dr. Joseph Nickel, Dr. Zalvador  Dr. Ronalee Cocking (dentist)  Dr.  Martina Sledge (dermatologist)   Depression Screening: Flowsheet Row Office Visit from 11/15/2022 in [facility]  PHQ-2 Total Score 0     Falls screen:     11/15/2022     9:34 AM 02/10/2022    2:35 PM 10/12/2021    8:37 AM 01/26/2021   10:36 AM 07/28/2020    9:47 AM  Fall Risk   Falls in the past year? 0 0 0 0 0  Number falls in past yr: 0 0 0 0   Injury with Fall? 0 0 0 0   Risk for fall due to : No Fall Risks No Fall Risks No Fall Risks No Fall Risks   Follow up Falls evaluation completed Falls evaluation completed  Falls evaluation completed  Falls evaluation completed       Data saved with a previous flowsheet row definition     Functional Status Survey:        End of Life Discussion:  Patient has a living will and medical power of attorney. We do not have these in chart.    PMH, PSH, SH and FH reviewed      ROS: The patient denies anorexia, fever, headaches, vision loss, decreased hearing, ear pain, hoarseness, chest pain, palpitations, dizziness, syncope, dyspnea on exertion, cough, swelling, nausea, vomiting, constipation, melena, hematochezia, indigestion/heartburn, hematuria, incontinence, nocturia, weakened urine stream, dysuria, genital lesions, joint pains (just some morning stiffness, back), numbness, tingling, weakness, tremor, suspicious skin lesions, depression, anxiety, abnormal bleeding/bruising.  +erectile dysfunction. Some decrease in libido. Gets wax removed regularly by ENT, recent. Has dry eyes, and persistent burning discomfort. Under the care of Dr. Kirkland Peppers, had multiple procedures and is doing a little better. Some back soreness/stiffnessin the mornings, short-lived. No radiation of pain or weakness.  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   Wt Readings from Last 3 Encounters:  11/15/22 195 lb 12.8 oz (88.8 kg)  08/23/22 203 lb (92.1 kg)  04/15/22 203 lb 12.8 oz (92.4 kg)    General Appearance:   Alert, cooperative, no distress, appears stated age    Head:   Normocephalic, without obvious  abnormality, atraumatic    Eyes:   PERRL; mild conjunctival injection bilaterally, a portion of the upper eyelashes on the right are pointing downward. EOM's intact, fundi benign    Ears:   Normal TM's and external ear canals  A small bit of non-obstructing cerumen is present on the left.  Nose:   Normal, no drainage  Throat:   Normal mucosa, no lesions noted  Neck:   Supple; thyroid : no enlargement/tenderness/nodules; no carotid bruit or JVD. 3x3 cm soft tissue mass left posterior neck. 1.5 x 1.5 cm mass is located at left lower jaw, below L ear.  Nontender.  No cervical lymphadenopathy is noted  Back:   Spine nontender, no curvature, ROM normal, no CVA tenderness   Lungs:   Clear to auscultation bilaterally without wheezes, rales or ronchi; respirations unlabored    Chest Wall:   No tenderness or deformity    Heart:   Regular rate and rhythm, S1 and S2 normal, no murmur, rub   or gallop    Breast Exam:   No chest wall tenderness, masses or gynecomastia    Abdomen:   Soft, non-tender, nondistended, normoactive bowel sounds, no masses, no hepatosplenomegaly    Genitalia:   Normal male external genitalia  without lesions. Testicles without masses. No inguinal hernias.  Some fullness noted on the right/asymmetric, but no hernia present  Rectal:   Normal sphincter tone.  Prostate is mildly enlarged, smooth, no nodules.  Soft, light brown heme negative stool  Extremities:   No clubbing, cyanosis or edema    Pulses:   2+ and symmetric all extremities    Skin:   Skin color, texture, turgor normal, no rashes. Many hyperkeratotic lesions on BLE . Dry skin on legs.  Lymph nodes:   Cervical, supraclavicular, inguinal and axillary nodes normal.   Neurologic:   Normal strength, sensation and gait; reflexes 2+ and symmetric throughout                          Psych:   Normal mood, affect, hygiene and grooming.  DIABETIC FOOT EXAM *** ***UPDATE R UPPER EYELASHES UPDATE LIPOMAS  neck  ASSESSMENT/PLAN:  Irena Manners RF's his BP and lipid meds for 6 mos in December (90 with a refill) when he had cancelled his December med check earlier that month and didn't r/s. THAT is where we should have caught that and had him r/s his med check!  You don't have labs placed.  I looked at Dr. Antoniette Klein last note, and only A1c was done, 10/04/23 6.6.  This needs to be abstracted. Check with pt to see if any other labs done at April visit (I didn't see anything ordered).  09/2022 is when he had lipids, urine microalb checked by Dr. Kathyanne Parkers, and foot exam. None of this documented at 09/2023 visit, so we need to do-- C-met, cbc, urine microalb, TSH, lipids  He sees Dr. Zettie Hillock for foot exams--NOT DOCUMENTED. Saw him 5/30, f/u in August  Did he get flu shot? TdaP? Shingrix, Covid or RSV? Prevnar-20? Can give today if hasn't gotten  Need diabetic eye exam (last in chart from 12/2021)  PSA is no longer recommended based on age (and all have been normal).   Recommended at least 30 minutes of aerobic activity at least 5 days/week, weight-bearing exercise 2x/week; proper sunscreen use reviewed; healthy diet and alcohol recommendations (less than or equal to 2 drinks/day) reviewed; regular seatbelt use; changing batteries in smoke detectors. Immunization recommendations discussed--continue high dose flu shots yearly.   Prevnar-20 *** Shingrix recommended, needs to get from pharmacy. Tdap past due, to get from pharmacy. Bivalent COVID booster past due, can get now vs waiting until the Fall. RSV vaccine is recommended in the Fall. Colonoscopy recommendations reviewed--UTD   MOST form reviewed, Full Code, Full Care Asked to get us  copies of his Living Will and healthcare POA.  6 mos med check, 1 year AWV F/u sooner prn   Medicare Attestation I have personally reviewed: The patient's medical and social history Their use of alcohol, tobacco or illicit drugs Their current medications and supplements The  patient's functional ability including ADLs,fall risks, home safety risks, cognitive, and hearing and visual impairment Diet and physical activities Evidence for depression or mood disorders  The patient's weight, height, BMI have been recorded in the chart.  I have made referrals, counseling, and provided education to the patient based on review of the above and I have provided the patient with a written personalized care plan for preventive services.

## 2023-11-20 NOTE — Patient Instructions (Incomplete)
 HEALTH MAINTENANCE RECOMMENDATIONS:  It is recommended that you get at least 30 minutes of aerobic exercise at least 5 days/week (for weight loss, you may need as much as 60-90 minutes). This can be any activity that gets your heart rate up. This can be divided in 10-15 minute intervals if needed, but try and build up your endurance at least once a week.  Weight bearing exercise is also recommended twice weekly.  Eat a healthy diet with lots of vegetables, fruits and fiber.  Colorful foods have a lot of vitamins (ie green vegetables, tomatoes, red peppers, etc).  Limit sweet tea, regular sodas and alcoholic beverages, all of which has a lot of calories and sugar.  Up to 2 alcoholic drinks daily may be beneficial for men (unless trying to lose weight, watch sugars).  Drink a lot of water.  Sunscreen of at least SPF 30 should be used on all sun-exposed parts of the skin when outside between the hours of 10 am and 4 pm (not just when at beach or pool, but even with exercise, golf, tennis, and yard work!)  Use a sunscreen that says broad spectrum so it covers both UVA and UVB rays, and make sure to reapply every 1-2 hours.  Remember to change the batteries in your smoke detectors when changing your clock times in the spring and fall.  Carbon monoxide detectors are recommended for your home.  Use your seat belt every time you are in a car, and please drive safely and not be distracted with cell phones and texting while driving.    [name] , Thank you for taking time to come for your Medicare Wellness Visit. I appreciate your ongoing commitment to your health goals. Please review the following plan we discussed and let me know if I can assist you in the future.   This is a list of the screening recommended for you and due dates:  Health Maintenance  Topic Date Due   Zoster (Shingles) Vaccine (1 of 2) 10/02/1964   DTaP/Tdap/Td vaccine (4 - Td or Tdap) 03/01/2022   Eye exam for diabetics   12/30/2022   COVID-19 Vaccine (4 - 2024-25 season) 02/06/2023   Hemoglobin A1C  04/06/2023   Yearly kidney health urinalysis for diabetes  10/05/2023   Complete foot exam   10/05/2023   Yearly kidney function blood test for diabetes  11/15/2023   Flu Shot  01/06/2024   Medicare Annual Wellness Visit  11/20/2024   Pneumococcal Vaccine for age over 67  Completed   Hepatitis C Screening  Completed   HPV Vaccine  Aged Out   Meningitis B Vaccine  Aged Out   Colon Cancer Screening  Discontinued   Please bring us  copies of your Living Will and Healthcare Power of Attorney so that it can be scanned into your medical chart.  Please get RSV vaccine from the pharmacy in the Fall. Continue to get high dose flu shots every year, in the Fall. You are past due for tetanus booster (TdaP)--please get this from the pharmacy. COVID boosters are recommended every 6 months for those >65. You can get one now, vs wait until the updated one comes out in the Fall. Either get vaccines the same day, or 2 weeks apart.  I recommend getting the new shingles vaccine (Shingrix). Since you have Medicare, you will need to get this from the pharmacy, as it is covered by Part D. This is a series of 2 injections, spaced 2 months apart.  This should be separated from other vaccines by at least 2 weeks.

## 2023-11-21 ENCOUNTER — Encounter: Payer: Self-pay | Admitting: Family Medicine

## 2023-11-21 ENCOUNTER — Encounter: Payer: Self-pay | Admitting: *Deleted

## 2023-11-21 ENCOUNTER — Ambulatory Visit (INDEPENDENT_AMBULATORY_CARE_PROVIDER_SITE_OTHER): Payer: Medicare HMO | Admitting: Family Medicine

## 2023-11-21 VITALS — BP 136/68 | HR 64 | Ht 68.5 in | Wt 191.6 lb

## 2023-11-21 DIAGNOSIS — Z5181 Encounter for therapeutic drug level monitoring: Secondary | ICD-10-CM

## 2023-11-21 DIAGNOSIS — E1169 Type 2 diabetes mellitus with other specified complication: Secondary | ICD-10-CM

## 2023-11-21 DIAGNOSIS — E782 Mixed hyperlipidemia: Secondary | ICD-10-CM | POA: Diagnosis not present

## 2023-11-21 DIAGNOSIS — B353 Tinea pedis: Secondary | ICD-10-CM

## 2023-11-21 DIAGNOSIS — I10 Essential (primary) hypertension: Secondary | ICD-10-CM

## 2023-11-21 DIAGNOSIS — G4733 Obstructive sleep apnea (adult) (pediatric): Secondary | ICD-10-CM | POA: Diagnosis not present

## 2023-11-21 DIAGNOSIS — Z0000 Encounter for general adult medical examination without abnormal findings: Secondary | ICD-10-CM

## 2023-11-21 DIAGNOSIS — Z794 Long term (current) use of insulin: Secondary | ICD-10-CM

## 2023-11-25 ENCOUNTER — Other Ambulatory Visit

## 2023-11-25 DIAGNOSIS — E782 Mixed hyperlipidemia: Secondary | ICD-10-CM

## 2023-11-25 DIAGNOSIS — Z794 Long term (current) use of insulin: Secondary | ICD-10-CM

## 2023-11-25 DIAGNOSIS — Z5181 Encounter for therapeutic drug level monitoring: Secondary | ICD-10-CM

## 2023-11-25 DIAGNOSIS — I10 Essential (primary) hypertension: Secondary | ICD-10-CM

## 2023-11-26 LAB — CBC WITH DIFFERENTIAL/PLATELET
Basophils Absolute: 0 10*3/uL (ref 0.0–0.2)
Basos: 0 %
EOS (ABSOLUTE): 0.1 10*3/uL (ref 0.0–0.4)
Eos: 2 %
Hematocrit: 46.5 % (ref 37.5–51.0)
Hemoglobin: 15.3 g/dL (ref 13.0–17.7)
Immature Grans (Abs): 0 10*3/uL (ref 0.0–0.1)
Immature Granulocytes: 0 %
Lymphocytes Absolute: 1.3 10*3/uL (ref 0.7–3.1)
Lymphs: 28 %
MCH: 32.6 pg (ref 26.6–33.0)
MCHC: 32.9 g/dL (ref 31.5–35.7)
MCV: 99 fL — ABNORMAL HIGH (ref 79–97)
Monocytes Absolute: 0.3 10*3/uL (ref 0.1–0.9)
Monocytes: 8 %
Neutrophils Absolute: 2.8 10*3/uL (ref 1.4–7.0)
Neutrophils: 62 %
Platelets: 165 10*3/uL (ref 150–450)
RBC: 4.7 x10E6/uL (ref 4.14–5.80)
RDW: 12.7 % (ref 11.6–15.4)
WBC: 4.6 10*3/uL (ref 3.4–10.8)

## 2023-11-26 LAB — COMPREHENSIVE METABOLIC PANEL WITH GFR
ALT: 32 IU/L (ref 0–44)
AST: 30 IU/L (ref 0–40)
Albumin: 4.3 g/dL (ref 3.8–4.8)
Alkaline Phosphatase: 38 IU/L — ABNORMAL LOW (ref 44–121)
BUN/Creatinine Ratio: 17 (ref 10–24)
BUN: 20 mg/dL (ref 8–27)
Bilirubin Total: 1.1 mg/dL (ref 0.0–1.2)
CO2: 20 mmol/L (ref 20–29)
Calcium: 10 mg/dL (ref 8.6–10.2)
Chloride: 104 mmol/L (ref 96–106)
Creatinine, Ser: 1.18 mg/dL (ref 0.76–1.27)
Globulin, Total: 2 g/dL (ref 1.5–4.5)
Glucose: 125 mg/dL — ABNORMAL HIGH (ref 70–99)
Potassium: 4.2 mmol/L (ref 3.5–5.2)
Sodium: 140 mmol/L (ref 134–144)
Total Protein: 6.3 g/dL (ref 6.0–8.5)
eGFR: 64 mL/min/{1.73_m2} (ref >59)

## 2023-11-26 LAB — LIPID PANEL
Chol/HDL Ratio: 3 ratio (ref 0.0–5.0)
Cholesterol, Total: 123 mg/dL (ref 100–199)
HDL: 41 mg/dL (ref >39)
LDL Chol Calc (NIH): 65 mg/dL (ref 0–99)
Triglycerides: 88 mg/dL (ref 0–149)
VLDL Cholesterol Cal: 17 mg/dL (ref 5–40)

## 2023-11-26 LAB — MICROALBUMIN / CREATININE URINE RATIO
Creatinine, Urine: 66.6 mg/dL
Microalb/Creat Ratio: 10 mg/g{creat} (ref 0–29)
Microalbumin, Urine: 6.7 ug/mL

## 2023-11-26 LAB — TSH: TSH: 1.14 u[IU]/mL (ref 0.450–4.500)

## 2023-11-27 ENCOUNTER — Ambulatory Visit: Payer: Self-pay | Admitting: Family Medicine

## 2023-12-01 ENCOUNTER — Other Ambulatory Visit: Payer: Self-pay | Admitting: Family Medicine

## 2023-12-01 DIAGNOSIS — E782 Mixed hyperlipidemia: Secondary | ICD-10-CM

## 2023-12-01 DIAGNOSIS — I10 Essential (primary) hypertension: Secondary | ICD-10-CM

## 2023-12-26 ENCOUNTER — Other Ambulatory Visit: Payer: Self-pay | Admitting: Family Medicine

## 2023-12-26 DIAGNOSIS — E782 Mixed hyperlipidemia: Secondary | ICD-10-CM

## 2024-01-20 ENCOUNTER — Encounter: Payer: Self-pay | Admitting: Podiatry

## 2024-01-20 ENCOUNTER — Other Ambulatory Visit: Payer: Self-pay | Admitting: Family Medicine

## 2024-01-20 ENCOUNTER — Ambulatory Visit (INDEPENDENT_AMBULATORY_CARE_PROVIDER_SITE_OTHER): Admitting: Podiatry

## 2024-01-20 DIAGNOSIS — E1165 Type 2 diabetes mellitus with hyperglycemia: Secondary | ICD-10-CM | POA: Diagnosis not present

## 2024-01-20 DIAGNOSIS — Z794 Long term (current) use of insulin: Secondary | ICD-10-CM

## 2024-01-20 DIAGNOSIS — B351 Tinea unguium: Secondary | ICD-10-CM | POA: Diagnosis not present

## 2024-01-20 DIAGNOSIS — M79674 Pain in right toe(s): Secondary | ICD-10-CM

## 2024-01-20 DIAGNOSIS — M79675 Pain in left toe(s): Secondary | ICD-10-CM | POA: Diagnosis not present

## 2024-01-20 DIAGNOSIS — I10 Essential (primary) hypertension: Secondary | ICD-10-CM

## 2024-01-20 NOTE — Progress Notes (Signed)
 This patient returns to my office for at risk foot care.  This patient requires this care by a professional since this patient will be at risk due to having type 2 diabetes.  This patient is unable to cut nails himself since the patient cannot reach his nails.These nails are painful walking and wearing shoes.  This patient presents for at risk foot care today.  General Appearance  Alert, conversant and in no acute stress.  Vascular  Dorsalis pedis and posterior tibial  pulses are palpable  bilaterally.  Capillary return is within normal limits  bilaterally. Temperature is within normal limits  bilaterally.  Neurologic  Senn-Weinstein monofilament wire test within normal limits  bilaterally. Muscle power within normal limits bilaterally.  Nails Thick disfigured discolored nails with subungual debris  from hallux to fifth toes bilaterally. No evidence of bacterial infection or drainage bilaterally.  Orthopedic  No limitations of motion  feet .  No crepitus or effusions noted.  No bony pathology or digital deformities noted.  Skin  normotropic skin with no porokeratosis noted bilaterally.  No signs of infections or ulcers noted.    Onychomycosis  Pain in right toes  Pain in left toes    Consent was obtained for treatment procedures.   Mechanical debridement of nails 1-5  bilaterally performed with a nail nipper.  Filed with dremel without incident.  Prescribe lotrisone .   Return office visit    10 weeks                  Told patient to return for periodic foot care and evaluation due to potential at risk complications.   Ruffin Cotton DPM

## 2024-04-02 ENCOUNTER — Encounter: Payer: Self-pay | Admitting: Podiatry

## 2024-04-02 ENCOUNTER — Ambulatory Visit (INDEPENDENT_AMBULATORY_CARE_PROVIDER_SITE_OTHER): Admitting: Podiatry

## 2024-04-02 DIAGNOSIS — Z794 Long term (current) use of insulin: Secondary | ICD-10-CM

## 2024-04-02 DIAGNOSIS — B351 Tinea unguium: Secondary | ICD-10-CM | POA: Diagnosis not present

## 2024-04-02 DIAGNOSIS — E1165 Type 2 diabetes mellitus with hyperglycemia: Secondary | ICD-10-CM | POA: Diagnosis not present

## 2024-04-02 DIAGNOSIS — M79675 Pain in left toe(s): Secondary | ICD-10-CM | POA: Diagnosis not present

## 2024-04-02 DIAGNOSIS — M79674 Pain in right toe(s): Secondary | ICD-10-CM | POA: Diagnosis not present

## 2024-04-02 NOTE — Progress Notes (Signed)
 This patient returns to my office for at risk foot care.  This patient requires this care by a professional since this patient will be at risk due to having type 2 diabetes.  This patient is unable to cut nails himself since the patient cannot reach his nails.These nails are painful walking and wearing shoes.  This patient presents for at risk foot care today.  General Appearance  Alert, conversant and in no acute stress.  Vascular  Dorsalis pedis and posterior tibial  pulses are palpable  bilaterally.  Capillary return is within normal limits  bilaterally. Temperature is within normal limits  bilaterally.  Neurologic  Senn-Weinstein monofilament wire test within normal limits  bilaterally. Muscle power within normal limits bilaterally.  Nails Thick disfigured discolored nails with subungual debris  from hallux to fifth toes bilaterally. No evidence of bacterial infection or drainage bilaterally.  Orthopedic  No limitations of motion  feet .  No crepitus or effusions noted.  No bony pathology or digital deformities noted.  Skin  normotropic skin with no porokeratosis noted bilaterally.  No signs of infections or ulcers noted.    Onychomycosis  Pain in right toes  Pain in left toes    Consent was obtained for treatment procedures.   Mechanical debridement of nails 1-5  bilaterally performed with a nail nipper.  Filed with dremel without incident.  Prescribe lotrisone .   Return office visit    10 weeks                  Told patient to return for periodic foot care and evaluation due to potential at risk complications.   Ruffin Cotton DPM

## 2024-04-03 LAB — HEMOGLOBIN A1C: Hemoglobin A1C: 8

## 2024-04-04 ENCOUNTER — Encounter: Payer: Self-pay | Admitting: *Deleted

## 2024-04-09 ENCOUNTER — Other Ambulatory Visit: Payer: Self-pay | Admitting: Medical Genetics

## 2024-04-09 DIAGNOSIS — Z006 Encounter for examination for normal comparison and control in clinical research program: Secondary | ICD-10-CM

## 2024-04-17 ENCOUNTER — Other Ambulatory Visit: Payer: Self-pay | Admitting: Family Medicine

## 2024-04-17 DIAGNOSIS — Z794 Long term (current) use of insulin: Secondary | ICD-10-CM

## 2024-04-17 DIAGNOSIS — I10 Essential (primary) hypertension: Secondary | ICD-10-CM

## 2024-04-30 ENCOUNTER — Other Ambulatory Visit: Payer: Self-pay | Admitting: Family Medicine

## 2024-04-30 DIAGNOSIS — E782 Mixed hyperlipidemia: Secondary | ICD-10-CM

## 2024-05-24 ENCOUNTER — Encounter: Payer: Self-pay | Admitting: Family Medicine

## 2024-05-27 NOTE — Progress Notes (Unsigned)
 No chief complaint on file.  Patient presents for 6 month follow-up on chronic problems.  Hypertension follow-up: He is taking amlodipine  and Losartan .  He denies any side effects. He denies headaches, dizziness, chest pain, palpitations, edema or shortness of breath. Doesn't check BP elsewhere.  States BP's are *** at other doctors.   BP Readings from Last 3 Encounters:  11/21/23 136/68  11/15/22 120/76  04/15/22 132/86    Hyperlipidemia follow-up: Patient is reportedly following a low-fat, low cholesterol diet. Compliant with taking his Crestor , Tricor , and 3 fish oil daily (for his dry eyes), and denies medication side effects.  He eats red meat 2x/week, limits fried foods. Lipids were at goal on last check.  Lab Results  Component Value Date   CHOL 123 11/25/2023   HDL 41 11/25/2023   LDLCALC 65 11/25/2023   TRIG 88 11/25/2023   CHOLHDL 3.0 11/25/2023     Diabetes is managed by Dr. Faythe.  Last A1c was 8% in 03/2024 with Dr. Faythe, had been 6.6% in April.  He continues on ozempic, farxiga , Novolog with meals and Tresiba.   Rare hypoglycemia Denies polydipsia, polyuria.  Gets routine care at podiatrist. He denies any neuropathy or lesions. Last diabetic eye exam was 12/2022. ***UPDATE He sees Dr. Meridee for dry eyes, doing well. Getting a procedure monthly (in a trial out of [place]). This has been effective. ***    OSA: Uses CPAP machine nightly. Denies any problems, tolerating it well. Feels refreshed when he wakes up. He continues to get benefit from CPAP use.    Obesity:  He is now on Ozempic through endo. He denies side effects.  He remains on the 0.5 mg dose. He limits portions of rice/carbs. Doesn't eat much desserts. He has 1 drink a night, 1.5 on the weekends.  Has a beer after golf, only in the summer. Exercise:  walks daily; uses handweights 3x/week.     PMH, PSH, SH reviewed    ROS: no fever, chills, URI symptoms. No nausea, vomiting, diarrhea,  abdominal pain or urinary complaints. No headaches, dizziness, chest pain, shortness of breath or edema. No skin rashes, bleeding/bruising. No joint pains  No change to mass at posterior neck or below jaw on L. Moods are good Weight ***    PHYSICAL EXAM:  There were no vitals taken for this visit.  Wt Readings from Last 3 Encounters:  11/21/23 191 lb 9.6 oz (86.9 kg)  11/15/22 195 lb 12.8 oz (88.8 kg)  08/23/22 203 lb (92.1 kg)    Well developed, pleasant, overweight male in no distress, in good spirits HEENT: PERRL, EOMI, conjunctiva and sclera are clear. Neck: no lymphadenopathy, thyromegaly or carotid bruit. Soft tissue mass L posterior neck and at L lower jaw, below L ear, unchanged, nontender Heart: regular rate and rhythm Lungs: clear bilaterally Back: no CVA tenderness, SI or spinal tenderness Abdomen: soft, nontender, no organomegaly or mass. Central obesity Extremities: no edema, normal pulses.  Psych: normal mood, affect, hygiene and grooming Neuro: alert and oriented, normal gait Skin: no visible rashes, normal turgor.    ASSESSMENT/PLAN:  Never got last eye exam (last we have was 12/2022)---please get  Did he ever get Tdap or shingrix from pharmacy?  Offer/decline COVID prevnar-20 today   F/u as scheduled for annual exam in 11/2024

## 2024-05-28 ENCOUNTER — Encounter: Payer: Self-pay | Admitting: Family Medicine

## 2024-05-28 ENCOUNTER — Ambulatory Visit: Payer: Self-pay | Admitting: Family Medicine

## 2024-05-28 VITALS — BP 130/80 | HR 72 | Ht 68.5 in | Wt 190.0 lb

## 2024-05-28 DIAGNOSIS — Z7185 Encounter for immunization safety counseling: Secondary | ICD-10-CM

## 2024-05-28 DIAGNOSIS — G4733 Obstructive sleep apnea (adult) (pediatric): Secondary | ICD-10-CM | POA: Diagnosis not present

## 2024-05-28 DIAGNOSIS — Z23 Encounter for immunization: Secondary | ICD-10-CM

## 2024-05-28 DIAGNOSIS — E1165 Type 2 diabetes mellitus with hyperglycemia: Secondary | ICD-10-CM

## 2024-05-28 DIAGNOSIS — Z794 Long term (current) use of insulin: Secondary | ICD-10-CM

## 2024-05-28 DIAGNOSIS — I10 Essential (primary) hypertension: Secondary | ICD-10-CM

## 2024-05-28 DIAGNOSIS — E782 Mixed hyperlipidemia: Secondary | ICD-10-CM | POA: Diagnosis not present

## 2024-05-28 DIAGNOSIS — Z6828 Body mass index (BMI) 28.0-28.9, adult: Secondary | ICD-10-CM

## 2024-05-28 DIAGNOSIS — Z5181 Encounter for therapeutic drug level monitoring: Secondary | ICD-10-CM

## 2024-05-28 MED ORDER — AMLODIPINE BESYLATE 5 MG PO TABS: 5.0000 mg | ORAL_TABLET | Freq: Every day | ORAL | 1 refills | Status: DC

## 2024-05-28 MED ORDER — ROSUVASTATIN CALCIUM 20 MG PO TABS: 20.0000 mg | ORAL_TABLET | Freq: Every day | ORAL | 1 refills | Status: DC

## 2024-05-28 MED ORDER — FENOFIBRATE 145 MG PO TABS: 145.0000 mg | ORAL_TABLET | Freq: Every day | ORAL | 1 refills | Status: AC

## 2024-05-28 NOTE — Patient Instructions (Addendum)
 You are past due to get your tetanus booster (TdaP).  You need to get this from the pharmacy (covered by your insurance with no cost if you get it there).  I also recommend getting the shingles vaccines (2 shots, given 2 months apart) from the pharmacy. I would get this separate from any other vaccines (by 2 weeks).  Prevnar-20 (a type of pneumonia vaccine) is recommended.  You can either get this from a nurse visit at our office, or from the pharmacy.    We discussed that your A1c was above goal, at 8% at your last visit with Dr. Faythe. Please try and monitor blood sugars, and follow up with him sooner than April if they are consistently elevated.  Consider increasing the ozempic (rather than the insulin) to help with both the sugars, and further weight loss.

## 2024-05-30 ENCOUNTER — Other Ambulatory Visit: Payer: Self-pay | Admitting: Family Medicine

## 2024-05-30 DIAGNOSIS — I10 Essential (primary) hypertension: Secondary | ICD-10-CM

## 2024-05-30 DIAGNOSIS — E782 Mixed hyperlipidemia: Secondary | ICD-10-CM

## 2024-06-25 ENCOUNTER — Ambulatory Visit: Admitting: Podiatry

## 2024-06-25 ENCOUNTER — Encounter: Payer: Self-pay | Admitting: Podiatry

## 2024-06-25 DIAGNOSIS — Z794 Long term (current) use of insulin: Secondary | ICD-10-CM

## 2024-06-25 DIAGNOSIS — B351 Tinea unguium: Secondary | ICD-10-CM | POA: Diagnosis not present

## 2024-06-25 DIAGNOSIS — M79674 Pain in right toe(s): Secondary | ICD-10-CM | POA: Diagnosis not present

## 2024-06-25 DIAGNOSIS — E1165 Type 2 diabetes mellitus with hyperglycemia: Secondary | ICD-10-CM | POA: Diagnosis not present

## 2024-06-25 DIAGNOSIS — M79675 Pain in left toe(s): Secondary | ICD-10-CM

## 2024-06-25 DIAGNOSIS — N1831 Chronic kidney disease, stage 3a: Secondary | ICD-10-CM

## 2024-06-25 NOTE — Progress Notes (Signed)
 This patient returns to my office for at risk foot care.  This patient requires this care by a professional since this patient will be at risk due to having type 2 diabetes.  This patient is unable to cut nails himself since the patient cannot reach his nails.These nails are painful walking and wearing shoes.  This patient presents for at risk foot care today.  General Appearance  Alert, conversant and in no acute stress.  Vascular  Dorsalis pedis and posterior tibial  pulses are palpable  bilaterally.  Capillary return is within normal limits  bilaterally. Temperature is within normal limits  bilaterally.  Neurologic  Senn-Weinstein monofilament wire test within normal limits  bilaterally. Muscle power within normal limits bilaterally.  Nails Thick disfigured discolored nails with subungual debris  from hallux to fifth toes bilaterally. No evidence of bacterial infection or drainage bilaterally.  Orthopedic  No limitations of motion  feet .  No crepitus or effusions noted.  No bony pathology or digital deformities noted.  Skin  normotropic skin with no porokeratosis noted bilaterally.  No signs of infections or ulcers noted.    Onychomycosis  Pain in right toes  Pain in left toes    Consent was obtained for treatment procedures.   Mechanical debridement of nails 1-5  bilaterally performed with a nail nipper.  Filed with dremel without incident.  Prescribe lotrisone .   Return office visit    10 weeks                  Told patient to return for periodic foot care and evaluation due to potential at risk complications.   Ruffin Cotton DPM

## 2024-09-03 ENCOUNTER — Ambulatory Visit: Admitting: Podiatry

## 2024-12-03 ENCOUNTER — Encounter: Payer: Self-pay | Admitting: Family Medicine
